# Patient Record
Sex: Female | Born: 1985 | Race: Black or African American | Hispanic: No | Marital: Single | Smoking: Never smoker
Health system: Southern US, Community
[De-identification: ages and names within clinical notes are randomized; demographics above are authoritative.]

## PROBLEM LIST (undated history)

## (undated) ENCOUNTER — Inpatient Hospital Stay (HOSPITAL_COMMUNITY): Payer: Self-pay

## (undated) DIAGNOSIS — C801 Malignant (primary) neoplasm, unspecified: Secondary | ICD-10-CM

## (undated) DIAGNOSIS — I10 Essential (primary) hypertension: Secondary | ICD-10-CM

## (undated) DIAGNOSIS — E079 Disorder of thyroid, unspecified: Secondary | ICD-10-CM

## (undated) DIAGNOSIS — F419 Anxiety disorder, unspecified: Secondary | ICD-10-CM

---
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
## 2005-12-30 ENCOUNTER — Emergency Department (HOSPITAL_COMMUNITY): Admission: EM | Admit: 2005-12-30 | Discharge: 2005-12-30 | Payer: Self-pay | Admitting: Emergency Medicine

## 2006-01-28 ENCOUNTER — Emergency Department (HOSPITAL_COMMUNITY): Admission: EM | Admit: 2006-01-28 | Discharge: 2006-01-28 | Payer: Self-pay | Admitting: Emergency Medicine

## 2006-02-07 ENCOUNTER — Emergency Department (HOSPITAL_COMMUNITY): Admission: EM | Admit: 2006-02-07 | Discharge: 2006-02-07 | Payer: Self-pay | Admitting: Emergency Medicine

## 2006-02-14 ENCOUNTER — Emergency Department (HOSPITAL_COMMUNITY): Admission: EM | Admit: 2006-02-14 | Discharge: 2006-02-15 | Payer: Self-pay | Admitting: Emergency Medicine

## 2006-02-20 ENCOUNTER — Emergency Department (HOSPITAL_COMMUNITY): Admission: EM | Admit: 2006-02-20 | Discharge: 2006-02-20 | Payer: Self-pay | Admitting: Emergency Medicine

## 2006-05-10 ENCOUNTER — Emergency Department (HOSPITAL_COMMUNITY): Admission: EM | Admit: 2006-05-10 | Discharge: 2006-05-10 | Payer: Self-pay | Admitting: Family Medicine

## 2006-06-18 ENCOUNTER — Emergency Department (HOSPITAL_COMMUNITY): Admission: EM | Admit: 2006-06-18 | Discharge: 2006-06-18 | Payer: Self-pay | Admitting: Emergency Medicine

## 2006-09-13 ENCOUNTER — Emergency Department (HOSPITAL_COMMUNITY): Admission: EM | Admit: 2006-09-13 | Discharge: 2006-09-13 | Payer: Self-pay | Admitting: Emergency Medicine

## 2006-10-19 ENCOUNTER — Emergency Department (HOSPITAL_COMMUNITY): Admission: EM | Admit: 2006-10-19 | Discharge: 2006-10-19 | Payer: Self-pay | Admitting: Family Medicine

## 2006-10-25 ENCOUNTER — Emergency Department (HOSPITAL_COMMUNITY): Admission: EM | Admit: 2006-10-25 | Discharge: 2006-10-25 | Payer: Self-pay | Admitting: Emergency Medicine

## 2007-01-15 ENCOUNTER — Emergency Department (HOSPITAL_COMMUNITY): Admission: EM | Admit: 2007-01-15 | Discharge: 2007-01-15 | Payer: Self-pay | Admitting: Family Medicine

## 2007-06-12 ENCOUNTER — Emergency Department (HOSPITAL_COMMUNITY): Admission: EM | Admit: 2007-06-12 | Discharge: 2007-06-13 | Payer: Self-pay | Admitting: Emergency Medicine

## 2007-07-05 ENCOUNTER — Emergency Department (HOSPITAL_COMMUNITY): Admission: EM | Admit: 2007-07-05 | Discharge: 2007-07-05 | Payer: Self-pay | Admitting: Emergency Medicine

## 2007-09-10 ENCOUNTER — Emergency Department (HOSPITAL_COMMUNITY): Admission: EM | Admit: 2007-09-10 | Discharge: 2007-09-10 | Payer: Self-pay | Admitting: Family Medicine

## 2007-12-14 ENCOUNTER — Emergency Department (HOSPITAL_COMMUNITY): Admission: EM | Admit: 2007-12-14 | Discharge: 2007-12-14 | Payer: Self-pay | Admitting: Emergency Medicine

## 2007-12-22 ENCOUNTER — Emergency Department (HOSPITAL_COMMUNITY): Admission: EM | Admit: 2007-12-22 | Discharge: 2007-12-22 | Payer: Self-pay | Admitting: Family Medicine

## 2008-01-24 ENCOUNTER — Emergency Department (HOSPITAL_COMMUNITY): Admission: EM | Admit: 2008-01-24 | Discharge: 2008-01-24 | Payer: Self-pay | Admitting: Emergency Medicine

## 2008-03-02 ENCOUNTER — Emergency Department (HOSPITAL_COMMUNITY): Admission: EM | Admit: 2008-03-02 | Discharge: 2008-03-02 | Payer: Self-pay | Admitting: Emergency Medicine

## 2008-03-03 ENCOUNTER — Observation Stay (HOSPITAL_COMMUNITY): Admission: EM | Admit: 2008-03-03 | Discharge: 2008-03-03 | Payer: Self-pay | Admitting: Family Medicine

## 2008-06-17 ENCOUNTER — Emergency Department (HOSPITAL_COMMUNITY): Admission: EM | Admit: 2008-06-17 | Discharge: 2008-06-17 | Payer: Self-pay | Admitting: Emergency Medicine

## 2009-04-27 ENCOUNTER — Emergency Department (HOSPITAL_COMMUNITY): Admission: EM | Admit: 2009-04-27 | Discharge: 2009-04-27 | Payer: Self-pay | Admitting: Emergency Medicine

## 2009-11-14 ENCOUNTER — Emergency Department (HOSPITAL_COMMUNITY): Admission: EM | Admit: 2009-11-14 | Discharge: 2009-11-14 | Payer: Self-pay | Admitting: Family Medicine

## 2010-01-31 ENCOUNTER — Emergency Department (HOSPITAL_COMMUNITY): Admission: EM | Admit: 2010-01-31 | Discharge: 2010-01-31 | Payer: Self-pay | Admitting: Emergency Medicine

## 2010-06-18 ENCOUNTER — Emergency Department (HOSPITAL_COMMUNITY): Admission: EM | Admit: 2010-06-18 | Discharge: 2010-06-19 | Payer: Self-pay | Admitting: Emergency Medicine

## 2010-07-11 ENCOUNTER — Emergency Department (HOSPITAL_COMMUNITY): Admission: EM | Admit: 2010-07-11 | Discharge: 2010-04-25 | Payer: Self-pay | Admitting: Emergency Medicine

## 2010-09-27 ENCOUNTER — Inpatient Hospital Stay (INDEPENDENT_AMBULATORY_CARE_PROVIDER_SITE_OTHER)
Admission: RE | Admit: 2010-09-27 | Discharge: 2010-09-27 | Disposition: A | Discharge status: Home / Self Care | Payer: 59 | Source: Ambulatory Visit | Attending: Emergency Medicine | Admitting: Emergency Medicine

## 2010-09-27 DIAGNOSIS — K089 Disorder of teeth and supporting structures, unspecified: Secondary | ICD-10-CM

## 2010-10-15 LAB — RAPID URINE DRUG SCREEN, HOSP PERFORMED
Amphetamines: POSITIVE — AB
Barbiturates: NOT DETECTED
Benzodiazepines: NOT DETECTED
Cocaine: NOT DETECTED
Opiates: NOT DETECTED
Tetrahydrocannabinol: NOT DETECTED

## 2010-10-15 LAB — URINE MICROSCOPIC-ADD ON

## 2010-10-15 LAB — CBC
HCT: 34.3 % — ABNORMAL LOW (ref 36.0–46.0)
Hemoglobin: 11.8 g/dL — ABNORMAL LOW (ref 12.0–15.0)
MCH: 29.4 pg (ref 26.0–34.0)
MCHC: 34.4 g/dL (ref 30.0–36.0)
MCV: 85.5 fL (ref 78.0–100.0)
Platelets: 223 10*3/uL (ref 150–400)
RBC: 4.01 MIL/uL (ref 3.87–5.11)
RDW: 12.1 % (ref 11.5–15.5)
WBC: 8.9 10*3/uL (ref 4.0–10.5)

## 2010-10-15 LAB — URINALYSIS, ROUTINE W REFLEX MICROSCOPIC
Bilirubin Urine: NEGATIVE
Glucose, UA: NEGATIVE mg/dL
Ketones, ur: NEGATIVE mg/dL
Leukocytes, UA: NEGATIVE
Nitrite: POSITIVE — AB
Protein, ur: NEGATIVE mg/dL
Specific Gravity, Urine: 1.034 — ABNORMAL HIGH (ref 1.005–1.030)
Urobilinogen, UA: 1 mg/dL (ref 0.0–1.0)
pH: 6 (ref 5.0–8.0)

## 2010-10-15 LAB — BASIC METABOLIC PANEL
BUN: 9 mg/dL (ref 6–23)
CO2: 26 mEq/L (ref 19–32)
Calcium: 8.9 mg/dL (ref 8.4–10.5)
Chloride: 108 mEq/L (ref 96–112)
Creatinine, Ser: 0.66 mg/dL (ref 0.4–1.2)
GFR calc Af Amer: >60 mL/min (ref >60)
GFR calc non Af Amer: >60 mL/min (ref >60)
Glucose, Bld: 117 mg/dL — ABNORMAL HIGH (ref 70–99)
Potassium: 3.6 mEq/L (ref 3.5–5.1)
Sodium: 139 mEq/L (ref 135–145)

## 2010-10-15 LAB — DIFFERENTIAL
Lymphs Abs: 2.6 10*3/uL (ref 0.7–4.0)
Monocytes Relative: 8 % (ref 3–12)
Neutro Abs: 5.6 10*3/uL (ref 1.7–7.7)
Neutrophils Relative %: 62 % (ref 43–77)

## 2010-11-08 LAB — WET PREP, GENITAL
Trich, Wet Prep: NONE SEEN
Yeast Wet Prep HPF POC: NONE SEEN

## 2010-11-08 LAB — POCT PREGNANCY, URINE: Preg Test, Ur: NEGATIVE

## 2010-11-08 LAB — GC/CHLAMYDIA PROBE AMP, GENITAL
Chlamydia, DNA Probe: NEGATIVE
GC Probe Amp, Genital: NEGATIVE

## 2010-11-20 ENCOUNTER — Emergency Department (HOSPITAL_COMMUNITY)
Admission: EM | Admit: 2010-11-20 | Discharge: 2010-11-20 | Disposition: A | Discharge status: Home / Self Care | Payer: Medicaid Other | Attending: Emergency Medicine | Admitting: Emergency Medicine

## 2010-11-20 ENCOUNTER — Inpatient Hospital Stay (INDEPENDENT_AMBULATORY_CARE_PROVIDER_SITE_OTHER)
Admission: RE | Admit: 2010-11-20 | Discharge: 2010-11-20 | Disposition: A | Discharge status: Home / Self Care | Payer: Self-pay | Source: Ambulatory Visit | Attending: Family Medicine | Admitting: Family Medicine

## 2010-11-20 DIAGNOSIS — G40802 Other epilepsy, not intractable, without status epilepticus: Secondary | ICD-10-CM | POA: Insufficient documentation

## 2010-11-20 DIAGNOSIS — K047 Periapical abscess without sinus: Secondary | ICD-10-CM

## 2010-11-20 DIAGNOSIS — K089 Disorder of teeth and supporting structures, unspecified: Secondary | ICD-10-CM | POA: Insufficient documentation

## 2010-11-20 DIAGNOSIS — G43909 Migraine, unspecified, not intractable, without status migrainosus: Secondary | ICD-10-CM | POA: Insufficient documentation
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
## 2011-04-30 LAB — POCT URINALYSIS DIP (DEVICE)
Bilirubin Urine: NEGATIVE
Ketones, ur: NEGATIVE
pH: 6.5

## 2011-04-30 LAB — WET PREP, GENITAL
Trich, Wet Prep: NONE SEEN
Yeast Wet Prep HPF POC: NONE SEEN

## 2011-04-30 LAB — URINE CULTURE

## 2011-05-02 LAB — COMPREHENSIVE METABOLIC PANEL
AST: 11
Albumin: 3.7
Alkaline Phosphatase: 63
BUN: 8
GFR calc Af Amer: >60
Potassium: 3.7
Sodium: 135
Total Protein: 6.6

## 2011-05-02 LAB — CBC
Hemoglobin: 12.3
RBC: 4.07
RDW: 12.6
WBC: 6.6

## 2011-05-02 LAB — BASIC METABOLIC PANEL
BUN: 10
CO2: 25
Calcium: 9.2
Chloride: 102
Creatinine, Ser: 0.65
GFR calc Af Amer: >60

## 2011-05-02 LAB — URINALYSIS, ROUTINE W REFLEX MICROSCOPIC
Glucose, UA: NEGATIVE
Hgb urine dipstick: NEGATIVE
Ketones, ur: 15 — AB
Protein, ur: NEGATIVE
Urobilinogen, UA: 1

## 2011-05-02 LAB — POCT I-STAT, CHEM 8
BUN: 10
Calcium, Ion: 1.11 — ABNORMAL LOW
Creatinine, Ser: 1
Hemoglobin: 12.9
TCO2: 24

## 2011-05-02 LAB — DIFFERENTIAL
Eosinophils Absolute: 0.1
Lymphs Abs: 1.8
Monocytes Relative: 8
Neutrophils Relative %: 64

## 2011-05-02 LAB — POCT PREGNANCY, URINE: Preg Test, Ur: NEGATIVE

## 2011-05-02 LAB — CK TOTAL AND CKMB (NOT AT ARMC)
CK, MB: 0.8
Total CK: 72

## 2011-05-02 LAB — ANA: Anti Nuclear Antibody(ANA): POSITIVE — AB

## 2011-05-02 LAB — ANTI-NUCLEAR AB-TITER (ANA TITER)

## 2011-05-06 LAB — URINE MICROSCOPIC-ADD ON

## 2011-05-06 LAB — CBC
HCT: 36.5
MCHC: 34.4
MCV: 89
Platelets: 243
RBC: 4.1
WBC: 8.6

## 2011-05-06 LAB — URINALYSIS, ROUTINE W REFLEX MICROSCOPIC
Glucose, UA: NEGATIVE
Ketones, ur: NEGATIVE
Leukocytes, UA: NEGATIVE
pH: 6.5

## 2011-05-06 LAB — DIFFERENTIAL
Basophils Absolute: 0
Basophils Relative: 0
Lymphs Abs: 2.9
Monocytes Absolute: 0.8

## 2011-05-06 LAB — BASIC METABOLIC PANEL
BUN: 14
CO2: 27
Chloride: 107
Potassium: 4

## 2011-05-09 ENCOUNTER — Inpatient Hospital Stay (INDEPENDENT_AMBULATORY_CARE_PROVIDER_SITE_OTHER)
Admission: RE | Admit: 2011-05-09 | Discharge: 2011-05-09 | Disposition: A | Discharge status: Home / Self Care | Payer: Self-pay | Source: Ambulatory Visit | Attending: Emergency Medicine | Admitting: Emergency Medicine

## 2011-05-09 DIAGNOSIS — K089 Disorder of teeth and supporting structures, unspecified: Secondary | ICD-10-CM

## 2011-05-13 LAB — D-DIMER, QUANTITATIVE: D-Dimer, Quant: 0.34

## 2011-05-13 LAB — I-STAT 8, (EC8 V) (CONVERTED LAB)
Acid-base deficit: 3 — ABNORMAL HIGH
Bicarbonate: 22.9
Chloride: 108
HCT: 37
Operator id: 234501
TCO2: 24
pCO2, Ven: 41.2 — ABNORMAL LOW
pH, Ven: 7.352 — ABNORMAL HIGH

## 2011-05-13 LAB — POCT I-STAT CREATININE
Creatinine, Ser: 0.8
Operator id: 234501

## 2011-05-13 LAB — POCT CARDIAC MARKERS: Troponin i, poc: <0.05

## 2011-05-22 LAB — POCT URINALYSIS DIP (DEVICE)
Glucose, UA: NEGATIVE
Ketones, ur: NEGATIVE
Nitrite: NEGATIVE
Operator id: 116391
Protein, ur: 30 — AB
Specific Gravity, Urine: >=1.03
Urobilinogen, UA: 1
pH: 6

## 2011-05-22 LAB — POCT PREGNANCY, URINE
Operator id: 116391
Preg Test, Ur: POSITIVE

## 2011-10-18 ENCOUNTER — Encounter (HOSPITAL_COMMUNITY): Payer: Self-pay

## 2011-10-18 ENCOUNTER — Emergency Department (INDEPENDENT_AMBULATORY_CARE_PROVIDER_SITE_OTHER)
Admission: EM | Admit: 2011-10-18 | Discharge: 2011-10-18 | Disposition: A | Discharge status: Home / Self Care | Payer: Self-pay | Source: Home / Self Care | Attending: Emergency Medicine | Admitting: Emergency Medicine

## 2011-10-18 DIAGNOSIS — IMO0002 Reserved for concepts with insufficient information to code with codable children: Secondary | ICD-10-CM

## 2011-10-18 DIAGNOSIS — S239XXA Sprain of unspecified parts of thorax, initial encounter: Secondary | ICD-10-CM

## 2011-10-18 DIAGNOSIS — H579 Unspecified disorder of eye and adnexa: Secondary | ICD-10-CM

## 2011-10-18 MED ORDER — IBUPROFEN 800 MG PO TABS: 800.0000 mg | ORAL_TABLET | Freq: Three times a day (TID) | ORAL | Status: AC

## 2011-10-18 MED ORDER — CYCLOBENZAPRINE HCL 10 MG PO TABS: 10.0000 mg | ORAL_TABLET | Freq: Two times a day (BID) | ORAL | Status: AC | PRN

## 2011-10-18 NOTE — Discharge Instructions (Signed)
Visual Disturbances   You have had a disturbance in your vision. This may be caused by various conditions, such as:   Migraines. Migraine headaches are often preceded by a disturbance in vision. Blind spots or light flashes are followed by a headache. This type of visual disturbance is temporary. It does not damage the eye.   Glaucoma. This is caused by increased pressure in the eye. Symptoms include haziness, blurred vision, or seeing rainbow colored circles when looking at bright lights. Partial or complete visual loss can occur. You may or may not experience eye pain. Visual loss may be gradual or sudden and is irreversible. Glaucoma is the leading cause of blindness.   Retina problems. Vision will be reduced if the retina becomes detached or if there is a circulation problem as with diabetes, high blood pressure, or a mini-stroke. Symptoms include seeing "floaters," flashes of light, or shadows, as if a curtain has fallen over your eye.   Optic nerve problems. The main nerve in your eye can be damaged by redness, soreness, and swelling (inflammation), poor circulation, drugs, and toxins.  It is very important to have a complete exam done by a specialist to determine the exact cause of your eye problem. The specialist may recommend medicines or surgery, depending on the cause of the problem. This can help prevent further loss of vision or reduce the risk of having a stroke. Contact the caregiver to whom you have been referred and arrange for follow-up care right away.   SEEK IMMEDIATE MEDICAL CARE IF:   Your vision gets worse.   You develop severe headaches.   You have any weakness or numbness in the face, arms, or legs.   You have any trouble speaking or walking.  Document Released: 08/28/2004 Document Revised: 07/10/2011 Document Reviewed: 12/19/2009   [employer] Patient Information 2012 [employer].
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
## 2012-02-02 DIAGNOSIS — C801 Malignant (primary) neoplasm, unspecified: Secondary | ICD-10-CM

## 2012-02-02 HISTORY — PX: THYROIDECTOMY: SHX17

## 2012-02-02 HISTORY — DX: Malignant (primary) neoplasm, unspecified: C80.1

## 2012-04-15 ENCOUNTER — Other Ambulatory Visit (HOSPITAL_COMMUNITY): Payer: Self-pay | Admitting: Endocrinology

## 2012-04-15 DIAGNOSIS — C73 Malignant neoplasm of thyroid gland: Secondary | ICD-10-CM

## 2012-04-19 ENCOUNTER — Other Ambulatory Visit (HOSPITAL_COMMUNITY): Payer: Self-pay | Admitting: Endocrinology

## 2012-04-19 DIAGNOSIS — C73 Malignant neoplasm of thyroid gland: Secondary | ICD-10-CM

## 2012-04-23 ENCOUNTER — Ambulatory Visit (HOSPITAL_COMMUNITY)
Admission: RE | Admit: 2012-04-23 | Discharge: 2012-04-23 | Disposition: A | Discharge status: Home / Self Care | Payer: Medicaid Other | Source: Ambulatory Visit | Attending: Endocrinology | Admitting: Endocrinology

## 2012-04-23 DIAGNOSIS — C73 Malignant neoplasm of thyroid gland: Secondary | ICD-10-CM | POA: Insufficient documentation

## 2012-04-23 MED ORDER — SODIUM IODIDE I 131 CAPSULE
52.0000 | Freq: Once | INTRAVENOUS | Status: AC | PRN
  Administered 2012-04-23: 52 via ORAL

## 2012-05-04 ENCOUNTER — Encounter (HOSPITAL_COMMUNITY)
Admission: RE | Admit: 2012-05-04 | Discharge: 2012-05-04 | Disposition: A | Discharge status: Home / Self Care | Payer: Medicaid Other | Source: Ambulatory Visit | Attending: Endocrinology | Admitting: Endocrinology

## 2012-05-04 DIAGNOSIS — C73 Malignant neoplasm of thyroid gland: Secondary | ICD-10-CM | POA: Insufficient documentation

## 2013-05-26 ENCOUNTER — Encounter (HOSPITAL_COMMUNITY): Payer: Self-pay | Admitting: Emergency Medicine

## 2013-05-26 ENCOUNTER — Emergency Department (HOSPITAL_COMMUNITY)
Admission: EM | Admit: 2013-05-26 | Discharge: 2013-05-26 | Disposition: A | Discharge status: Home / Self Care | Payer: Medicaid Other | Source: Home / Self Care | Attending: Family Medicine | Admitting: Family Medicine

## 2013-05-26 DIAGNOSIS — M545 Low back pain: Secondary | ICD-10-CM

## 2013-05-26 HISTORY — DX: Malignant (primary) neoplasm, unspecified: C80.1

## 2013-05-26 HISTORY — DX: Disorder of thyroid, unspecified: E07.9

## 2013-05-26 LAB — POCT URINALYSIS DIP (DEVICE)
Bilirubin Urine: NEGATIVE
Ketones, ur: NEGATIVE mg/dL
Leukocytes, UA: NEGATIVE
Nitrite: NEGATIVE
pH: 6 (ref 5.0–8.0)

## 2013-05-26 LAB — POCT PREGNANCY, URINE: Preg Test, Ur: NEGATIVE

## 2013-05-26 MED ORDER — MELOXICAM 15 MG PO TABS: 15.0000 mg | ORAL_TABLET | Freq: Every day | ORAL | Status: DC | PRN

## 2013-05-26 MED ORDER — CYCLOBENZAPRINE HCL 10 MG PO TABS: 10.0000 mg | ORAL_TABLET | Freq: Every evening | ORAL | Status: DC | PRN

## 2013-05-26 NOTE — ED Provider Notes (Signed)
[name] is a 27 y.o. female who presents to Urgent Care today for right low back pain. Pain started today about 10:00 in the morning without injury. She notes moderate low back pain worse with intensity. She denies any radiating pain weakness numbness bowel bladder dysfunction or difficulty walking. She notes urinary frequency but denies any abdominal pain dysuria or urgency. She denies any vaginal discharge. Her last period was about one week ago. No fevers or chills.    Past Medical History  Diagnosis Date  . Thyroid disease   . Cancer 02/2012    thyroid cancer   History  Substance Use Topics  . Smoking status: Never Smoker   . Smokeless tobacco: Not on file  . Alcohol Use: No   ROS as above Medications reviewed. No current facility-administered medications for this encounter.   Current Outpatient Prescriptions  Medication Sig Dispense Refill  . levothyroxine (SYNTHROID, LEVOTHROID) 200 MCG tablet Take 200 mcg by mouth daily before breakfast.      . phentermine 37.5 MG capsule Take 37.5 mg by mouth every morning.      . cyclobenzaprine (FLEXERIL) 10 MG tablet Take 1 tablet (10 mg total) by mouth at bedtime as needed for muscle spasms.  20 tablet  0  . meloxicam (MOBIC) 15 MG tablet Take 1 tablet (15 mg total) by mouth daily as needed for pain.  30 tablet  0    Exam:  BP 143/83  Pulse 101  Temp(Src) 97.5 F (36.4 C) (Oral)  Resp 16  SpO2 100%  LMP 05/19/2013 Gen: Well NAD HEENT: EOMI,  MMM Lungs: CTABL Nl WOB Heart: RRR no MRG Abd: NABS, NT, ND Exts: Non edematous BL  LE, warm and well perfused.  Back: Nontender spinal midline. Tender palpation right SI joint. Negative straight leg raise test ,and  Negative Faber test laterally. Gait is normal. Patient is able to stand and get on and off exam table by herself.  Results for orders placed during the hospital encounter of 05/26/13 (from the past 24 hour(s))  POCT URINALYSIS DIP (DEVICE)     Status: Abnormal   Collection Time    05/26/13  7:41 PM      Result Value Range   Glucose, UA NEGATIVE  NEGATIVE mg/dL   Bilirubin Urine NEGATIVE  NEGATIVE   Ketones, ur NEGATIVE  NEGATIVE mg/dL   Specific Gravity, Urine >=1.030  1.005 - 1.030   Hgb urine dipstick TRACE (*) NEGATIVE   pH 6.0  5.0 - 8.0   Protein, ur NEGATIVE  NEGATIVE mg/dL   Urobilinogen, UA 1.0  0.0 - 1.0 mg/dL   Nitrite NEGATIVE  NEGATIVE   Leukocytes, UA NEGATIVE  NEGATIVE  POCT PREGNANCY, URINE     Status: None   Collection Time    05/26/13  7:45 PM      Result Value Range   Preg Test, Ur NEGATIVE  NEGATIVE   No results found.  Assessment and Plan: 27 y.o. female with lumbago versus right SI joint dysfunction. Plan to treat with meloxicam and Flexeril. Additionally use heating pad and home exercise program Followup the sports medicine if not improving Discussed warning signs or symptoms. Please see discharge instructions. Patient expresses understanding.      Rodolph Bong, MD 05/26/13 [phone]

## 2013-05-26 NOTE — ED Notes (Signed)
R lower back pain without radiation @ 1000.  No dysuria but does have urinary frequency.  No chills or fever.  No hematuria.  No vaginal discharge.

## 2013-08-09 ENCOUNTER — Emergency Department (HOSPITAL_COMMUNITY): Payer: Medicaid Other

## 2013-08-09 ENCOUNTER — Encounter (HOSPITAL_COMMUNITY): Payer: Self-pay | Admitting: Emergency Medicine

## 2013-08-09 ENCOUNTER — Emergency Department (HOSPITAL_COMMUNITY)
Admission: EM | Admit: 2013-08-09 | Discharge: 2013-08-09 | Discharge status: Against Medical Advice | Payer: Medicaid Other | Attending: Emergency Medicine | Admitting: Emergency Medicine

## 2013-08-09 DIAGNOSIS — R079 Chest pain, unspecified: Secondary | ICD-10-CM | POA: Insufficient documentation

## 2013-08-09 LAB — CBC
HEMATOCRIT: 39 % (ref 36.0–46.0)
Hemoglobin: 13.4 g/dL (ref 12.0–15.0)
MCH: 29.7 pg (ref 26.0–34.0)
MCHC: 34.4 g/dL (ref 30.0–36.0)
MCV: 86.5 fL (ref 78.0–100.0)
Platelets: 271 10*3/uL (ref 150–400)
RBC: 4.51 MIL/uL (ref 3.87–5.11)
RDW: 12.3 % (ref 11.5–15.5)
WBC: 9.3 10*3/uL (ref 4.0–10.5)

## 2013-08-09 LAB — BASIC METABOLIC PANEL
BUN: 15 mg/dL (ref 6–23)
CHLORIDE: 101 meq/L (ref 96–112)
CO2: 26 meq/L (ref 19–32)
CREATININE: 0.67 mg/dL (ref 0.50–1.10)
Calcium: 9 mg/dL (ref 8.4–10.5)
GFR calc non Af Amer: >90 mL/min (ref >90)
Glucose, Bld: 80 mg/dL (ref 70–99)
Potassium: 4.2 mEq/L (ref 3.7–5.3)
Sodium: 140 mEq/L (ref 137–147)

## 2013-08-09 LAB — POCT I-STAT TROPONIN I: Troponin i, poc: 0 ng/mL (ref 0.00–0.08)

## 2013-08-09 LAB — POCT PREGNANCY, URINE: Preg Test, Ur: NEGATIVE

## 2013-08-09 NOTE — ED Notes (Signed)
Pt is here with chest pain, left arm pain, and left finger numbness that started today

## 2013-08-09 NOTE — ED Notes (Signed)
Patient approached desk and reported that she was feeling better and that she was going home. Encouraged patient to stay, patient refused.

## 2013-08-15 ENCOUNTER — Encounter (HOSPITAL_COMMUNITY): Payer: Self-pay | Admitting: Emergency Medicine

## 2013-08-15 ENCOUNTER — Emergency Department (HOSPITAL_COMMUNITY)
Admission: EM | Admit: 2013-08-15 | Discharge: 2013-08-15 | Disposition: A | Discharge status: Home / Self Care | Payer: Medicaid Other | Source: Home / Self Care | Attending: Emergency Medicine | Admitting: Emergency Medicine

## 2013-08-15 DIAGNOSIS — I7381 Erythromelalgia: Secondary | ICD-10-CM

## 2013-08-15 MED ORDER — TRAMADOL HCL 50 MG PO TABS: 100.0000 mg | ORAL_TABLET | Freq: Three times a day (TID) | ORAL | Status: DC | PRN

## 2013-08-15 MED ORDER — PREDNISONE 20 MG PO TABS: ORAL_TABLET | ORAL | Status: DC

## 2013-08-15 MED ORDER — PREDNISONE 20 MG PO TABS
60.0000 mg | ORAL_TABLET | Freq: Once | ORAL | Status: AC
  Administered 2013-08-15: 60 mg via ORAL

## 2013-08-15 MED ORDER — PREDNISONE 20 MG PO TABS
ORAL_TABLET | ORAL | Status: AC
  Filled 2013-08-15: qty 3

## 2013-08-15 NOTE — Discharge Instructions (Signed)
Take meds as directed, use moisturizer on hands.   Take Aspirin 650 mg 4 times daily.

## 2013-08-15 NOTE — ED Provider Notes (Signed)
Chief Complaint:   Chief Complaint  Patient presents with  . Hand Problem    History of Present Illness:   [name] is a 28 year old female who about 4 hours prior to presentation developed erythema and pain on the palms of both of her hands. She denies any precipitating factors. There were no obvious contactants. No medication, food, or drink. Her hands did not get overheated, nor did she experience any trauma to the hands. This only involves the palms of the hands. She has no lesions on her feet, she denies any lesions elsewhere on her body. She has no pain in her wrist or her joints. This has never happened to her before.  Review of Systems:  Other than noted above, the patient denies any of the following symptoms: Systemic:  No fevers, chills, or sweats.  No fatigue or tiredness. Musculoskeletal:  No joint pain, arthritis, bursitis, swelling, back pain, or neck pain.  Neurological:  No muscular weakness, paresthesias.  [place]:  Past medical history, family history, social history, meds, and allergies were reviewed.  No history of gout.   Physical Exam:   Vital signs:  BP 137/93  Pulse 84  Temp(Src) 98.3 F (36.8 C) (Oral)  Resp 18  SpO2 100%  LMP 08/04/2013 Gen:  Alert and oriented times 3.  In no distress. Musculoskeletal:  Exam of the hand reveals [place] erythema and tenderness to palpation, there was no rash.  All joints had a full a ROM with no swelling, deformity, or pain to palpation.  No edema, pulses full. Extremities were warm and pink.  Capillary refill was brisk.  Skin:  Clear, warm and dry.  No rash. Neuro:  Alert and oriented times 3.  Muscle strength was normal.  Sensation was intact to light touch.   Course in Urgent Care Center:   Given prednisone 60 mg by mouth.  Assessment:  The encounter diagnosis was Erythromelalgia.  Plan:   1.  Meds:  The following meds were prescribed:   Discharge Medication List as of 08/15/2013  3:59 PM    START taking these  medications   Details  predniSONE (DELTASONE) 20 MG tablet 3 daily for 5 days, 2 daily for 5 days, 1 daily for 5 days, Normal    traMADol (ULTRAM) 50 MG tablet Take 2 tablets (100 mg total) by mouth every 8 (eight) hours as needed., Starting 08/15/2013, Until Discontinued, Normal        2.  Patient Education/Counseling:  The patient was given appropriate handouts, self care instructions, and instructed in symptomatic relief, including rest and activity, elevation, application of ice and compression. Suggested aspirin, 650 mg 4 times daily.  3.  Follow up:  The patient was told to follow up if no better in 3 to 4 days, if becoming worse in any way, and given some red flag symptoms such as worsening pain or neurological symptoms which would prompt immediate return.  Follow up with Dr. Nena Polio in one week if no improvement.      Harden Mo, MD 08/15/13 [phone]

## 2013-08-15 NOTE — ED Notes (Signed)
Pt   Reports      Pain  In  Both      Hands        denys  Any  Injury   She  Reports  Pain   Came  spontanosly            About  sev  Hours  Ago            No known causative  Agent   Child  Reports  She  Washes  Hands  Frequently          Works  In day  Care

## 2013-08-19 ENCOUNTER — Other Ambulatory Visit (HOSPITAL_COMMUNITY): Payer: Self-pay | Admitting: Endocrinology

## 2013-08-19 DIAGNOSIS — C73 Malignant neoplasm of thyroid gland: Secondary | ICD-10-CM

## 2013-09-11 ENCOUNTER — Emergency Department (HOSPITAL_COMMUNITY)
Admission: EM | Admit: 2013-09-11 | Discharge: 2013-09-11 | Disposition: A | Discharge status: Home / Self Care | Payer: Medicaid Other | Source: Home / Self Care | Attending: Emergency Medicine | Admitting: Emergency Medicine

## 2013-09-11 ENCOUNTER — Emergency Department (INDEPENDENT_AMBULATORY_CARE_PROVIDER_SITE_OTHER): Payer: Medicaid Other

## 2013-09-11 ENCOUNTER — Encounter (HOSPITAL_COMMUNITY): Payer: Self-pay | Admitting: Emergency Medicine

## 2013-09-11 DIAGNOSIS — R059 Cough, unspecified: Secondary | ICD-10-CM

## 2013-09-11 DIAGNOSIS — R05 Cough: Secondary | ICD-10-CM

## 2013-09-11 LAB — CBC WITH DIFFERENTIAL/PLATELET
BASOS ABS: 0 10*3/uL (ref 0.0–0.1)
Basophils Relative: 0 % (ref 0–1)
Eosinophils Absolute: 0.1 10*3/uL (ref 0.0–0.7)
Eosinophils Relative: 1 % (ref 0–5)
HCT: 37 % (ref 36.0–46.0)
Hemoglobin: 12.9 g/dL (ref 12.0–15.0)
LYMPHS PCT: 25 % (ref 12–46)
Lymphs Abs: 2.5 10*3/uL (ref 0.7–4.0)
MCH: 29.6 pg (ref 26.0–34.0)
MCHC: 34.9 g/dL (ref 30.0–36.0)
MCV: 84.9 fL (ref 78.0–100.0)
Monocytes Absolute: 1 10*3/uL (ref 0.1–1.0)
Monocytes Relative: 10 % (ref 3–12)
Neutro Abs: 6.4 10*3/uL (ref 1.7–7.7)
Neutrophils Relative %: 64 % (ref 43–77)
Platelets: 251 10*3/uL (ref 150–400)
RBC: 4.36 MIL/uL (ref 3.87–5.11)
RDW: 12.2 % (ref 11.5–15.5)
WBC: 10 10*3/uL (ref 4.0–10.5)

## 2013-09-11 LAB — POCT I-STAT, CHEM 8
BUN: 16 mg/dL (ref 6–23)
CHLORIDE: 107 meq/L (ref 96–112)
Calcium, Ion: 1.27 mmol/L — ABNORMAL HIGH (ref 1.12–1.23)
Creatinine, Ser: 0.8 mg/dL (ref 0.50–1.10)
GLUCOSE: 96 mg/dL (ref 70–99)
HEMATOCRIT: 41 % (ref 36.0–46.0)
Hemoglobin: 13.9 g/dL (ref 12.0–15.0)
Potassium: 3.3 mEq/L — ABNORMAL LOW (ref 3.7–5.3)
Sodium: 143 mEq/L (ref 137–147)
TCO2: 21 mmol/L (ref 0–100)

## 2013-09-11 LAB — D-DIMER, QUANTITATIVE: D-Dimer, Quant: 0.38 ug/mL-FEU (ref 0.00–0.48)

## 2013-09-11 MED ORDER — ALBUTEROL SULFATE HFA 108 (90 BASE) MCG/ACT IN AERS: 2.0000 | INHALATION_SPRAY | RESPIRATORY_TRACT | Status: DC | PRN

## 2013-09-11 MED ORDER — IPRATROPIUM-ALBUTEROL 0.5-2.5 (3) MG/3ML IN SOLN
3.0000 mL | Freq: Once | RESPIRATORY_TRACT | Status: AC
  Administered 2013-09-11: 3 mL via RESPIRATORY_TRACT

## 2013-09-11 MED ORDER — HYDROCOD POLST-CHLORPHEN POLST 10-8 MG/5ML PO LQCR: 5.0000 mL | Freq: Two times a day (BID) | ORAL | Status: DC | PRN

## 2013-09-11 MED ORDER — METHYLPREDNISOLONE 4 MG PO KIT: PACK | ORAL | Status: DC

## 2013-09-11 MED ORDER — IPRATROPIUM-ALBUTEROL 0.5-2.5 (3) MG/3ML IN SOLN
RESPIRATORY_TRACT | Status: AC
  Filled 2013-09-11: qty 3

## 2013-09-11 NOTE — ED Provider Notes (Signed)
CSN: [id number]     Arrival date & time 09/11/13  1219 History   First MD Initiated Contact with Patient 09/11/13 1324     Chief Complaint  Patient presents with  . Facial Pain   (Consider location/radiation/quality/duration/timing/severity/associated sxs/prior Treatment) HPI Comments: Patient presents complaining of cough and leg pain. This is been done on for about a week now she was seen by her primary care physician 4 days ago and prescribed amoxicillin. Her cough is only getting worse since starting the amoxicillin. The cough is constant and is keeping her up at night. She is also having some shortness of breath on exertion and some mild pain in her chest with coughing. She has been having leg pain for the weeks. It is equal bilaterally. She has no swelling. She feels this pain in her knees. She believes this is because she is overweight. No history of DVT or PE. No recent travel. She does not take any hormonal contraceptives. She does not smoke   Past Medical History  Diagnosis Date  . Thyroid disease   . Cancer 02/2012    thyroid cancer   Past Surgical History  Procedure Laterality Date  . Thyroidectomy  02/2012    total  . Cesarean section  2006   Family History  Problem Relation Age of Onset  . Hypertension Mother   . Blindness Mother     one eye   History  Substance Use Topics  . Smoking status: Never Smoker   . Smokeless tobacco: Not on file  . Alcohol Use: No   OB History   Grav Para Term Preterm Abortions TAB SAB Ect Mult Living                 Review of Systems  Constitutional: Negative for fever and chills.  HENT: Positive for congestion, rhinorrhea and sore throat. Negative for sinus pressure.   Eyes: Negative for visual disturbance.  Respiratory: Positive for cough, chest tightness and shortness of breath. Negative for wheezing.   Cardiovascular: Positive for chest pain (mild, only with coughing). Negative for palpitations and leg swelling.   Gastrointestinal: Negative for nausea, vomiting and abdominal pain.  Endocrine: Negative for polydipsia and polyuria.  Genitourinary: Negative for dysuria, urgency and frequency.  Musculoskeletal: Negative for arthralgias and myalgias.  Skin: Negative for rash.  Neurological: Positive for headaches. Negative for dizziness, weakness and light-headedness.    Allergies  Review of patient's allergies indicates no known allergies.  Home Medications   Current Outpatient Rx  Name  Route  Sig  Dispense  Refill  . albuterol (PROVENTIL HFA;VENTOLIN HFA) 108 (90 BASE) MCG/ACT inhaler   Inhalation   Inhale 2 puffs into the lungs every 4 (four) hours as needed for wheezing or shortness of breath.   1 Inhaler   0   . chlorpheniramine-HYDROcodone (TUSSIONEX PENNKINETIC ER) 10-8 MG/5ML LQCR   Oral   Take 5 mLs by mouth every 12 (twelve) hours as needed for cough.   115 mL   0   . levothyroxine (SYNTHROID, LEVOTHROID) 200 MCG tablet   Oral   Take 200 mcg by mouth daily before breakfast. Takes 259mcg daily         . levothyroxine (SYNTHROID, LEVOTHROID) 25 MCG tablet   Oral   Take 25 mcg by mouth daily before breakfast. Takes 289mcg daily         . methylPREDNISolone (MEDROL DOSEPAK) 4 MG tablet      follow package directions   21 tablet  0     Dispense as written.   . predniSONE (DELTASONE) 20 MG tablet      3 daily for 5 days, 2 daily for 5 days, 1 daily for 5 days   30 tablet   0   . Pseudoeph-Doxylamine-DM-APAP (NYQUIL MULTI-SYMPTOM PO)   Oral   Take 2 capsules by mouth daily as needed (for cold).         . traMADol (ULTRAM) 50 MG tablet   Oral   Take 2 tablets (100 mg total) by mouth every 8 (eight) hours as needed.   30 tablet   0    BP 168/108  Pulse 122  Temp(Src) 98.9 F (37.2 C) (Oral)  Resp 26  SpO2 99%  LMP 08/22/2013 Physical Exam  Nursing note and vitals reviewed. Constitutional: She is oriented to person, place, and time. Vital signs are  normal. She appears well-developed and well-nourished. No distress.  HENT:  Head: Normocephalic and atraumatic.  Right Ear: External ear normal.  Left Ear: External ear normal.  Nose: Nose normal.  Mouth/Throat: Oropharynx is clear and moist. No oropharyngeal exudate.  Eyes: Conjunctivae are normal. Right eye exhibits no discharge. Left eye exhibits no discharge.  Neck: Normal range of motion. Neck supple.  Cardiovascular: Regular rhythm and normal heart sounds.  Tachycardia present.   Pulmonary/Chest: Effort normal and breath sounds normal. Not tachypneic (Respiratory rate is 16). No respiratory distress.  Lymphadenopathy:    She has no cervical adenopathy.  Neurological: She is alert and oriented to person, place, and time. She has normal strength. Coordination normal.  Skin: Skin is warm and dry. No rash noted. She is not diaphoretic.  Psychiatric: She has a normal mood and affect. Judgment normal.    ED Course  Procedures (including critical care time) Labs Review Labs Reviewed  POCT I-STAT, CHEM 8 - Abnormal; Notable for the following:    Potassium 3.3 (*)    Calcium, Ion 1.27 (*)    All other components within normal limits  CBC WITH DIFFERENTIAL  D-DIMER, QUANTITATIVE   Imaging Review Dg Chest 2 View  09/11/2013   CLINICAL DATA:  Cough  EXAM: CHEST  2 VIEW  COMPARISON:  08/09/2013  FINDINGS: The heart size and mediastinal contours are within normal limits. Both lungs are clear. The visualized skeletal structures are unremarkable.  IMPRESSION: No active cardiopulmonary disease.   Electronically Signed   By: Inez Catalina M.D.   On: 09/11/2013 13:52      MDM   1. Cough    Given the tachycardia and leg pain, we will get a d-dimer just to rule out PE. D-dimer is negative. Significant symptomatic improvement with the DuoNeb. We will discharge her with albuterol, Medrol Dosepak, Tussionex. Followup if not improving   Meds ordered this encounter  Medications  .  ipratropium-albuterol (DUONEB) 0.5-2.5 (3) MG/3ML nebulizer solution 3 mL    Sig:   . chlorpheniramine-HYDROcodone (TUSSIONEX PENNKINETIC ER) 10-8 MG/5ML LQCR    Sig: Take 5 mLs by mouth every 12 (twelve) hours as needed for cough.    Dispense:  115 mL    Refill:  0    Order Specific Question:  Supervising Provider    Answer:  Lynne Leader, [place]  . methylPREDNISolone (MEDROL DOSEPAK) 4 MG tablet    Sig: follow package directions    Dispense:  21 tablet    Refill:  0    Order Specific Question:  Supervising Provider    Answer:  Lynne Leader, [place]  .  albuterol (PROVENTIL HFA;VENTOLIN HFA) 108 (90 BASE) MCG/ACT inhaler    Sig: Inhale 2 puffs into the lungs every 4 (four) hours as needed for wheezing or shortness of breath.    Dispense:  1 Inhaler    Refill:  0    Order Specific Question:  Supervising Provider    Answer:  Lynne Leader, [place]     Liam Graham, PA-C 09/11/13 2120

## 2013-09-11 NOTE — ED Notes (Signed)
C/o cough and sinus States cough is little productive with green mucous stated seen pcp and was given antibiotics.  Only on day 4 of 10 day course.  Does have headache.

## 2013-09-11 NOTE — Discharge Instructions (Signed)

## 2013-09-12 NOTE — ED Provider Notes (Signed)
Medical screening examination/treatment/procedure(s) were performed by non-physician practitioner and as supervising physician I was immediately available for consultation/collaboration.  Philipp Deputy, M.D.   Harden Mo, MD 09/12/13 1011

## 2013-09-26 ENCOUNTER — Encounter (HOSPITAL_COMMUNITY)
Admission: RE | Admit: 2013-09-26 | Discharge: 2013-09-26 | Disposition: A | Discharge status: Home / Self Care | Payer: Medicaid Other | Source: Ambulatory Visit | Attending: Endocrinology | Admitting: Endocrinology

## 2013-09-26 DIAGNOSIS — C73 Malignant neoplasm of thyroid gland: Secondary | ICD-10-CM | POA: Insufficient documentation

## 2013-09-26 MED ORDER — THYROTROPIN ALFA 1.1 MG IM SOLR
0.9000 mg | INTRAMUSCULAR | Status: AC
  Administered 2013-09-26: 0.9 mg via INTRAMUSCULAR

## 2013-09-27 ENCOUNTER — Encounter (HOSPITAL_COMMUNITY): Payer: Medicaid Other

## 2013-09-28 ENCOUNTER — Encounter (HOSPITAL_COMMUNITY): Admission: RE | Admit: 2013-09-28 | Payer: Medicaid Other | Source: Ambulatory Visit

## 2013-09-30 ENCOUNTER — Encounter (HOSPITAL_COMMUNITY): Payer: Medicaid Other

## 2013-09-30 ENCOUNTER — Encounter (HOSPITAL_COMMUNITY): Payer: Self-pay | Admitting: *Deleted

## 2013-09-30 ENCOUNTER — Inpatient Hospital Stay (HOSPITAL_COMMUNITY)
Admission: AD | Admit: 2013-09-30 | Discharge: 2013-09-30 | Disposition: A | Discharge status: Home / Self Care | Payer: Medicaid Other | Source: Ambulatory Visit | Attending: Obstetrics & Gynecology | Admitting: Obstetrics & Gynecology

## 2013-09-30 DIAGNOSIS — Z3200 Encounter for pregnancy test, result unknown: Secondary | ICD-10-CM

## 2013-09-30 DIAGNOSIS — Z3201 Encounter for pregnancy test, result positive: Secondary | ICD-10-CM | POA: Insufficient documentation

## 2013-09-30 DIAGNOSIS — R634 Abnormal weight loss: Secondary | ICD-10-CM | POA: Insufficient documentation

## 2013-09-30 DIAGNOSIS — C73 Malignant neoplasm of thyroid gland: Secondary | ICD-10-CM | POA: Insufficient documentation

## 2013-09-30 LAB — HCG, QUANTITATIVE, PREGNANCY: hCG, Beta Chain, Quant, S: 498 m[IU]/mL — ABNORMAL HIGH (ref <5)

## 2013-09-30 LAB — POCT PREGNANCY, URINE: Preg Test, Ur: POSITIVE — AB

## 2013-09-30 NOTE — MAU Note (Signed)
Patient states she is taking HCG injections weekly for weight loss. States she has not had a period since 1-19 and has a history of irregular periods. States she always has a positive pregnancy test since she takes the HCG. States she had an ultrasound a couple of weeks ago that did not show a pregnancy. Patient states she has thyroid cancer and need radiation treatment this week but would not do it because she might be pregnant. States she is sleeping all the time. Denies pain, bleeding.

## 2013-09-30 NOTE — MAU Provider Note (Signed)
HPI:  Ms. [name] is a 28 y.o. female G1P1001 who presents to MAU for a pregnancy test. Pt is currently receving HCG injections (weekly) for weight loss. She is receiving the medication from a weight loss clinic and injects the medication herself. The last time she took an injection was 1 week ago. Pt is also receiving radiation for history of thyroid CA within the [facility], although no recent records found. Pt went to have radiation done and had a positive pregnancy test and they would not continue with the procedure. Pt had an US done at [facility] Dr. Gabriel Carina last Monday following the positive pregnancy test and they saw nothing in her uterus.  Patient denies pain or bleeding at this time. Pt would like to know if she is pregnant because this is not a desired pregnancy.   Objective:  GENERAL: Well-developed, well-nourished female in no acute distress.  HEENT: Normocephalic, atraumatic.   LUNGS: Effort normal HEART: Regular rate  SKIN: Warm, dry and without erythema PSYCH: Normal mood and affect  Filed Vitals:   09/30/13 1448  BP: 137/86  Pulse: 99  Temp: 98.4 F (36.9 C)  Resp: 20   Results for orders placed during the hospital encounter of 09/30/13 (from the past 48 hour(s))  POCT PREGNANCY, URINE     Status: Abnormal   Collection Time    09/30/13  2:28 PM      Result Value Ref Range   Preg Test, Ur POSITIVE (*) NEGATIVE   Comment:            THE SENSITIVITY OF THIS     METHODOLOGY IS >24 mIU/mL  HCG, QUANTITATIVE, PREGNANCY     Status: Abnormal   Collection Time    09/30/13  3:03 PM      Result Value Ref Range   hCG, Beta Chain, Quant, S 498 (*) <5 mIU/mL   Comment:              GEST. AGE      CONC.  (mIU/mL)       <=1 WEEK        5 - 50         2 WEEKS       50 - 500         3 WEEKS       100 - 10,000         4 WEEKS     1,000 - 30,000         5 WEEKS     3,500 - 115,000       6-8 WEEKS     12,000 - 270,000        12 WEEKS     15,000 - 220,000         FEMALE AND NON-PREGNANT FEMALE:         LESS THAN 5 mIU/mL     MDM Urine pregnancy test positive Quant  Consulted with Dr. Hulan Fray. Likely Hcg level is elevated due to Hcg injections. Pt is instructed not to take any other Hcg injections; we will bring her back Sunday for repeat beta hcg level.   A:  Elevated Hcg level; pt receiving hcg injections for weight loss   P;  Discharge home in stable condition Return in 48 hours for repeat beta hcg Return to MAU with any pain or vaginal bleeding  Darrelyn Hillock Rasch, NP 09/30/2013 5:30 PM

## 2013-10-10 ENCOUNTER — Ambulatory Visit (HOSPITAL_COMMUNITY): Payer: Medicaid Other

## 2013-10-10 ENCOUNTER — Encounter (HOSPITAL_COMMUNITY)
Admission: RE | Admit: 2013-10-10 | Discharge: 2013-10-10 | Disposition: A | Discharge status: Home / Self Care | Payer: Medicaid Other | Source: Ambulatory Visit | Attending: Endocrinology | Admitting: Endocrinology

## 2013-10-10 DIAGNOSIS — C73 Malignant neoplasm of thyroid gland: Secondary | ICD-10-CM | POA: Insufficient documentation

## 2013-10-10 MED ORDER — THYROTROPIN ALFA 1.1 MG IM SOLR
0.9000 mg | INTRAMUSCULAR | Status: AC
  Administered 2013-10-10: 0.9 mg via INTRAMUSCULAR

## 2013-10-11 ENCOUNTER — Encounter (HOSPITAL_COMMUNITY)
Admission: RE | Admit: 2013-10-11 | Discharge: 2013-10-11 | Disposition: A | Discharge status: Home / Self Care | Payer: Medicaid Other | Source: Ambulatory Visit | Attending: Endocrinology | Admitting: Endocrinology

## 2013-10-11 MED ORDER — THYROTROPIN ALFA 1.1 MG IM SOLR
0.9000 mg | INTRAMUSCULAR | Status: AC
  Administered 2013-10-11: 0.9 mg via INTRAMUSCULAR

## 2013-10-12 ENCOUNTER — Encounter (HOSPITAL_COMMUNITY)
Admission: RE | Admit: 2013-10-12 | Discharge: 2013-10-12 | Disposition: A | Discharge status: Home / Self Care | Payer: Medicaid Other | Source: Ambulatory Visit | Attending: Endocrinology | Admitting: Endocrinology

## 2013-10-12 LAB — HCG, SERUM, QUALITATIVE: Preg, Serum: NEGATIVE

## 2013-10-12 MED ORDER — SODIUM IODIDE I 131 CAPSULE
4.0000 | Freq: Once | INTRAVENOUS | Status: AC | PRN
  Administered 2013-10-12: 4 via ORAL

## 2013-10-14 ENCOUNTER — Encounter (HOSPITAL_COMMUNITY)
Admission: RE | Admit: 2013-10-14 | Discharge: 2013-10-14 | Disposition: A | Discharge status: Home / Self Care | Payer: Medicaid Other | Source: Ambulatory Visit | Attending: Endocrinology | Admitting: Endocrinology

## 2013-10-14 LAB — THYROGLOBULIN LEVEL

## 2013-10-14 LAB — THYROGLOBULIN ANTIBODY

## 2013-10-14 MED ORDER — SODIUM IODIDE I 131 CAPSULE: 4.0000 | Freq: Once | INTRAVENOUS | Status: AC | PRN

## 2013-11-09 ENCOUNTER — Emergency Department (INDEPENDENT_AMBULATORY_CARE_PROVIDER_SITE_OTHER)
Admission: EM | Admit: 2013-11-09 | Discharge: 2013-11-09 | Disposition: A | Discharge status: Home / Self Care | Payer: Self-pay | Source: Home / Self Care | Attending: Family Medicine | Admitting: Family Medicine

## 2013-11-09 ENCOUNTER — Other Ambulatory Visit (HOSPITAL_COMMUNITY)
Admission: RE | Admit: 2013-11-09 | Discharge: 2013-11-09 | Disposition: A | Discharge status: Home / Self Care | Payer: Self-pay | Source: Ambulatory Visit | Attending: Family Medicine | Admitting: Family Medicine

## 2013-11-09 ENCOUNTER — Encounter (HOSPITAL_COMMUNITY): Payer: Self-pay | Admitting: Emergency Medicine

## 2013-11-09 DIAGNOSIS — N760 Acute vaginitis: Secondary | ICD-10-CM

## 2013-11-09 DIAGNOSIS — Z113 Encounter for screening for infections with a predominantly sexual mode of transmission: Secondary | ICD-10-CM | POA: Insufficient documentation

## 2013-11-09 LAB — POCT URINALYSIS DIP (DEVICE)
Bilirubin Urine: NEGATIVE
Glucose, UA: NEGATIVE mg/dL
Ketones, ur: NEGATIVE mg/dL
LEUKOCYTES UA: NEGATIVE
Nitrite: NEGATIVE
PROTEIN: NEGATIVE mg/dL
Specific Gravity, Urine: >=1.03 (ref 1.005–1.030)
UROBILINOGEN UA: 0.2 mg/dL (ref 0.0–1.0)
pH: 5.5 (ref 5.0–8.0)

## 2013-11-09 NOTE — ED Provider Notes (Signed)
Medical screening examination/treatment/procedure(s) were performed by resident physician or non-physician practitioner and as supervising physician I was immediately available for consultation/collaboration.   Pauline Good MD.   Billy Fischer, MD 11/09/13 2129

## 2013-11-09 NOTE — ED Provider Notes (Signed)
CSN: [id number]     Arrival date & time 11/09/13  1808 History   First MD Initiated Contact with Patient 11/09/13 1955     Chief Complaint  Patient presents with  . Vaginitis   (Consider location/radiation/quality/duration/timing/severity/associated sxs/prior Treatment) Patient is a 28 y.o. female presenting with vaginal discharge. The history is provided by the patient.  Vaginal Discharge Quality:  Thick and white Severity:  Moderate Onset quality:  Gradual Duration:  3 weeks Timing:  Constant Progression:  Unchanged Chronicity:  Recurrent   Past Medical History  Diagnosis Date  . Thyroid disease   . Cancer 02/2012    thyroid cancer   Past Surgical History  Procedure Laterality Date  . Thyroidectomy  02/2012    total  . Cesarean section  2006   Family History  Problem Relation Age of Onset  . Hypertension Mother   . Blindness Mother     one eye   History  Substance Use Topics  . Smoking status: Never Smoker   . Smokeless tobacco: Not on file  . Alcohol Use: No   OB History   Grav Para Term Preterm Abortions TAB SAB Ect Mult Living   1 1 1       1      Review of Systems  Genitourinary: Positive for vaginal discharge.  All other systems reviewed and are negative.   Allergies  Review of patient's allergies indicates no known allergies.  Home Medications   Current Outpatient Rx  Name  Route  Sig  Dispense  Refill  . albuterol (PROVENTIL HFA;VENTOLIN HFA) 108 (90 BASE) MCG/ACT inhaler   Inhalation   Inhale 2 puffs into the lungs every 4 (four) hours as needed for wheezing or shortness of breath.   1 Inhaler   0   . levothyroxine (SYNTHROID, LEVOTHROID) 200 MCG tablet   Oral   Take 200 mcg by mouth daily before breakfast. Takes 26mcg daily         . phentermine 37.5 MG capsule   Oral   Take 37.5 mg by mouth every morning.         Marland Kitchen PRESCRIPTION MEDICATION      once a week. HCG injections weekly at clinic         . topiramate (TOPAMAX) 200 MG  tablet   Oral   Take 200 mg by mouth daily.         Marland Kitchen levothyroxine (SYNTHROID, LEVOTHROID) 25 MCG tablet   Oral   Take 25 mcg by mouth daily before breakfast. Takes 260mcg daily          BP 145/97  Pulse 102  Temp(Src) 98.9 F (37.2 C) (Oral)  Resp 20  SpO2 100%  LMP 10/19/2013 Physical Exam  Nursing note and vitals reviewed. Constitutional: She is oriented to person, place, and time. She appears well-developed and well-nourished. No distress.  +obese  HENT:  Head: Normocephalic and atraumatic.  Eyes: Conjunctivae are normal. No scleral icterus.  Cardiovascular: Normal rate.   Pulmonary/Chest: Effort normal.  Abdominal: Soft. Bowel sounds are normal. She exhibits no distension. There is no tenderness.  Genitourinary: Vagina normal and uterus normal. Pelvic exam was performed with patient supine. There is no rash, tenderness or lesion on the right labia. There is no rash, tenderness or lesion on the left labia. Cervix exhibits no motion tenderness, no discharge and no friability. Right adnexum displays no mass, no tenderness and no fullness. Left adnexum displays no mass, no tenderness and no fullness.  Musculoskeletal: Normal  range of motion.  Neurological: She is alert and oriented to person, place, and time.  Skin: Skin is warm and dry. No rash noted.  Psychiatric: She has a normal mood and affect.    ED Course  Procedures (including critical care time) Labs Review Labs Reviewed  POCT URINALYSIS DIP (DEVICE) - Abnormal; Notable for the following:    Hgb urine dipstick TRACE (*)    All other components within normal limits  CERVICOVAGINAL ANCILLARY ONLY   Imaging Review No results found.   MDM   1. Vaginitis   UA normal. Pelvic exam unremarkable. Will await results of swabs and initiate treatment based upon results. Patient made aware that she will receive phone call if results indicate need for treatment.    [place], [place] 11/09/13 2128

## 2013-11-09 NOTE — ED Notes (Signed)
C/o white clumpy vaginal discharge since her radiation tx. 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  Infection R great toe onset 1 week ago and started bleeding today.

## 2013-11-09 NOTE — Discharge Instructions (Signed)
Your urine studies were normal. We should have the results of your vaginal swabs tomorrow and if the results indicate the need for treatment, you will be notified by phone and we can call in any necessary prescriptions to your pharmacy.   Vaginitis Vaginitis is an inflammation of the vagina. It is most often caused by a change in the normal balance of the bacteria and yeast that live in the vagina. This change in balance causes an overgrowth of certain bacteria or yeast, which causes the inflammation. There are different types of vaginitis, but the most common types are:  Bacterial vaginosis.  Yeast infection (candidiasis).  Trichomoniasis vaginitis. This is a sexually transmitted infection (STI).  Viral vaginitis.  Atropic vaginitis.  Allergic vaginitis. CAUSES  The cause depends on the type of vaginitis. Vaginitis can be caused by:  Bacteria (bacterial vaginosis).  Yeast (yeast infection).  A parasite (trichomoniasis vaginitis)  A virus (viral vaginitis).  Low hormone levels (atrophic vaginitis). Low hormone levels can occur during pregnancy, breastfeeding, or after menopause.  Irritants, such as bubble baths, scented tampons, and feminine sprays (allergic vaginitis). Other factors can change the normal balance of the yeast and bacteria that live in the vagina. These include:  Antibiotic medicines.  Poor hygiene.  Diaphragms, vaginal sponges, spermicides, birth control pills, and intrauterine devices (IUD).  Sexual intercourse.  Infection.  Uncontrolled diabetes.  A weakened immune system. SYMPTOMS  Symptoms can vary depending on the cause of the vaginitis. Common symptoms include:  Abnormal vaginal discharge.  The discharge is white, gray, or yellow with bacterial vaginosis.  The discharge is thick, white, and cheesy with a yeast infection.  The discharge is frothy and yellow or greenish with trichomoniasis.  A bad vaginal odor.  The odor is fishy with  bacterial vaginosis.  Vaginal itching, pain, or swelling.  Painful intercourse.  Pain or burning when urinating. Sometimes, there are no symptoms. TREATMENT  Treatment will vary depending on the type of infection.   Bacterial vaginosis and trichomoniasis are often treated with antibiotic creams or pills.  Yeast infections are often treated with antifungal medicines, such as vaginal creams or suppositories.  Viral vaginitis has no cure, but symptoms can be treated with medicines that relieve discomfort. Your sexual partner should be treated as well.  Atrophic vaginitis may be treated with an estrogen cream, pill, suppository, or vaginal ring. If vaginal dryness occurs, lubricants and moisturizing creams may help. You may be told to avoid scented soaps, sprays, or douches.  Allergic vaginitis treatment involves quitting the use of the product that is causing the problem. Vaginal creams can be used to treat the symptoms. HOME CARE INSTRUCTIONS   Take all medicines as directed by your caregiver.  Keep your genital area clean and dry. Avoid soap and only rinse the area with water.  Avoid douching. It can remove the healthy bacteria in the vagina.  Do not use tampons or have sexual intercourse until your vaginitis has been treated. Use sanitary pads while you have vaginitis.  Wipe from front to back. This avoids the spread of bacteria from the rectum to the vagina.  Let air reach your genital area.  Wear cotton underwear to decrease moisture buildup.  Avoid wearing underwear while you sleep until your vaginitis is gone.  Avoid tight pants and underwear or nylons without a cotton panel.  Take off wet clothing (especially bathing suits) as soon as possible.  Use mild, non-scented products. Avoid using irritants, such as:  Scented feminine sprays.  Fabric softeners.  Scented detergents.  Scented tampons.  Scented soaps or bubble baths.  Practice safe sex and use condoms.  Condoms may prevent the spread of trichomoniasis and viral vaginitis. SEEK MEDICAL CARE IF:   You have abdominal pain.  You have a fever or persistent symptoms for more than 2 3 days.  You have a fever and your symptoms suddenly get worse. Document Released: 05/18/2007 Document Revised: 04/14/2012 Document Reviewed: 01/01/2012 [facility] Patient Information 2014 [place].

## 2013-11-10 LAB — CERVICOVAGINAL ANCILLARY ONLY
Chlamydia: NEGATIVE
NEISSERIA GONORRHEA: NEGATIVE
WET PREP (BD AFFIRM): NEGATIVE
Wet Prep (BD Affirm): NEGATIVE
Wet Prep (BD Affirm): POSITIVE — AB

## 2013-11-10 MED ORDER — METRONIDAZOLE 500 MG PO TABS: 500.0000 mg | ORAL_TABLET | Freq: Two times a day (BID) | ORAL | Status: DC

## 2013-11-14 ENCOUNTER — Telehealth (HOSPITAL_COMMUNITY): Payer: Self-pay | Admitting: *Deleted

## 2013-11-14 NOTE — ED Notes (Signed)
GC/Chlamydia neg., Affirm: Candida and Trich neg., Gardnerella pos.  4/9 Message sent to [facility] PA.  She e-prescribed Flagyl to the [facility] at [address]  4/13 I called pt. Pt. verified x 2 and given results.  Pt. told she needs Flagyl for bacterial vaginosis and where to pick up her Rx.    Pt. instructed to no alcohol while taking this medication.  Pt.'s questions about bacterial vaginosis answered. Hanley Seamen [facility] 11/14/2013

## 2013-12-01 ENCOUNTER — Encounter (HOSPITAL_COMMUNITY): Payer: Self-pay | Admitting: Emergency Medicine

## 2013-12-01 ENCOUNTER — Emergency Department (HOSPITAL_COMMUNITY)
Admission: EM | Admit: 2013-12-01 | Discharge: 2013-12-01 | Disposition: A | Discharge status: Home / Self Care | Payer: Self-pay | Attending: Emergency Medicine | Admitting: Emergency Medicine

## 2013-12-01 DIAGNOSIS — K529 Noninfective gastroenteritis and colitis, unspecified: Secondary | ICD-10-CM

## 2013-12-01 DIAGNOSIS — K5289 Other specified noninfective gastroenteritis and colitis: Secondary | ICD-10-CM | POA: Insufficient documentation

## 2013-12-01 DIAGNOSIS — Z79899 Other long term (current) drug therapy: Secondary | ICD-10-CM | POA: Insufficient documentation

## 2013-12-01 DIAGNOSIS — E079 Disorder of thyroid, unspecified: Secondary | ICD-10-CM | POA: Insufficient documentation

## 2013-12-01 DIAGNOSIS — R42 Dizziness and giddiness: Secondary | ICD-10-CM | POA: Insufficient documentation

## 2013-12-01 DIAGNOSIS — E0540 Thyrotoxicosis factitia without thyrotoxic crisis or storm: Secondary | ICD-10-CM

## 2013-12-01 DIAGNOSIS — E0580 Other thyrotoxicosis without thyrotoxic crisis or storm: Secondary | ICD-10-CM | POA: Insufficient documentation

## 2013-12-01 DIAGNOSIS — Z85850 Personal history of malignant neoplasm of thyroid: Secondary | ICD-10-CM | POA: Insufficient documentation

## 2013-12-01 DIAGNOSIS — R110 Nausea: Secondary | ICD-10-CM | POA: Insufficient documentation

## 2013-12-01 LAB — COMPREHENSIVE METABOLIC PANEL
ALT: 11 U/L (ref 0–35)
AST: 15 U/L (ref 0–37)
Albumin: 3.9 g/dL (ref 3.5–5.2)
Alkaline Phosphatase: 67 U/L (ref 39–117)
BUN: 18 mg/dL (ref 6–23)
CALCIUM: 8.9 mg/dL (ref 8.4–10.5)
CO2: 20 meq/L (ref 19–32)
CREATININE: 0.71 mg/dL (ref 0.50–1.10)
Chloride: 104 mEq/L (ref 96–112)
GLUCOSE: 94 mg/dL (ref 70–99)
Potassium: 4.2 mEq/L (ref 3.7–5.3)
SODIUM: 139 meq/L (ref 137–147)
TOTAL PROTEIN: 7.8 g/dL (ref 6.0–8.3)
Total Bilirubin: 0.9 mg/dL (ref 0.3–1.2)

## 2013-12-01 LAB — CBC WITH DIFFERENTIAL/PLATELET
BASOS ABS: 0 10*3/uL (ref 0.0–0.1)
Basophils Relative: 0 % (ref 0–1)
EOS ABS: 0 10*3/uL (ref 0.0–0.7)
Eosinophils Relative: 0 % (ref 0–5)
HEMATOCRIT: 42.3 % (ref 36.0–46.0)
Hemoglobin: 14.8 g/dL (ref 12.0–15.0)
Lymphocytes Relative: 6 % — ABNORMAL LOW (ref 12–46)
Lymphs Abs: 0.6 10*3/uL — ABNORMAL LOW (ref 0.7–4.0)
MCH: 30.1 pg (ref 26.0–34.0)
MCHC: 35 g/dL (ref 30.0–36.0)
MCV: 86.2 fL (ref 78.0–100.0)
MONO ABS: 0.8 10*3/uL (ref 0.1–1.0)
Monocytes Relative: 8 % (ref 3–12)
Neutro Abs: 8.8 10*3/uL — ABNORMAL HIGH (ref 1.7–7.7)
Neutrophils Relative %: 86 % — ABNORMAL HIGH (ref 43–77)
PLATELETS: 223 10*3/uL (ref 150–400)
RBC: 4.91 MIL/uL (ref 3.87–5.11)
RDW: 12.1 % (ref 11.5–15.5)
WBC: 10.2 10*3/uL (ref 4.0–10.5)

## 2013-12-01 LAB — URINALYSIS, ROUTINE W REFLEX MICROSCOPIC
BILIRUBIN URINE: NEGATIVE
Glucose, UA: NEGATIVE mg/dL
Hgb urine dipstick: NEGATIVE
KETONES UR: NEGATIVE mg/dL
LEUKOCYTES UA: NEGATIVE
NITRITE: NEGATIVE
PH: 6.5 (ref 5.0–8.0)
Protein, ur: NEGATIVE mg/dL
Specific Gravity, Urine: 1.028 (ref 1.005–1.030)
UROBILINOGEN UA: 1 mg/dL (ref 0.0–1.0)

## 2013-12-01 LAB — I-STAT TROPONIN, ED: TROPONIN I, POC: 0 ng/mL (ref 0.00–0.08)

## 2013-12-01 LAB — TSH: TSH: 0.023 u[IU]/mL — ABNORMAL LOW (ref 0.350–4.500)

## 2013-12-01 LAB — PREGNANCY, URINE: Preg Test, Ur: NEGATIVE

## 2013-12-01 LAB — LIPASE, BLOOD: Lipase: 18 U/L (ref 11–59)

## 2013-12-01 MED ORDER — DIPHENOXYLATE-ATROPINE 2.5-0.025 MG PO TABS: 1.0000 | ORAL_TABLET | Freq: Four times a day (QID) | ORAL | Status: DC | PRN

## 2013-12-01 MED ORDER — ONDANSETRON 4 MG PO TBDP: 4.0000 mg | ORAL_TABLET | Freq: Three times a day (TID) | ORAL | Status: DC | PRN

## 2013-12-01 MED ORDER — SODIUM CHLORIDE 0.9 % IV BOLUS (SEPSIS)
1000.0000 mL | Freq: Once | INTRAVENOUS | Status: AC
  Administered 2013-12-01: 1000 mL via INTRAVENOUS

## 2013-12-01 MED ORDER — LEVOTHYROXINE SODIUM 100 MCG PO TABS: 100.0000 ug | ORAL_TABLET | Freq: Every day | ORAL | Status: DC

## 2013-12-01 NOTE — ED Provider Notes (Signed)
Face-to-face evaluation. History reviewed. Patient feeling relief. Tachycardic to 110. TSH low. With all of her significant weight loss is very likely overmedicated with thyroid supplement. Told Synthroid today. Stop phentermine. New prescription for 100 mics and dry. Followup with primary care.  I agree with Dr. Tarri Glenn assessment and plan above.  Tanna Furry, MD 12/01/13 [phone]

## 2013-12-01 NOTE — Discharge Instructions (Signed)
Stop your phentermine New dose Synthroid (155mcg) Recheck with your primary care within 7-10 days.  Viral Gastroenteritis Viral gastroenteritis is also called stomach flu. This illness is caused by a certain type of germ (virus). It can cause sudden watery poop (diarrhea) and throwing up (vomiting). This can cause you to lose body fluids (dehydration). This illness usually lasts for 3 to 8 days. It usually goes away on its own. HOME CARE   Drink enough fluids to keep your pee (urine) clear or pale yellow. Drink small amounts of fluids often.  Ask your doctor how to replace body fluid losses (rehydration).  Avoid:  Foods high in sugar.  Alcohol.  Bubbly (carbonated) drinks.  Tobacco.  Juice.  Caffeine drinks.  Very hot or cold fluids.  Fatty, greasy foods.  Eating too much at one time.  Dairy products until 24 to 48 hours after your watery poop stops.  You may eat foods with active cultures (probiotics). They can be found in some yogurts and supplements.  Wash your hands well to avoid spreading the illness.  Only take medicines as told by your doctor. Do not give aspirin to children. Do not take medicines for watery poop (antidiarrheals).  Ask your doctor if you should keep taking your regular medicines.  Keep all doctor visits as told. GET HELP RIGHT AWAY IF:   You cannot keep fluids down.  You do not pee at least once every 6 to 8 hours.  You are short of breath.  You see blood in your poop or throw up. This may look like coffee grounds.  You have belly (abdominal) pain that gets worse or is just in one small spot (localized).  You keep throwing up or having watery poop.  You have a fever.  The patient is a child younger than 3 months, and he or she has a fever.  The patient is a child older than 3 months, and he or she has a fever and problems that do not go away.  The patient is a child older than 3 months, and he or she has a fever and problems that  suddenly get worse.  The patient is a baby, and he or she has no tears when crying. MAKE SURE YOU:   Understand these instructions.  Will watch your condition.  Will get help right away if you are not doing well or get worse. Document Released: 01/07/2008 Document Revised: 10/13/2011 Document Reviewed: 05/07/2011 [facility] Patient Information 2014 [place].

## 2013-12-01 NOTE — ED Provider Notes (Signed)
CSN: [id number]     Arrival date & time 12/01/13  1908 History   First MD Initiated Contact with Patient 12/01/13 2048     Chief Complaint  Patient presents with  . Abdominal Cramping  . Nausea  . Dizziness     (Consider location/radiation/quality/duration/timing/severity/associated sxs/prior Treatment) The history is provided by the patient.   history of present illness: 28 year old female with history of thyroid cancer status post thyroidectomy 6 months ago, GERD who presents with chief complaint of abdominal cramping and nausea. Onset of symptoms was today patient reports epigastric intermittent abdominal "cramping and burning". Discomfort rated moderate severity. Nonradiating. Symptoms exacerbated by eating. No vomiting, melena, hematochezia. No lower abdominal pain. At time of exam patient reports she is symptom-free  Past Medical History  Diagnosis Date  . Thyroid disease   . Cancer 02/2012    thyroid cancer   Past Surgical History  Procedure Laterality Date  . Thyroidectomy  02/2012    total  . Cesarean section  2006   Family History  Problem Relation Age of Onset  . Hypertension Mother   . Blindness Mother     one eye   History  Substance Use Topics  . Smoking status: Never Smoker   . Smokeless tobacco: Not on file  . Alcohol Use: No   OB History   Grav Para Term Preterm Abortions TAB SAB Ect Mult Living   1 1 1       1      Review of Systems  Constitutional: Negative for fever and chills.  HENT: Negative for congestion.   Eyes: Negative for pain.  Respiratory: Negative for shortness of breath.   Cardiovascular: Negative for chest pain.  Gastrointestinal: Positive for nausea and abdominal pain. Negative for vomiting, diarrhea, constipation and blood in stool.  Genitourinary: Negative for dysuria.  Musculoskeletal: Negative for back pain.  Skin: Negative for rash and wound.  Neurological: Negative for headaches.  All other systems reviewed and are  negative.     Allergies  Review of patient's allergies indicates no known allergies.  Home Medications   Prior to Admission medications   Medication Sig Start Date End Date Taking? Authorizing Provider  albuterol (PROVENTIL HFA;VENTOLIN HFA) 108 (90 BASE) MCG/ACT inhaler Inhale 2 puffs into the lungs every 4 (four) hours as needed for wheezing or shortness of breath. 09/11/13   Liam Graham, PA-C  levothyroxine (SYNTHROID, LEVOTHROID) 200 MCG tablet Take 200 mcg by mouth daily before breakfast. Takes 217mcg daily    Historical Provider, MD  levothyroxine (SYNTHROID, LEVOTHROID) 25 MCG tablet Take 25 mcg by mouth daily before breakfast. Takes 216mcg daily    Historical Provider, MD  metroNIDAZOLE (FLAGYL) 500 MG tablet Take 1 tablet (500 mg total) by mouth 2 (two) times daily. 11/10/13   Lahoma Rocker, PA  phentermine 37.5 MG capsule Take 37.5 mg by mouth every morning.    Historical Provider, MD  PRESCRIPTION MEDICATION once a week. HCG injections weekly at clinic    Historical Provider, MD  topiramate (TOPAMAX) 200 MG tablet Take 200 mg by mouth daily.    Historical Provider, MD   BP 113/83  Pulse 112  Temp(Src) 99.3 F (37.4 C) (Oral)  Resp 28  Ht 5\' 2"  (1.575 m)  SpO2 99%  LMP 11/17/2013 Physical Exam  Nursing note and vitals reviewed. Constitutional: She is oriented to person, place, and time. She appears well-developed and well-nourished. No distress.  HENT:  Head: Normocephalic and atraumatic.  Eyes: Conjunctivae are normal.  Neck: Neck supple.  Cardiovascular: Regular rhythm, normal heart sounds and intact distal pulses.  Tachycardia present.   Pulmonary/Chest: Effort normal and breath sounds normal. She has no wheezes. She has no rales.  Abdominal: Soft. She exhibits no distension. There is no tenderness.  Musculoskeletal: Normal range of motion.  Neurological: She is alert and oriented to person, place, and time.  Skin: Skin is warm and dry.    ED Course   Procedures (including critical care time) Labs Review Labs Reviewed  CBC WITH DIFFERENTIAL - Abnormal; Notable for the following:    Neutrophils Relative % 86 (*)    Neutro Abs 8.8 (*)    Lymphocytes Relative 6 (*)    Lymphs Abs 0.6 (*)    All other components within normal limits  TSH - Abnormal; Notable for the following:    TSH 0.023 (*)    All other components within normal limits  COMPREHENSIVE METABOLIC PANEL  LIPASE, BLOOD  URINALYSIS, ROUTINE W REFLEX MICROSCOPIC  PREGNANCY, URINE  T4, FREE  I-STAT TROPOININ, ED    Imaging Review No results found.   Date: 12/02/2013  Rate: 133  Rhythm: sinus tachycardia  QRS Axis: normal  Intervals: normal  ST/T Wave abnormalities: normal  Conduction Disutrbances:none  Narrative Interpretation: Sinus tachycardia  Old EKG Reviewed: none available    MDM   Final diagnoses:  Gastroenteritis  Factitious hyperthyroidism    28 year old female with history of GERD,thyroid cancer status post thyroidectomy who presented with one day of intermittent nausea and an epigastric cramping/burning sensation exacerbated by eating. Patient tachycardic with vital signs otherwise stable presentation.  EKG showed sinus tachycardia. Regarding patient's tachycardia on further questioning she reports she's been using phentermine for weight loss. She also reports that she has lost a significant amount of weight over the course of the past 6 months since her thyroidectomy but has not had her levothyroxine adjusted. Labs obtained with TSH of 0.023. No sx concerning for PE.  Suspect patient's symptoms do to overmedication and recommend she discontinue use of the phentermine.  The patient's epigastric discomfort consistent with GERD. No history of melena or hematochezia, doubt ulcer. No right upper quadrant pain or tenderness doubt gallbladder etiology. Normal lipase and doubt pancreatitis. No lower abdominal pain or tenderness to patient doubt  appendicitis, diverticulitis, or pelvic etiology.  Labs obtained as part of triaged protocol included troponin that was negative. Patient without any concern for ACS and do not feel additional enzymes indicated.  Instructed patient to follow with her PCP as soon as possible for adjustment of her levothyroxine. Also counseled her to discontinue use of phentermine. Recommend starting PPI for suspected GERD. Return precautions given.    Renaldo Reel, MD 12/02/13 [id number]

## 2013-12-01 NOTE — ED Notes (Signed)
Fatigue, nausea, abd. Cramping since this am.

## 2013-12-02 LAB — T4, FREE: Free T4: 1.67 ng/dL (ref 0.80–1.80)

## 2013-12-05 NOTE — ED Provider Notes (Signed)
I saw and evaluated the patient, reviewed the resident's note and I agree with the findings and plan.   EKG Interpretation   Date/Time:  Thursday December 01 2013 19:51:22 EDT Ventricular Rate:  133 PR Interval:  126 QRS Duration: 84 QT Interval:  302 QTC Calculation: 449 R Axis:   19 Text Interpretation:  Sinus tachycardia Nonspecific T wave abnormality  Abnormal ECG ED PHYSICIAN INTERPRETATION AVAILABLE IN [facility]  Confirmed by TEST, Record ([id number]) on 12/03/2013 8:55:57 AM      I examine this patient face-to-face. I agree with Dr. Tarri Glenn assessment and plan. History was reviewed and with patient. Patient examined. Stable for discharge at this time. Please see my additional notation.  Tanna Furry, MD 12/05/13 [phone]

## 2014-01-27 ENCOUNTER — Encounter (HOSPITAL_COMMUNITY): Payer: Self-pay | Admitting: Emergency Medicine

## 2014-01-27 ENCOUNTER — Emergency Department (INDEPENDENT_AMBULATORY_CARE_PROVIDER_SITE_OTHER)
Admission: EM | Admit: 2014-01-27 | Discharge: 2014-01-27 | Disposition: A | Discharge status: Home / Self Care | Payer: Self-pay | Source: Home / Self Care

## 2014-01-27 DIAGNOSIS — R42 Dizziness and giddiness: Secondary | ICD-10-CM

## 2014-01-27 DIAGNOSIS — M940 Chondrocostal junction syndrome [Tietze]: Secondary | ICD-10-CM

## 2014-01-27 MED ORDER — MECLIZINE HCL 25 MG PO TABS: ORAL_TABLET | ORAL | Status: DC

## 2014-01-27 NOTE — Discharge Instructions (Signed)
Dizziness Drink plenty of water and gatorade Cut your Phentermine in half or stop it all together Dizziness is a common problem. It is a feeling of unsteadiness or light-headedness. You may feel like you are about to faint. Dizziness can lead to injury if you stumble or fall. A person of any age group can suffer from dizziness, but dizziness is more common in older adults. CAUSES  Dizziness can be caused by many different things, including:  Middle ear problems.  Standing for too long.  Infections.  An allergic reaction.  Aging.  An emotional response to something, such as the sight of blood.  Side effects of medicines.  Tiredness.  Problems with circulation or blood pressure.  Excessive use of alcohol or medicines, or illegal drug use.  Breathing too fast (hyperventilation).  An irregular heart rhythm (arrhythmia).  A low red blood cell count (anemia).  Pregnancy.  Vomiting, diarrhea, fever, or other illnesses that cause body fluid loss (dehydration).  Diseases or conditions such as Parkinson's disease, high blood pressure (hypertension), diabetes, and thyroid problems.  Exposure to extreme heat. DIAGNOSIS  Your health care provider will ask about your symptoms, perform a physical exam, and perform an electrocardiogram (ECG) to record the electrical activity of your heart. Your health care provider may also perform other heart or blood tests to determine the cause of your dizziness. These may include:  Transthoracic echocardiogram (TTE). During echocardiography, sound waves are used to evaluate how blood flows through your heart.  Transesophageal echocardiogram (TEE).  Cardiac monitoring. This allows your health care provider to monitor your heart rate and rhythm in real time.  Holter monitor. This is a portable device that records your heartbeat and can help diagnose heart arrhythmias. It allows your health care provider to track your heart activity for several days  if needed.  Stress tests by exercise or by giving medicine that makes the heart beat faster. TREATMENT  Treatment of dizziness depends on the cause of your symptoms and can vary greatly. HOME CARE INSTRUCTIONS   Drink enough fluids to keep your urine clear or pale yellow. This is especially important in very hot weather. In older adults, it is also important in cold weather.  Take your medicine exactly as directed if your dizziness is caused by medicines. When taking blood pressure medicines, it is especially important to get up slowly.  Rise slowly from chairs and steady yourself until you feel okay.  In the morning, first sit up on the side of the bed. When you feel okay, stand slowly while holding onto something until you know your balance is fine.  Move your legs often if you need to stand in one place for a long time. Tighten and relax your muscles in your legs while standing.  Have someone stay with you for 1-2 days if dizziness continues to be a problem. Do this until you feel you are well enough to stay alone. Have the person call your health care provider if he or she notices changes in you that are concerning.  Do not drive or use heavy machinery if you feel dizzy.  Do not drink alcohol. SEEK IMMEDIATE MEDICAL CARE IF:   Your dizziness or light-headedness gets worse.  You feel nauseous or vomit.  You have problems talking, walking, or using your arms, hands, or legs.  You feel weak.  You are not thinking clearly or you have trouble forming sentences. It may take a friend or family member to notice this.  You have  chest pain, abdominal pain, shortness of breath, or sweating.  Your vision changes.  You notice any bleeding.  You have side effects from medicine that seems to be getting worse rather than better. MAKE SURE YOU:   Understand these instructions.  Will watch your condition.  Will get help right away if you are not doing well or get worse. Document  Released: 01/14/2001 Document Revised: 07/26/2013 Document Reviewed: 02/07/2011 [facility] Patient Information 2015 [place], [place]. This information is not intended to replace advice given to you by your health care provider. Make sure you discuss any questions you have with your health care provider.  Chest Wall Pain Chest wall pain is pain felt in or around the chest bones and muscles. It may take up to 6 weeks to get better. It may take longer if you are active. Chest wall pain can happen on its own. Other times, things like germs, injury, coughing, or exercise can cause the pain. HOME CARE   Avoid activities that make you tired or cause pain. Try not to use your chest, belly (abdominal), or side muscles. Do not use heavy weights.  Put ice on the sore area.  Put ice in a plastic bag.  Place a towel between your skin and the bag.  Leave the ice on for 15-20 minutes for the first 2 days.  Only take medicine as told by your doctor. GET HELP RIGHT AWAY IF:   You have more pain or are very uncomfortable.  You have a fever.  Your chest pain gets worse.  You have new problems.  You feel sick to your stomach (nauseous) or throw up (vomit).  You start to sweat or feel lightheaded.  You have a cough with mucus (phlegm).  You cough up blood. MAKE SURE YOU:   Understand these instructions.  Will watch your condition.  Will get help right away if you are not doing well or get worse. Document Released: 01/07/2008 Document Revised: 10/13/2011 Document Reviewed: 03/17/2011 [facility] Patient Information 2015 [place], [place]. This information is not intended to replace advice given to you by your health care provider. Make sure you discuss any questions you have with your health care provider.  Costochondritis Costochondritis is a condition in which the tissue (cartilage) that connects your ribs with your breastbone (sternum) becomes irritated. It causes pain in the chest and rib area.  It usually goes away on its own over time. HOME CARE  Avoid activities that wear you out.  Do not strain your ribs. Avoid activities that use your:  Chest.  Belly.  Side muscles.  Put ice on the area for the first 2 days after the pain starts.  Put ice in a plastic bag.  Place a towel between your skin and the bag.  Leave the ice on for 20 minutes, 2-3 times a day.  Only take medicine as told by your doctor. GET HELP IF:  You have redness or puffiness (swelling) in the rib area.  Your pain does not go away with rest or medicine. GET HELP RIGHT AWAY IF:   Your pain gets worse.  You are very uncomfortable.  You have trouble breathing.  You cough up blood.  You start sweating or throwing up (vomiting).  You have a fever or lasting symptoms for more than 2-3 days.  You have a fever and your symptoms suddenly get worse. MAKE SURE YOU:   Understand these instructions.  Will watch your condition.  Will get help right away if you are  not doing well or get worse. Document Released: 01/07/2008 Document Revised: 03/23/2013 Document Reviewed: 02/22/2013 [facility] Patient Information 2015 [place], [place]. This information is not intended to replace advice given to you by your health care provider. Make sure you discuss any questions you have with your health care provider.

## 2014-01-27 NOTE — ED Notes (Addendum)
States she has had episodic dizziness and pain in her chest since this AM. Went back to sleep after laying down w her first dizzy spell this AM. Noted she felt dizzy at times while out shopping this AM. Pain not reproducable and is having no pain at present. NAD , w/d/color good

## 2014-01-27 NOTE — ED Provider Notes (Signed)
CSN: [id number]     Arrival date & time 01/27/14  1356 History   First MD Initiated Contact with Patient 01/27/14 1416     Chief Complaint  Patient presents with  . Dizziness   (Consider location/radiation/quality/duration/timing/severity/associated sxs/prior Treatment) HPI Comments: 28 year old severely obese female states that she got out of bed this morning and experienced dizziness upon standing. She went back to bed for a while stood up again and the dizziness returned. She was so dizzy that she almost fell down. After standing for a longer period of time the dizziness ameliorated. She drove to [employer] and walk around and got dizzy. She walk around outside and got dizzy. Her dizziness is intermittent.  Her other concern is that of "barely noticeable pain" to the left upper sternal border this AM. No syncope. Restarted Phentermine recently and has lossed her appetite, eating very little.   Past Medical History  Diagnosis Date  . Thyroid disease   . Cancer 02/2012    thyroid cancer   Past Surgical History  Procedure Laterality Date  . Thyroidectomy  02/2012    total  . Cesarean section  2006   Family History  Problem Relation Age of Onset  . Hypertension Mother   . Blindness Mother     one eye   History  Substance Use Topics  . Smoking status: Never Smoker   . Smokeless tobacco: Not on file  . Alcohol Use: No   OB History   Grav Para Term Preterm Abortions TAB SAB Ect Mult Living   1 1 1       1      Review of Systems  Constitutional: Negative for fever, activity change, appetite change and fatigue.  HENT: Negative.   Respiratory: Negative.   Cardiovascular: Positive for chest pain.  Gastrointestinal: Negative.   Genitourinary: Negative.   Musculoskeletal: Negative.   Skin: Negative.   Neurological: Positive for dizziness. Negative for tremors, seizures, syncope, facial asymmetry, speech difficulty, weakness, numbness and headaches.    Allergies  Review of  patient's allergies indicates no known allergies.  Home Medications   Prior to Admission medications   Medication Sig Start Date End Date Taking? Authorizing Zebediah Beezley  phentermine 37.5 MG capsule Take 37.5 mg by mouth every morning.   Yes Historical Kaulder Zahner, MD  topiramate (TOPAMAX) 200 MG tablet Take 200 mg by mouth every evening.    Yes Historical Elton Catalano, MD  albuterol (PROVENTIL HFA;VENTOLIN HFA) 108 (90 BASE) MCG/ACT inhaler Inhale 2 puffs into the lungs every 4 (four) hours as needed for wheezing or shortness of breath. 09/11/13   Liam Graham, PA-C  diphenoxylate-atropine (LOMOTIL) 2.5-0.025 MG per tablet Take 1 tablet by mouth 4 (four) times daily as needed for diarrhea or loose stools. 12/01/13   Tanna Furry, MD  levothyroxine (SYNTHROID, LEVOTHROID) 100 MCG tablet Take 1 tablet (100 mcg total) by mouth daily before breakfast. 12/01/13   Tanna Furry, MD  levothyroxine (SYNTHROID, LEVOTHROID) 200 MCG tablet Take 200 mcg by mouth daily before breakfast. Takes 257mcg daily    Historical Merrin Mcvicker, MD  meclizine (ANTIVERT) 25 MG tablet Take 1/2 to 1 tab q 8h prn dizziness 01/27/14   Janne Napoleon, NP  metroNIDAZOLE (FLAGYL) 500 MG tablet Take 1 tablet (500 mg total) by mouth 2 (two) times daily. 11/10/13   Lahoma Rocker, PA  ondansetron (ZOFRAN ODT) 4 MG disintegrating tablet Take 1 tablet (4 mg total) by mouth every 8 (eight) hours as needed for nausea. 12/01/13   Tanna Furry,  MD   BP 128/86  Pulse 112  Temp(Src) 97.8 F (36.6 C) (Oral)  Resp 16  SpO2 99% Physical Exam  Nursing note and vitals reviewed. Constitutional: She is oriented to person, place, and time. She appears well-developed and well-nourished. No distress.  Eyes: Conjunctivae and EOM are normal. Pupils are equal, round, and reactive to light.  Neck: Normal range of motion. Neck supple.  Cardiovascular: Regular rhythm and normal heart sounds.  Exam reveals no gallop.   No murmur heard. Apical rate 112  Pulmonary/Chest:  Effort normal and breath sounds normal. No respiratory distress. She has no wheezes.  Musculoskeletal: She exhibits no edema and no tenderness.  Lymphadenopathy:    She has no cervical adenopathy.  Neurological: She is alert and oriented to person, place, and time. No cranial nerve deficit. She exhibits normal muscle tone. Coordination normal.  Skin: Skin is warm and dry.    ED Course  Procedures (including critical care time) Labs Review Labs Reviewed - No data to display  Imaging Review No results found.   MDM   1. Dizziness   2. Costochondritis    Dizziness multifactoral. Taking Phentermine, BP and HR elevated, eating very little, has decrease appetite; taking 225 mcg thyroid replacement, query drinking sufficient amt fluids; possible insensible water loses in this heat >90 deg q d.Consider inner ear disorder. antivert 12.5-25 mg q 8h prn Lots of water/gatorade Stop or 1/2 Phentermine Eat more     Janne Napoleon, NP 01/27/14 1459

## 2014-01-31 NOTE — ED Provider Notes (Signed)
Medical screening examination/treatment/procedure(s) were performed by a resident physician or non-physician practitioner and as the supervising physician I was immediately available for consultation/collaboration.  Lynne Leader, MD   Gregor Hams, MD 01/31/14 [phone]

## 2014-02-18 ENCOUNTER — Encounter (HOSPITAL_COMMUNITY): Payer: Self-pay | Admitting: *Deleted

## 2014-02-18 ENCOUNTER — Inpatient Hospital Stay (HOSPITAL_COMMUNITY)
Admission: AD | Admit: 2014-02-18 | Discharge: 2014-02-18 | Discharge status: Against Medical Advice | Payer: Medicaid Other | Source: Ambulatory Visit | Attending: Obstetrics & Gynecology | Admitting: Obstetrics & Gynecology

## 2014-02-18 DIAGNOSIS — N898 Other specified noninflammatory disorders of vagina: Secondary | ICD-10-CM | POA: Insufficient documentation

## 2014-02-18 LAB — POCT PREGNANCY, URINE: Preg Test, Ur: NEGATIVE

## 2014-02-18 NOTE — MAU Note (Signed)
Pt stated she could not wait to be seen. Signed AMA paperwork. Refused discharge vital signs

## 2014-02-18 NOTE — MAU Note (Signed)
Brown, slimy discharge X 3 days. No pain.

## 2014-04-26 ENCOUNTER — Emergency Department (INDEPENDENT_AMBULATORY_CARE_PROVIDER_SITE_OTHER)
Admission: EM | Admit: 2014-04-26 | Discharge: 2014-04-26 | Disposition: A | Discharge status: Home / Self Care | Payer: Self-pay | Source: Home / Self Care | Attending: Family Medicine | Admitting: Family Medicine

## 2014-04-26 ENCOUNTER — Encounter (HOSPITAL_COMMUNITY): Payer: Self-pay | Admitting: Emergency Medicine

## 2014-04-26 DIAGNOSIS — S161XXA Strain of muscle, fascia and tendon at neck level, initial encounter: Secondary | ICD-10-CM

## 2014-04-26 DIAGNOSIS — S139XXA Sprain of joints and ligaments of unspecified parts of neck, initial encounter: Secondary | ICD-10-CM

## 2014-04-26 MED ORDER — IBUPROFEN 800 MG PO TABS
800.0000 mg | ORAL_TABLET | Freq: Once | ORAL | Status: AC
  Administered 2014-04-26: 800 mg via ORAL

## 2014-04-26 MED ORDER — IBUPROFEN 800 MG PO TABS
ORAL_TABLET | ORAL | Status: AC
  Filled 2014-04-26: qty 1

## 2014-04-26 MED ORDER — NAPROXEN 375 MG PO TABS: 375.0000 mg | ORAL_TABLET | Freq: Two times a day (BID) | ORAL | Status: DC

## 2014-04-26 MED ORDER — CYCLOBENZAPRINE HCL 5 MG PO TABS: 5.0000 mg | ORAL_TABLET | Freq: Three times a day (TID) | ORAL | Status: DC | PRN
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
## 2014-04-26 NOTE — ED Provider Notes (Signed)
Medical screening examination/treatment/procedure(s) were performed by a resident physician or non-physician practitioner and as the supervising physician I was immediately available for consultation/collaboration.  Linna Darner, MD Family Medicine   Waldemar Dickens, MD 04/26/14 [phone]

## 2014-05-06 ENCOUNTER — Encounter (HOSPITAL_COMMUNITY): Payer: Self-pay

## 2014-05-06 ENCOUNTER — Inpatient Hospital Stay (HOSPITAL_COMMUNITY)
Admission: AD | Admit: 2014-05-06 | Discharge: 2014-05-07 | Disposition: A | Discharge status: Home / Self Care | Payer: Medicaid Other | Source: Ambulatory Visit | Attending: Family Medicine | Admitting: Family Medicine

## 2014-05-06 DIAGNOSIS — B3731 Acute candidiasis of vulva and vagina: Secondary | ICD-10-CM

## 2014-05-06 DIAGNOSIS — B373 Candidiasis of vulva and vagina: Secondary | ICD-10-CM | POA: Insufficient documentation

## 2014-05-06 LAB — WET PREP, GENITAL
CLUE CELLS WET PREP: NONE SEEN
Trich, Wet Prep: NONE SEEN
Yeast Wet Prep HPF POC: NONE SEEN

## 2014-05-06 LAB — URINALYSIS, ROUTINE W REFLEX MICROSCOPIC
Bilirubin Urine: NEGATIVE
Glucose, UA: NEGATIVE mg/dL
Ketones, ur: NEGATIVE mg/dL
Leukocytes, UA: NEGATIVE
NITRITE: NEGATIVE
Protein, ur: NEGATIVE mg/dL
SPECIFIC GRAVITY, URINE: 1.02 (ref 1.005–1.030)
Urobilinogen, UA: 4 mg/dL — ABNORMAL HIGH (ref 0.0–1.0)
pH: 6 (ref 5.0–8.0)

## 2014-05-06 LAB — URINE MICROSCOPIC-ADD ON

## 2014-05-06 LAB — POCT PREGNANCY, URINE: PREG TEST UR: NEGATIVE

## 2014-05-06 MED ORDER — FLUCONAZOLE 150 MG PO TABS
150.0000 mg | ORAL_TABLET | Freq: Every day | ORAL | Status: DC
Start: 2014-05-06 — End: 2015-01-02

## 2014-05-06 NOTE — MAU Provider Note (Signed)
History     CSN: [id number]  Arrival date and time: 05/06/14 2109   First Provider Initiated Contact with Patient 05/06/14 2323      Chief Complaint  Patient presents with  . Vaginal Discharge   HPI Comments: [name] 28 y.o. G1P1001 presents to MAU with white vaginal discharge ongoing since Monday. She has used the OTC Monistat without relief. She denies any partner change. She started her menses today.  Vaginal Discharge The patient's primary symptoms include a vaginal discharge.      Past Medical History  Diagnosis Date  . Thyroid disease   . Cancer 02/2012    thyroid cancer    Past Surgical History  Procedure Laterality Date  . Thyroidectomy  02/2012    total  . Cesarean section  2006    Family History  Problem Relation Age of Onset  . Hypertension Mother   . Blindness Mother     one eye    History  Substance Use Topics  . Smoking status: Never Smoker   . Smokeless tobacco: Never Used  . Alcohol Use: No    Allergies: No Known Allergies  Prescriptions prior to admission  Medication Sig Dispense Refill  . levothyroxine (SYNTHROID, LEVOTHROID) 200 MCG tablet Take 200 mcg by mouth daily before breakfast.       . levothyroxine (SYNTHROID, LEVOTHROID) 25 MCG tablet Take 25 mcg by mouth daily before breakfast.      . naproxen (NAPROSYN) 375 MG tablet Take 1 tablet (375 mg total) by mouth 2 (two) times daily with a meal. As needed for pain  20 tablet  0  . phentermine 37.5 MG capsule Take 37.5 mg by mouth every morning.      . topiramate (TOPAMAX) 200 MG tablet Take 100 mg by mouth every evening.         Review of Systems  Constitutional: Negative.   HENT: Negative.   Eyes: Negative.   Respiratory: Negative.   Cardiovascular: Negative.   Gastrointestinal: Negative.   Genitourinary: Positive for vaginal discharge.       Vaginal discharge Menstrual cycle  Musculoskeletal: Negative.   Skin: Negative.   Neurological: Negative.    Psychiatric/Behavioral: Negative.    Physical Exam   Blood pressure 155/97, pulse 102, temperature 97.4 F (36.3 C), temperature source Oral, resp. rate 18, last menstrual period 04/12/2014.  Physical Exam  Constitutional: She is oriented to person, place, and time. She appears well-developed and well-nourished. No distress.  HENT:  Head: Normocephalic and atraumatic.  GI: Soft. Bowel sounds are normal. She exhibits no distension. There is no tenderness. There is no rebound.  Genitourinary:  Genital:external negative Vaginal:moderate amount blood with white clumps Cervix:closed/ thick Bimanual:nontender   Musculoskeletal: Normal range of motion.  Neurological: She is alert and oriented to person, place, and time.  Skin: Skin is warm and dry.  Psychiatric: She has a normal mood and affect. Her behavior is normal. Judgment and thought content normal.   Results for orders placed during the hospital encounter of 05/06/14 (from the past 24 hour(s))  URINALYSIS, ROUTINE W REFLEX MICROSCOPIC     Status: Abnormal   Collection Time    05/06/14  9:20 PM      Result Value Ref Range   Color, Urine YELLOW  YELLOW   APPearance CLEAR  CLEAR   Specific Gravity, Urine 1.020  1.005 - 1.030   pH 6.0  5.0 - 8.0   Glucose, UA NEGATIVE  NEGATIVE mg/dL   Hgb urine  dipstick LARGE (*) NEGATIVE   Bilirubin Urine NEGATIVE  NEGATIVE   Ketones, ur NEGATIVE  NEGATIVE mg/dL   Protein, ur NEGATIVE  NEGATIVE mg/dL   Urobilinogen, UA 4.0 (*) 0.0 - 1.0 mg/dL   Nitrite NEGATIVE  NEGATIVE   Leukocytes, UA NEGATIVE  NEGATIVE  URINE MICROSCOPIC-ADD ON     Status: Abnormal   Collection Time    05/06/14  9:20 PM      Result Value Ref Range   Squamous Epithelial / LPF FEW (*) RARE   WBC, UA 0-2  <3 WBC/hpf   RBC / HPF 7-10  <3 RBC/hpf   Bacteria, UA FEW (*) RARE   Urine-Other MUCOUS PRESENT    POCT PREGNANCY, URINE     Status: None   Collection Time    05/06/14  9:22 PM      Result Value Ref Range    Preg Test, Ur NEGATIVE  NEGATIVE  WET PREP, GENITAL     Status: Abnormal   Collection Time    05/06/14 11:25 PM      Result Value Ref Range   Yeast Wet Prep HPF POC NONE SEEN  NONE SEEN   Trich, Wet Prep NONE SEEN  NONE SEEN   Clue Cells Wet Prep HPF POC NONE SEEN  NONE SEEN   WBC, Wet Prep HPF POC FEW (*) NONE SEEN     MAU Course  Procedures  MDM Wet prep/ Gc/Chlamydia  Assessment and Plan   A: Presumptive yeast  P: Await other results Diflucan 150 mg po/ refills Needs to find GYN Follow up MAU for Emergencies  Georgia Duff 05/06/2014, 11:32 PM

## 2014-05-06 NOTE — Discharge Instructions (Signed)

## 2014-05-06 NOTE — MAU Note (Signed)
Pt presents complaining of a white thick discharge that started Monday. States she did over the counter yeast treatment but it did not help.

## 2014-05-07 LAB — HIV ANTIBODY (ROUTINE TESTING W REFLEX): HIV 1&2 Ab, 4th Generation: NONREACTIVE

## 2014-05-09 LAB — GC/CHLAMYDIA PROBE AMP
CT Probe RNA: NEGATIVE
GC PROBE AMP APTIMA: NEGATIVE

## 2014-05-10 NOTE — MAU Provider Note (Signed)
Attestation of Attending Supervision of Advanced Practitioner (PA/CNM/NP): Evaluation and management procedures were performed by the Advanced Practitioner under my supervision and collaboration.  I have reviewed the Advanced Practitioner's note and chart, and I agree with the management and plan.  Donnamae Jude, MD [facility] Attending 05/10/2014 1:46 PM

## 2014-05-15 ENCOUNTER — Encounter (HOSPITAL_COMMUNITY): Payer: Self-pay | Admitting: Emergency Medicine

## 2014-05-15 ENCOUNTER — Emergency Department (INDEPENDENT_AMBULATORY_CARE_PROVIDER_SITE_OTHER): Payer: Self-pay

## 2014-05-15 ENCOUNTER — Emergency Department (INDEPENDENT_AMBULATORY_CARE_PROVIDER_SITE_OTHER)
Admission: EM | Admit: 2014-05-15 | Discharge: 2014-05-15 | Disposition: A | Discharge status: Home / Self Care | Payer: Self-pay | Source: Home / Self Care | Attending: Emergency Medicine | Admitting: Emergency Medicine

## 2014-05-15 DIAGNOSIS — S060X0A Concussion without loss of consciousness, initial encounter: Secondary | ICD-10-CM

## 2014-05-15 DIAGNOSIS — S39012A Strain of muscle, fascia and tendon of lower back, initial encounter: Secondary | ICD-10-CM

## 2014-05-15 DIAGNOSIS — S53401A Unspecified sprain of right elbow, initial encounter: Secondary | ICD-10-CM

## 2014-05-15 DIAGNOSIS — S161XXA Strain of muscle, fascia and tendon at neck level, initial encounter: Secondary | ICD-10-CM

## 2014-05-15 DIAGNOSIS — S8391XA Sprain of unspecified site of right knee, initial encounter: Secondary | ICD-10-CM

## 2014-05-15 DIAGNOSIS — Y92410 Unspecified street and highway as the place of occurrence of the external cause: Secondary | ICD-10-CM

## 2014-05-15 MED ORDER — MELOXICAM 15 MG PO TABS: 15.0000 mg | ORAL_TABLET | Freq: Every day | ORAL | Status: DC

## 2014-05-15 MED ORDER — METHOCARBAMOL 500 MG PO TABS: 500.0000 mg | ORAL_TABLET | Freq: Three times a day (TID) | ORAL | Status: DC

## 2014-05-15 MED ORDER — HYDROCODONE-ACETAMINOPHEN 5-325 MG PO TABS
2.0000 | ORAL_TABLET | Freq: Once | ORAL | Status: AC
  Administered 2014-05-15: 2 via ORAL

## 2014-05-15 MED ORDER — HYDROCODONE-ACETAMINOPHEN 5-325 MG PO TABS: ORAL_TABLET | ORAL | Status: DC

## 2014-05-15 MED ORDER — HYDROCODONE-ACETAMINOPHEN 5-325 MG PO TABS
ORAL_TABLET | ORAL | Status: AC
  Filled 2014-05-15: qty 2
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
## 2014-05-15 NOTE — Discharge Instructions (Signed)
TREATMENT  °Treatment initially involves the use of ice and medication to help reduce pain and inflammation. It is also important to perform strengthening and stretching exercises and modify activities that worsen symptoms so the injury does not get worse. These exercises may be performed at home or with a therapist. For patients who experience severe symptoms, a soft padded collar may be recommended to be worn around the neck.  °Improving your posture may help reduce symptoms. Posture improvement includes pulling your chin and abdomen in while sitting or standing. If you are sitting, sit in a firm chair with your buttocks against the back of the chair. While sleeping, try replacing your pillow with a small towel rolled to 2 inches in diameter, or use a cervical pillow. Poor sleeping positions delay healing.  ° °MEDICATION  °· If pain medication is necessary, nonsteroidal anti-inflammatory medications, such as aspirin and ibuprofen, or other minor pain relievers, such as acetaminophen, are often recommended. °· Do not take pain medication for 7 days before surgery. °· Prescription pain relievers may be given if deemed necessary by your caregiver. Use only as directed and only as much as you need. ° °HEAT AND COLD:  °· Cold treatment (icing) relieves pain and reduces inflammation. Cold treatment should be applied for 10 to 15 minutes every 2 to 3 hours for inflammation and pain and immediately after any activity that aggravates your symptoms. Use ice packs or an ice massage. °· Heat treatment may be used prior to performing the stretching and strengthening activities prescribed by your caregiver, physical therapist, or athletic trainer. Use a heat pack or a warm soak. ° °SEEK MEDICAL CARE IF:  °· Symptoms get worse or do not improve in 2 weeks despite treatment. °· New, unexplained symptoms develop (drugs used in treatment may produce side effects). ° °EXERCISES °RANGE OF MOTION (ROM) AND STRETCHING EXERCISES -  Cervical Strain and Sprain °These exercises may help you when beginning to rehabilitate your injury. In order to successfully resolve your symptoms, you must improve your posture. These exercises are designed to help reduce the forward-head and rounded-shoulder posture which contributes to this condition. Your symptoms may resolve with or without further involvement from your physician, physical therapist or athletic trainer. While completing these exercises, remember:  °· Restoring tissue flexibility helps normal motion to return to the joints. This allows healthier, less painful movement and activity. °· An effective stretch should be held for at least 20 seconds, although you may need to begin with shorter hold times for comfort. °· A stretch should never be painful. You should only feel a gentle lengthening or release in the stretched tissue. ° °STRETCH- Axial Extensors °· Lie on your back on the floor. You may bend your knees for comfort. Place a rolled up hand towel or dish towel, about 2 inches in diameter, under the part of your head that makes contact with the floor. °· Gently tuck your chin, as if trying to make a "double chin," until you feel a gentle stretch at the base of your head. °· Hold _____10_____ seconds. °Repeat _____10_____ times. Complete this exercise _____2_____ times per day.  ° °STRETECH - Axial Extension  °· Stand or sit on a firm surface. Assume a good posture: chest up, shoulders drawn back, abdominal muscles slightly tense, knees unlocked (if standing) and feet hip width apart. °· Slowly retract your chin so your head slides back and your chin slightly lowers.Continue to look straight ahead. °· You should feel a gentle stretch   in the back of your head. Be certain not to feel an aggressive stretch since this can cause headaches later. °· Hold for ____10______ seconds. °Repeat _____10_____ times. Complete this exercise ____2______ times per day. ° °STRETCH  Cervical Side Bend  °· Stand  or sit on a firm surface. Assume a good posture: chest up, shoulders drawn back, abdominal muscles slightly tense, knees unlocked (if standing) and feet hip width apart. °· Without letting your nose or shoulders move, slowly tip your right / left ear to your shoulder until your feel a gentle stretch in the muscles on the opposite side of your neck. °· Hold _____10_____ seconds. °Repeat _____10_____ times. Complete this exercise _____2_____ times per day. ° °STRETCH  Cervical Rotators  °· Stand or sit on a firm surface. Assume a good posture: chest up, shoulders drawn back, abdominal muscles slightly tense, knees unlocked (if standing) and feet hip width apart. °· Keeping your eyes level with the ground, slowly turn your head until you feel a gentle stretch along the back and opposite side of your neck. °· Hold _____10_____ seconds. °Repeat ____10______ times. Complete this exercise ____2______ times per day. ° °RANGE OF MOTION - Neck Circles  °· Stand or sit on a firm surface. Assume a good posture: chest up, shoulders drawn back, abdominal muscles slightly tense, knees unlocked (if standing) and feet hip width apart. °· Gently roll your head down and around from the back of one shoulder to the back of the other. The motion should never be forced or painful. °· Repeat the motion 10-20 times, or until you feel the neck muscles relax and loosen. °Repeat ____10______ times. Complete the exercise _____2_____ times per day. ° °STRENGTHENING EXERCISES - Cervical Strain and Sprain °These exercises may help you when beginning to rehabilitate your injury. They may resolve your symptoms with or without further involvement from your physician, physical therapist or athletic trainer. While completing these exercises, remember:  °· Muscles can gain both the endurance and the strength needed for everyday activities through controlled exercises. °· Complete these exercises as instructed by your physician, physical therapist or  athletic trainer. Progress the resistance and repetitions only as guided. °· You may experience muscle soreness or fatigue, but the pain or discomfort you are trying to eliminate should never worsen during these exercises. If this pain does worsen, stop and make certain you are following the directions exactly. If the pain is still present after adjustments, discontinue the exercise until you can discuss the trouble with your clinician. ° °STRENGTH Cervical Flexors, Isometric °· Face a wall, standing about 6 inches away. Place a small pillow, a ball about 6-8 inches in diameter, or a folded towel between your forehead and the wall. °· Slightly tuck your chin and gently push your forehead into the soft object. Push only with mild to moderate intensity, building up tension gradually. Keep your jaw and forehead relaxed. °· Hold 10 to 20 seconds. Keep your breathing relaxed. °· Release the tension slowly. Relax your neck muscles completely before you start the next repetition. °Repeat _____10_____ times. Complete this exercise _____2_____ times per day. ° °STRENGTH- Cervical Lateral Flexors, Isometric  °· Stand about 6 inches away from a wall. Place a small pillow, a ball about 6-8 inches in diameter, or a folded towel between the side of your head and the wall. °· Slightly tuck your chin and gently tilt your head into the soft object. Push only with mild to moderate intensity, building up tension gradually. Keep   your jaw and forehead relaxed. °· Hold 10 to 20 seconds. Keep your breathing relaxed. °· Release the tension slowly. Relax your neck muscles completely before you start the next repetition. °Repeat _____10_____ times. Complete this exercise ____2______ times per day. ° °STRENGTH  Cervical Extensors, Isometric  °· Stand about 6 inches away from a wall. Place a small pillow, a ball about 6-8 inches in diameter, or a folded towel between the back of your head and the wall. °· Slightly tuck your chin and gently  tilt your head back into the soft object. Push only with mild to moderate intensity, building up tension gradually. Keep your jaw and forehead relaxed. °· Hold 10 to 20 seconds. Keep your breathing relaxed. °· Release the tension slowly. Relax your neck muscles completely before you start the next repetition. °Repeat _____10_____ times. Complete this exercise _____2_____ times per day. ° °POSTURE AND BODY MECHANICS CONSIDERATIONS - Cervical Strain and Sprain °Keeping correct posture when sitting, standing or completing your activities will reduce the stress put on different body tissues, allowing injured tissues a chance to heal and limiting painful experiences. The following are general guidelines for improved posture. Your physician or physical therapist will provide you with any instructions specific to your needs. While reading these guidelines, remember: °· The exercises prescribed by your provider will help you have the flexibility and strength to maintain correct postures. °· The correct posture provides the optimal environment for your joints to work. All of your joints have less wear and tear when properly supported by a spine with good posture. This means you will experience a healthier, less painful body. °· Correct posture must be practiced with all of your activities, especially prolonged sitting and standing. Correct posture is as important when doing repetitive low-stress activities (typing) as it is when doing a single heavy-load activity (lifting). °PROLONGED STANDING WHILE SLIGHTLY LEANING FORWARD °When completing a task that requires you to lean forward while standing in one place for a long time, place either foot up on a stationary 2-4 inch high object to help maintain the best posture. When both feet are on the ground, the low back tends to lose its slight inward curve. If this curve flattens (or becomes too large), then the back and your other joints will experience too much stress, fatigue  more quickly and can cause pain.  °RESTING POSITIONS °Consider which positions are most painful for you when choosing a resting position. If you have pain with flexion-based activities (sitting, bending, stooping, squatting), choose a position that allows you to rest in a less flexed posture. You would want to avoid curling into a fetal position on your side. If your pain worsens with extension-based activities (prolonged standing, working overhead), avoid resting in an extended position such as sleeping on your stomach. Most people will find more comfort when they rest with their spine in a more neutral position, neither too rounded nor too arched. Lying on a non-sagging bed on your side with a pillow between your knees, or on your back with a pillow under your knees will often provide some relief. Keep in mind, being in any one position for a prolonged period of time, no matter how correct your posture, can still lead to stiffness. °WALKING °Walk with an upright posture. Your ears, shoulders and hips should all line-up. °OFFICE WORK °When working at a desk, create an environment that supports good, upright posture. Without extra support, muscles fatigue and lead to excessive strain on joints and other tissues. °  CHAIR:  A chair should be able to slide under your desk when your back makes contact with the back of the chair. This allows you to work closely.  The chair's height should allow your eyes to be level with the upper part of your monitor and your hands to be slightly lower than your elbows.  Body position:  Your feet should make contact with the floor. If this is not possible, use a foot rest.  Keep your ears over your shoulders. This will reduce stress on your neck and low back. Document Released: 07/21/2005 Document Revised: 10/13/2011 Document Reviewed: 11/02/2008 [facility] Patient Information 2013 [place].  Most shoulder pain is caused by soft tissue problems rather than arthritis.   Rotator cuff tendonitis or tendonosis, rotator cuff tears, impingement syndrome and cartilege (labrum tears) are a few of the common causes of shoulder pain.  Fortunately, most of these can be treated with conservative measures as outlined below.  Do not do the following:  Doing any work with the arms above shoulder level (especially lifting) until the pain has subsided.  Sleeping on the affected side.  Especially avoid sleeping with your arm under your head or your pillow.  This is a habit that is hard to break.  Some people have to pin the arm of their pajamas to the chest area to prevent this.  Do the following:  Do the shoulder exercises below twice daily followed by ice for 10 minutes.  If no better in 1 month, follow up here, with your primary care doctor, or with an orthopedist.  Use of over the counter pain meds can be of help.  Tylenol (or acetaminophen) is the safest to use.  It often helps to take this regularly.  You can take up to 2 325 mg tablets 5 times daily, but it best to start out much lower that that, perhaps 2 325 mg tablets twice daily, then increase from there. People who are on the blood thinner warfarin have to be careful about taking high doses of Tylenol.  For people who are able to tolerate them, ibuprofen and Aleve can also help with the pain.  You should discuss these agents with your physician before taking them.  People with chronic kidney disease, hypertension, peptic ulcer disease, and reflux can suffer adverse side effects. They should not be taken with warfarin. The maximum dosage of ibuprofen is 800 mg 3 times daily with meals.  The maximum dosage of Aleve is 2 and 1/2 tablets twice daily with food, but again, start out low and gradually increase the dose until adequate pain relief is achieved. Ibuprofen and Aleve should always be taken with food.      Do exercises twice daily followed by moist heat for 15 minutes.      Try to be as active as  possible.  If no better in 2 weeks, follow up with orthopedist.  Knee pain can be caused by many conditions:  Osteoarthritis, gout, bursitis, tendonitis, cartiledge damage, condromalacia patella, patellofemoral syndrome, and ligament sprain to name just a few.  Often some simple conservative measures can help alleviate the pain.  Do not do the following:  Avoid squatting and doing deep knee bends.  This puts too much of load on your cartiledges and tendons.  If you do a knee bend, go only half way down, flexing your knee no more than 90 degrees.  Do the following:  If you are overweight or obese, lose weight.  This makes for  a lot less load on your knee joints.  If you use tobacco, quit.  Nicotine causes spasm of the small arteries, decreases blood flow, and impairs your body's normal ability to repair damage.  If your knee is acutely inflamed, use the principles of RICE (rest, ice, compression, and elevation).  Wearing a knee brace can help.  These are usually made of neoprene and can be purchased over the counter at the drug store.  Use of over the counter pain meds can be of help.  Tylenol (or acetaminophen) is the safest to use.  It often helps to take this regularly.  You can take up to 2 325 mg tablets 5 times daily, but it best to start out much lower that that, perhaps 2 325 mg tablets twice daily, then increase from there. People who are on the blood thinner warfarin have to be careful about taking high doses of Tylenol.  For people who are able to tolerate them, ibuprofen and naproxyn can also help with the pain.  You should discuss these agents with your physician before taking them.  People with chronic kidney disease, hypertension, peptic ulcer disease, and reflux can suffer adverse side effects. They should not be taken with warfarin. The maximum dosage of ibuprofen is 800 mg 3 times daily with meals.  The maximum dosage of naprosyn is 2 and 1/2 tablets twice daily with food, but  again, start out low and gradually increase the dose until adequate pain relief is achieved. Ibuprofen and naprosyn should always be taken with food.  People with cartiledge injury or osteoarthritis may find glucosamine to be helpful.  This is an over-the-counter supplement that helps nourish and repair cartiledge.  The dose is 500 mg 3 times daily or 1500 mg taken in a single dose. This can take several months to work and it doesn't always work.    For people with knee pain on just one side, use of a cane held in the hand on the same side as the knee pain takes some of the stress off the knee joint and can make a big difference in knee pain.  Wearing good shoes with adequate arch support is essential.  Regular exercise is of utmost importance.  Swimming, water aerobics, or use of an elliptical exerciser put the least stress on the knees of any exercise.  Finally doing the exercises below can be very helpful.  They tend to strengthen the muscles around the knee and provide extra support and stability.  Try to do them twice a day followed by ice for 10 minutes.
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
## 2014-06-05 ENCOUNTER — Encounter (HOSPITAL_COMMUNITY): Payer: Self-pay | Admitting: Emergency Medicine

## 2014-09-02 ENCOUNTER — Emergency Department (HOSPITAL_COMMUNITY)
Admission: EM | Admit: 2014-09-02 | Discharge: 2014-09-02 | Disposition: A | Discharge status: Home / Self Care | Payer: Self-pay | Source: Home / Self Care

## 2014-09-02 ENCOUNTER — Encounter (HOSPITAL_COMMUNITY): Payer: Self-pay | Admitting: *Deleted

## 2014-09-02 DIAGNOSIS — R51 Headache: Secondary | ICD-10-CM | POA: Insufficient documentation

## 2014-09-02 NOTE — ED Notes (Signed)
Pt in c/o headache since earlier today, checked her BP at [place] and it was elevated, states she is not on BP medication but has always had trouble with it, no distress noted

## 2014-09-03 ENCOUNTER — Emergency Department (HOSPITAL_COMMUNITY)
Admission: EM | Admit: 2014-09-03 | Discharge: 2014-09-03 | Discharge status: Against Medical Advice | Payer: Medicaid Other | Attending: Emergency Medicine | Admitting: Emergency Medicine

## 2014-09-03 NOTE — ED Notes (Signed)
No answer x3

## 2014-10-11 ENCOUNTER — Emergency Department (HOSPITAL_COMMUNITY)
Admission: EM | Admit: 2014-10-11 | Discharge: 2014-10-12 | Discharge status: Against Medical Advice | Payer: Medicaid Other | Attending: Emergency Medicine | Admitting: Emergency Medicine

## 2014-10-11 ENCOUNTER — Encounter (HOSPITAL_COMMUNITY): Payer: Self-pay | Admitting: *Deleted

## 2014-10-11 ENCOUNTER — Other Ambulatory Visit: Payer: Self-pay

## 2014-10-11 DIAGNOSIS — R002 Palpitations: Secondary | ICD-10-CM | POA: Insufficient documentation

## 2014-10-11 DIAGNOSIS — R42 Dizziness and giddiness: Secondary | ICD-10-CM | POA: Insufficient documentation

## 2014-10-11 LAB — BASIC METABOLIC PANEL
Anion gap: 6 (ref 5–15)
BUN: 17 mg/dL (ref 6–23)
CALCIUM: 8.9 mg/dL (ref 8.4–10.5)
CO2: 27 mmol/L (ref 19–32)
CREATININE: 0.68 mg/dL (ref 0.50–1.10)
Chloride: 106 mmol/L (ref 96–112)
GFR calc Af Amer: >90 mL/min (ref >90)
Glucose, Bld: 136 mg/dL — ABNORMAL HIGH (ref 70–99)
Potassium: 3.7 mmol/L (ref 3.5–5.1)
Sodium: 139 mmol/L (ref 135–145)

## 2014-10-11 LAB — I-STAT TROPONIN, ED: Troponin i, poc: 0 ng/mL (ref 0.00–0.08)

## 2014-10-11 LAB — CBC
HEMATOCRIT: 36.1 % (ref 36.0–46.0)
Hemoglobin: 12.4 g/dL (ref 12.0–15.0)
MCH: 29.4 pg (ref 26.0–34.0)
MCHC: 34.3 g/dL (ref 30.0–36.0)
MCV: 85.5 fL (ref 78.0–100.0)
PLATELETS: 286 10*3/uL (ref 150–400)
RBC: 4.22 MIL/uL (ref 3.87–5.11)
RDW: 12.4 % (ref 11.5–15.5)
WBC: 6.9 10*3/uL (ref 4.0–10.5)

## 2014-10-11 NOTE — ED Notes (Signed)
Pt reports recent GI symptoms on Sunday with n/v/d. Today had dizziness, palpitations and right side facial pain. No facial droop noted at triage.

## 2014-10-11 NOTE — ED Notes (Signed)
Pt called to take to a room with no answer x1

## 2014-10-26 ENCOUNTER — Ambulatory Visit: Payer: Medicaid Other

## 2014-12-31 ENCOUNTER — Emergency Department (HOSPITAL_COMMUNITY)
Admission: EM | Admit: 2014-12-31 | Discharge: 2014-12-31 | Disposition: A | Discharge status: Home / Self Care | Payer: Medicaid Other | Attending: Emergency Medicine | Admitting: Emergency Medicine

## 2014-12-31 ENCOUNTER — Encounter (HOSPITAL_COMMUNITY): Payer: Self-pay | Admitting: *Deleted

## 2014-12-31 DIAGNOSIS — O9989 Other specified diseases and conditions complicating pregnancy, childbirth and the puerperium: Secondary | ICD-10-CM | POA: Insufficient documentation

## 2014-12-31 DIAGNOSIS — Z3A01 Less than 8 weeks gestation of pregnancy: Secondary | ICD-10-CM | POA: Insufficient documentation

## 2014-12-31 DIAGNOSIS — R6883 Chills (without fever): Secondary | ICD-10-CM | POA: Insufficient documentation

## 2014-12-31 DIAGNOSIS — Z7982 Long term (current) use of aspirin: Secondary | ICD-10-CM | POA: Diagnosis not present

## 2014-12-31 DIAGNOSIS — Z79899 Other long term (current) drug therapy: Secondary | ICD-10-CM | POA: Diagnosis not present

## 2014-12-31 DIAGNOSIS — Z85850 Personal history of malignant neoplasm of thyroid: Secondary | ICD-10-CM | POA: Diagnosis not present

## 2014-12-31 DIAGNOSIS — R5383 Other fatigue: Secondary | ICD-10-CM | POA: Diagnosis not present

## 2014-12-31 DIAGNOSIS — Z3490 Encounter for supervision of normal pregnancy, unspecified, unspecified trimester: Secondary | ICD-10-CM

## 2014-12-31 DIAGNOSIS — E079 Disorder of thyroid, unspecified: Secondary | ICD-10-CM | POA: Diagnosis not present

## 2014-12-31 LAB — I-STAT CHEM 8, ED
BUN: 12 mg/dL (ref 6–20)
CHLORIDE: 106 mmol/L (ref 101–111)
Calcium, Ion: 1.19 mmol/L (ref 1.12–1.23)
Creatinine, Ser: 0.8 mg/dL (ref 0.44–1.00)
Glucose, Bld: 85 mg/dL (ref 65–99)
HEMATOCRIT: 41 % (ref 36.0–46.0)
HEMOGLOBIN: 13.9 g/dL (ref 12.0–15.0)
Potassium: 3.7 mmol/L (ref 3.5–5.1)
SODIUM: 142 mmol/L (ref 135–145)
TCO2: 20 mmol/L (ref 0–100)

## 2014-12-31 LAB — POC URINE PREG, ED: Preg Test, Ur: POSITIVE — AB

## 2014-12-31 LAB — CBC
HCT: 39.6 % (ref 36.0–46.0)
HEMOGLOBIN: 13.5 g/dL (ref 12.0–15.0)
MCH: 29.5 pg (ref 26.0–34.0)
MCHC: 34.1 g/dL (ref 30.0–36.0)
MCV: 86.7 fL (ref 78.0–100.0)
PLATELETS: 245 10*3/uL (ref 150–400)
RBC: 4.57 MIL/uL (ref 3.87–5.11)
RDW: 12.3 % (ref 11.5–15.5)
WBC: 7.2 10*3/uL (ref 4.0–10.5)

## 2014-12-31 MED ORDER — LEVOTHYROXINE SODIUM 100 MCG PO TABS: 100.0000 ug | ORAL_TABLET | Freq: Every day | ORAL | Status: DC

## 2014-12-31 MED ORDER — ACETAMINOPHEN 325 MG PO TABS
650.0000 mg | ORAL_TABLET | Freq: Once | ORAL | Status: AC
  Administered 2014-12-31: 650 mg via ORAL
  Filled 2014-12-31: qty 2

## 2014-12-31 MED ORDER — PRENATAL COMPLETE 14-0.4 MG PO TABS: 1.0000 | ORAL_TABLET | Freq: Every day | ORAL | Status: DC

## 2014-12-31 NOTE — Discharge Instructions (Signed)
Return to the emergency room with worsening of symptoms, new symptoms or with symptoms that are concerning, especially fevers, palpitations, tremors, shaking, diarrhea, weakness, worsening fatigue. Call to make an appointment as soon as possible with your endocrinologist. Call to make follow-up appointment with OB/GYN to establish care Please call your doctor for a followup appointment within 24-48 hours. When you talk to your doctor please let them know that you were seen in the emergency department and have them acquire all of your records so that they can discuss the findings with you and formulate a treatment plan to fully care for your new and ongoing problems. If you do not have a primary care provider please call the number below under ED resources to establish care with a provider and follow up.    Hypothyroidism The thyroid is a large gland located in the lower front of your neck. The thyroid gland helps control metabolism. Metabolism is how your body handles food. It controls metabolism with the hormone thyroxine. When this gland is underactive (hypothyroid), it produces too little hormone.  CAUSES These include:   Absence or destruction of thyroid tissue.  Goiter due to iodine deficiency.  Goiter due to medications.  Congenital defects (since birth).  Problems with the pituitary. This causes a lack of TSH (thyroid stimulating hormone). This hormone tells the thyroid to turn out more hormone. SYMPTOMS  Lethargy (feeling as though you have no energy)  Cold intolerance  Weight gain (in spite of normal food intake)  Dry skin  Coarse hair  Menstrual irregularity (if severe, may lead to infertility)  Slowing of thought processes Cardiac problems are also caused by insufficient amounts of thyroid hormone. Hypothyroidism in the newborn is cretinism, and is an extreme form. It is important that this form be treated adequately and immediately or it will lead rapidly to retarded  physical and mental development. DIAGNOSIS  To prove hypothyroidism, your caregiver may do blood tests and ultrasound tests. Sometimes the signs are hidden. It may be necessary for your caregiver to watch this illness with blood tests either before or after diagnosis and treatment. TREATMENT  Low levels of thyroid hormone are increased by using synthetic thyroid hormone. This is a safe, effective treatment. It usually takes about four weeks to gain the full effects of the medication. After you have the full effect of the medication, it will generally take another four weeks for problems to leave. Your caregiver may start you on low doses. If you have had heart problems the dose may be gradually increased. It is generally not an emergency to get rapidly to normal. HOME CARE INSTRUCTIONS   Take your medications as your caregiver suggests. Let your caregiver know of any medications you are taking or start taking. Your caregiver will help you with dosage schedules.  As your condition improves, your dosage needs may increase. It will be necessary to have continuing blood tests as suggested by your caregiver.  Report all suspected medication side effects to your caregiver. SEEK MEDICAL CARE IF: Seek medical care if you develop:  Sweating.  Tremulousness (tremors).  Anxiety.  Rapid weight loss.  Heat intolerance.  Emotional swings.  Diarrhea.  Weakness. SEEK IMMEDIATE MEDICAL CARE IF:  You develop chest pain, an irregular heart beat (palpitations), or a rapid heart beat. MAKE SURE YOU:   Understand these instructions.  Will watch your condition.  Will get help right away if you are not doing well or get worse. Document Released: 07/21/2005 Document Revised: 10/13/2011 Document  Reviewed: 03/10/2008 ExitCare Patient Information 2015 [place], [place]. This information is not intended to replace advice given to you by your health care provider. Make sure you discuss any questions you have  with your health care provider.  Emergency Department Resource Guide 1) Find a Doctor and Pay Out of Pocket Although you won't have to find out who is covered by your insurance plan, it is a good idea to ask around and get recommendations. You will then need to call the office and see if the doctor you have chosen will accept you as a new patient and what types of options they offer for patients who are self-pay. Some doctors offer discounts or will set up payment plans for their patients who do not have insurance, but you will need to ask so you aren't surprised when you get to your appointment.  2) Contact Your Local Health Department Not all health departments have doctors that can see patients for sick visits, but many do, so it is worth a call to see if yours does. If you don't know where your local health department is, you can check in your phone book. The CDC also has a tool to help you locate your state's health department, and many state websites also have listings of all of their local health departments.  3) Find a [facility] If your illness is not likely to be very severe or complicated, you may want to try a walk in clinic. These are popping up all over the country in pharmacies, drugstores, and shopping centers. They're usually staffed by nurse practitioners or physician assistants that have been trained to treat common illnesses and complaints. They're usually fairly quick and inexpensive. However, if you have serious medical issues or chronic medical problems, these are probably not your best option.  No Primary Care Doctor: - Call Health Connect at  [phone] - they can help you locate a primary care doctor that  accepts your insurance, provides certain services, etc. - Physician Referral Service- [phone]  Chronic Pain Problems: Organization         Address  Phone   Notes  [facility]  [phone] Patients need to be referred by their primary care  doctor.   Medication Assistance: Organization         Address  Phone   Notes  [facility] Medication [facility] [address] [place], [place] [address] [phone] --Must be a resident of [facility] -- Must have NO insurance coverage whatsoever (no Medicaid/ Medicare, etc.) -- The pt. MUST have a primary care doctor that directs their care regularly and follows them in the community   MedAssist  [phone]   [employer]  [phone]    Agencies that provide inexpensive medical care: Organization         Address  Phone   Notes  [place]  [phone]   [facility]    [phone]   [facility] [place], [place] [address] [phone]   [place] [address], [place] [phone]   Planned Parenthood    [phone]   [facility]    [phone]   [place] and [place] [address], [place] Phone:  [phone], Fax:  [phone] Hours of Operation:  9 am - 6 pm, M-F.  Also accepts Medicaid/Medicare and self-pay.  [facility]  [place] for [place], [address], [place] Phone: [phone], Fax: [phone]. Hours of Operation:  8:30 am - 5:30 pm, M-F.  Also accepts Medicaid and self-pay.  [facility] [place] [address], [place] Phone: [phone]   [place], [place], [place] [phone], Ext. [phone] Mondays & Thursdays: 7-9 AM.  First 15 patients are seen on a first come, first serve basis.    [place] Providers:  Organization         Address  Phone   Notes  [facility] [address], [place] [phone] Also accepts self-pay patients.  [facility] [address] [place], [place]  [phone]   [place], [address], [place] [phone]   [facility] [address] [place] [address], [place] [phone]   Lucianne Lei [address], [place]   [phone] Only accepts [place] Access [place] patients after they have their name applied to their card.   Self-Pay (no insurance) in [facility]:  Organization         Address  Phone   Notes  Sickle Cell Patients, [facility] [place] [phone]   [facility] Care [place] [phone]   [facility] [place]  [place], [place], [place] [phone]   [facility]/Dr. Osei-Bonsu  [address] [place] or [address], [place] [phone] Phone number for both [place] and [place] locations is the same.  [facility] [address], [place] [phone]   [facility] [address], [place] or [address] [phone]   [facility] [address], [place] [phone], phone; [phone], fax Sees patients 1st and 3rd Saturday of every month.  Must not qualify for public or private insurance (i.e. Medicaid, Medicare, [place] Health Choice, Veterans' Benefits)  Household income should be no more than 200% of the poverty level The clinic cannot treat you if you are pregnant or think you are pregnant  Sexually transmitted diseases are not treated at the clinic.    Dental Care: Organization         Address  Phone  Notes  [facility] Department of [facility] [phone] Accepts children up to age 100 who are enrolled in [place] or [place]; pregnant women with a Medicaid card; and children who have applied for Medicaid or [place] Health Choice, but were declined, whose parents can pay a reduced fee at time of  service.  [facility] Department of [facility]  [address], [place] [phone] Accepts children up to age 52 who are enrolled in [place] or [place]; pregnant women with a Medicaid card; and children who have applied for Medicaid or [place] Health Choice, but were declined, whose parents can pay a reduced fee at time of service.  [facility] PROGRAM  [place] [phone] Patients are seen by appointment only. Walk-ins are not accepted. [place] will see patients 32 years of age and older. Monday - Tuesday (8am-5pm) Most Wednesdays (8:30-5pm) $30 per visit,  cash only  [employer] Adult [employer] PROGRAM  [address], [place] [phone] Patients are seen by appointment only. Walk-ins are not accepted. [place] will see patients 24 years of age and older. One Wednesday Evening (Monthly: Volunteer Based).  $30 per visit, cash only  [place]  [phone] for adults; Children under age 42, call Graduate Pediatric Dentistry at [phone]. Children aged 36-14, please call [phone] to request a pediatric application.  Dental services are provided in all areas of dental care including fillings, crowns and bridges, complete and partial dentures, implants, gum treatment, root canals, and extractions. Preventive care is also provided. Treatment is provided to both adults and children. Patients are selected via a lottery and there is often a waiting list.   [facility] [address], [place]  [phone] [email or web address]   [facility] [address] [place], [place] [phone], Ext. [phone] Second and Fourth Thursday of each month, opens at 6:30 AM; Clinic ends at 9 AM.  Patients are seen on a first-come first-served basis, and a limited number are seen during each clinic.   [facility]  [address] [place] Danker [place],  [place] [phone]   Eligibility Requirements You must have lived in [place], [place], or [place] counties for at least the last three months.   You cannot be eligible for state or federal sponsored [employer], including [employer], [place], or [employer].   You generally cannot be eligible for healthcare insurance through your employer.    How to apply: Eligibility screenings are held every Tuesday and Wednesday afternoon from 1:00 pm until 4:00 pm. You do not need an appointment for the interview!  [facility] [address], [place], [place]   [place]  [place] Department  [place]  [phone]    Behavioral Health Resources in the Community: Intensive Outpatient Programs Organization         Address  Phone  Notes  [place]. [address] [place], [place] [phone]   [facility] Outpatient [address] [place], [place]   ADS: Alcohol & Drug Svcs [address], [place], [place]   [place] [address],  [place], [place] or [phone]   Substance Abuse Resources Organization         Address  Phone  Notes  Alcohol and Drug Services  [phone]   [place]  [phone]   [facility] [place]   Chinita Pester  [phone]   Residential & Outpatient Substance Abuse Program  [phone]   Psychological Services Organization         Address  Phone  Notes  [facility]  [place]  [phone]   [place] [address], [place] or [phone]    Mobile Crisis Teams Organization         Address  Phone  Notes  [facility], Mobile Crisis Care Unit  [phone]   [facility]  [address] [place], [place]   [place] [address] [place] [phone]    Self-Help/Support Groups Organization         Address  Phone  Notes  Mental Health Assoc. of [place] - variety of support groups  [place] Call for more information  Narcotics Anonymous (NA), [facility] [address], [employer] [place]  2 meetings at this location   Special educational needs teacher         Address  Phone  Notes  [facility] Residential Treatment [place],    [place]  [phone]   [facility]  [address], [place] [address], [place], [place]   [place], [place] [phone] Admissions: 8am-3pm M-F  Incentives Substance [place] [address],    [place], [place] [phone]   [facility] [address] [place], [place], [place]   [facility] [address],  [place], [place]   [facility] Programs - Intensive Outpatient [address], [place], [place]   [facility] ([place].,  [place], [place] [phone] or [phone]   [facility] (RTS) [address], [place], [place] Accepts Medicaid  [facility] [place] [address],  [place] [phone] Substance Abuse/Addiction Treatment   [facility] Organization         Address  Phone  Notes  [facility]  [phone]   Domenic Schwab, PhD [address] Arlis Port[place], [place]   [phone] or [phone]   [place], [place] [phone]   [facility] [address], [place], [place] [phone] Insurance/Medicaid/sponsorship through [facility] and Families [address], [address] [place]                                    [place], [place] [phone]  [place] [address], [place] [phone]    Dr. Adele Schilder  [phone]   [facility] [place] Dept. 1) [address], [place] 2) [place] 3)  [place], [place] [phone]  [phone]   [place] [phone] or [phone] (After Hours)

## 2014-12-31 NOTE — ED Notes (Signed)
Pt states that she has been tired and chilled for 2 days. Pt states that shew has not had her thyroid medications in 1-2 weeks. Pt has hx of thyroid cancer. Pt also reports recently finding out that she was pregnant unsure of how far along. LMP 11-28-14. Pt reports headache as well.

## 2014-12-31 NOTE — ED Provider Notes (Signed)
CSN: [id number]     Arrival date & time 12/31/14  0857 History   First MD Initiated Contact with Patient 12/31/14 0901     Chief Complaint  Patient presents with  . Fatigue  . Chills     (Consider location/radiation/quality/duration/timing/severity/associated sxs/prior Treatment) HPI  [name] is a 29 y.o. female with PMH of thyroid cancer with thyroidectomy presenting with fatigue and chills for the past 2 days. Patient states this happens when she has not taken her thyroid medications that she's been off for 1-2 weeks She is not been able to see her doctor. Patient denies alleviating or aggravating factors. Patient also with headache that developed gradually and is like other migraine she's had before. No weakness, slurred speech. No fevers or chills. No nausea, vomiting, abdominal pain, constipation. No chest pain or shortness of breath. No palpitations. Patient also states she may be pregnant. No prenatal care at this time. Last menstrual period 11/28/2014. No abdominal pain, vaginal bleeding, discharge or pain. No urinary symptoms.   Past Medical History  Diagnosis Date  . Cancer 02/2012    thyroid cancer  . Thyroid disease     cancer   Past Surgical History  Procedure Laterality Date  . Thyroidectomy  02/2012    total L side first and R side 7 days later.  . Cesarean section  2006   Family History  Problem Relation Age of Onset  . Hypertension Mother   . Blindness Mother     one eye   History  Substance Use Topics  . Smoking status: Never Smoker   . Smokeless tobacco: Never Used  . Alcohol Use: No   OB History    Gravida Para Term Preterm AB TAB SAB Ectopic Multiple Living   1 1 1       1      Review of Systems 10 Systems reviewed and are negative for acute change except as noted in the HPI.    Allergies  Review of patient's allergies indicates no known allergies.  Home Medications   Prior to Admission medications   Medication Sig Start Date End  Date Taking? Authorizing Provider  aspirin EC 81 MG tablet Take 162 mg by mouth once.   Yes Historical Provider, MD  levothyroxine (SYNTHROID, LEVOTHROID) 200 MCG tablet Take 200 mcg by mouth daily before breakfast.    Yes Historical Provider, MD  fluconazole (DIFLUCAN) 150 MG tablet Take 1 tablet (150 mg total) by mouth daily. Patient not taking: Reported on 12/31/2014 05/06/14   Olegario Messier, NP  HYDROcodone-acetaminophen (NORCO/VICODIN) 5-325 MG per tablet 1 to 2 tabs every 4 to 6 hours as needed for pain. Patient not taking: Reported on 12/31/2014 05/15/14   Harden Mo, MD  levothyroxine (SYNTHROID, LEVOTHROID) 100 MCG tablet Take 1 tablet (100 mcg total) by mouth daily before breakfast. Take one tablet (118mcg total) by mouth for first 7 days before breakfast. After 7 days start taking 2 tables (222mcg total) by mouth before breakfast 12/31/14   Al Corpus, PA-C  meloxicam (MOBIC) 15 MG tablet Take 1 tablet (15 mg total) by mouth daily. Patient not taking: Reported on 12/31/2014 05/15/14   Harden Mo, MD  methocarbamol (ROBAXIN) 500 MG tablet Take 1 tablet (500 mg total) by mouth 3 (three) times daily. Patient not taking: Reported on 12/31/2014 05/15/14   Harden Mo, MD  naproxen (NAPROSYN) 375 MG tablet Take 1 tablet (375 mg total) by mouth 2 (two) times daily with a meal.  As needed for pain Patient not taking: Reported on 12/31/2014 04/26/14   Lutricia Feil, PA  Prenatal Vit-Fe Fumarate-FA (PRENATAL COMPLETE) 14-0.4 MG TABS Take 1 tablet by mouth daily. 12/31/14   Al Corpus, PA-C   BP 117/69 mmHg  Pulse 95  Temp(Src) 98 F (36.7 C) (Oral)  Resp 17  SpO2 100% Physical Exam  Constitutional: She is oriented to person, place, and time. She appears well-developed and well-nourished. No distress.  HENT:  Head: Normocephalic and atraumatic.  Eyes: Conjunctivae and EOM are normal. Pupils are equal, round, and reactive to light. Right eye exhibits no discharge. Left  eye exhibits no discharge.  Cardiovascular: Normal rate and regular rhythm.   Pulmonary/Chest: Effort normal and breath sounds normal. No respiratory distress. She has no wheezes.  Abdominal: Soft. Bowel sounds are normal. She exhibits no distension. There is no tenderness.  Neurological: She is alert and oriented to person, place, and time. No cranial nerve deficit. She exhibits normal muscle tone. Coordination normal.  Speech is clear and goal oriented. Peripheral visual fields intact. Strength 5/5 in upper and lower extremities. Sensation intact. Intact rapid alternating movements, finger to nose, and heel to shin.  No pronator drift. Normal gait.  Skin: Skin is warm and dry. She is not diaphoretic.  Nursing note and vitals reviewed.   ED Course  Procedures (including critical care time) Labs Review Labs Reviewed  POC URINE PREG, ED - Abnormal; Notable for the following:    Preg Test, Ur POSITIVE (*)    All other components within normal limits  CBC  I-STAT CHEM 8, ED    Imaging Review No results found.   EKG Interpretation None      ED ECG REPORT   Date: 12/31/2014  Rate: 85  Rhythm: normal sinus rhythm  QRS Axis: normal  Intervals: normal  ST/T Wave abnormalities: borderline T abnormalities/flattening, inferior leads   Conduction Disutrbances:none   Old EKG Reviewed: changes noted. No longer tachycardic    MDM   Final diagnoses:  Other fatigue  Pregnancy   Pt with past medical history of thyroidectomy status post thyroid cancer treated with Synthroid presenting with fatigue, chills and pregnancy. Patient has been off her medications for 1-2 weeks. Patient afebrile without tachycardia. Neurological exam without deficits. Patient's labwork reassuring. No anemia. No joint abnormalities. EKG sinus rhythm without evidence of arrhythmia or ischemia. Patient with urine pregnancy positive. Will refill Synthroid. Stressed the importance of following up with her  endocrinologist in the setting of pregnancy. Patient also given referral to the wellness center to establish care with a primary care provider. She then given referral to OB/GYN and prenatal vitamins. Patient is afebrile, nontoxic, and in no acute distress. Patient is appropriate for outpatient management and is stable for discharge.  Discussed return precautions with patient. Discussed all results and patient verbalizes understanding and agrees with plan.  Case has been discussed with Dr. Jeneen Rinks who agrees with the above plan and to discharge.     Al Corpus, PA-C 12/31/14 [place], MD 01/06/15 [phone]

## 2015-01-02 ENCOUNTER — Encounter (HOSPITAL_COMMUNITY): Payer: Self-pay | Admitting: *Deleted

## 2015-01-02 ENCOUNTER — Inpatient Hospital Stay (HOSPITAL_COMMUNITY)
Admission: AD | Admit: 2015-01-02 | Discharge: 2015-01-02 | Disposition: A | Discharge status: Home / Self Care | Payer: Medicaid Other | Source: Ambulatory Visit | Attending: Family Medicine | Admitting: Family Medicine

## 2015-01-02 DIAGNOSIS — Z85850 Personal history of malignant neoplasm of thyroid: Secondary | ICD-10-CM | POA: Diagnosis not present

## 2015-01-02 DIAGNOSIS — Z8249 Family history of ischemic heart disease and other diseases of the circulatory system: Secondary | ICD-10-CM | POA: Insufficient documentation

## 2015-01-02 DIAGNOSIS — Z3200 Encounter for pregnancy test, result unknown: Secondary | ICD-10-CM | POA: Diagnosis present

## 2015-01-02 DIAGNOSIS — Z3201 Encounter for pregnancy test, result positive: Secondary | ICD-10-CM | POA: Insufficient documentation

## 2015-01-02 LAB — URINE MICROSCOPIC-ADD ON

## 2015-01-02 LAB — URINALYSIS, ROUTINE W REFLEX MICROSCOPIC
BILIRUBIN URINE: NEGATIVE
GLUCOSE, UA: NEGATIVE mg/dL
Ketones, ur: 15 mg/dL — AB
Leukocytes, UA: NEGATIVE
Nitrite: NEGATIVE
Protein, ur: NEGATIVE mg/dL
Specific Gravity, Urine: >1.03 — ABNORMAL HIGH (ref 1.005–1.030)
UROBILINOGEN UA: 0.2 mg/dL (ref 0.0–1.0)
pH: 5.5 (ref 5.0–8.0)

## 2015-01-02 NOTE — Progress Notes (Addendum)
Patient states she is only here for a pregnancy verification letter. States she does not wish to be assessed and treated for N/V. States she thinks she has been sick from something she ate. Reina Fuse, CNM made aware of patient request.

## 2015-01-02 NOTE — MAU Provider Note (Signed)
S:  [name] is a 29 y.o. G2P1001 at 100w1d wks here for confirmation of pregnancy.  Patient's Patient's last menstrual period was 11/27/2014 (approximate)..  Denies any vaginal bleeding or abdominal pain.  Pt uncertain regarding where she plans to go for care.  Pt has a history of thyroid cancer with thyroidectomy.  Uncertain regarding TSH level.  Reports that it was drawn two days ago and waiting on results.  O:  Past Medical History  Diagnosis Date  . Cancer 02/2012    thyroid cancer  . Thyroid disease     cancer    Family History  Problem Relation Age of Onset  . Hypertension Mother   . Blindness Mother     one eye     Filed Vitals:   01/02/15 1049  BP: 136/87  Pulse: 97  Temp: 98.1 F (36.7 C)  Resp: 18   General:  A&OX3 with no signs of acute distress. She appears well-developed and well-nourished. No distress.  Neck: Normal range of motion.  Pulmonary/Chest: Effort normal. No respiratory distress.  Musculoskeletal: Normal range of motion.  Neurological: She is alert and oriented to person, place, and time.  Skin: Skin is warm and dry.   A: Positive Pregnancy Test Hx of thyroid cancer  P: Explained benefits of taking prenatal vitamins w/folic acid early in pregnancy. Begin prenatal care. Reviewed warning signs of pregnancy. Scheduled appt in [facility] clinic for new ob Outpatient viability ultrasound ordered prior to visit - provider will call pt with results.    Venia Carbon Michiel Cowboy, CNM

## 2015-01-02 NOTE — MAU Note (Signed)
Unable to hold food down since yesterday. No other complaints, needs verification letter.

## 2015-01-15 ENCOUNTER — Encounter (HOSPITAL_COMMUNITY): Payer: Self-pay

## 2015-01-15 ENCOUNTER — Ambulatory Visit: Payer: Medicaid Other

## 2015-01-15 ENCOUNTER — Inpatient Hospital Stay (HOSPITAL_COMMUNITY): Payer: Medicaid Other

## 2015-01-15 ENCOUNTER — Inpatient Hospital Stay (HOSPITAL_COMMUNITY)
Admission: AD | Admit: 2015-01-15 | Discharge: 2015-01-15 | Disposition: A | Discharge status: Home / Self Care | Payer: Medicaid Other | Source: Ambulatory Visit | Attending: Obstetrics and Gynecology | Admitting: Obstetrics and Gynecology

## 2015-01-15 DIAGNOSIS — R102 Pelvic and perineal pain: Secondary | ICD-10-CM | POA: Diagnosis not present

## 2015-01-15 DIAGNOSIS — O9989 Other specified diseases and conditions complicating pregnancy, childbirth and the puerperium: Secondary | ICD-10-CM | POA: Diagnosis not present

## 2015-01-15 DIAGNOSIS — R109 Unspecified abdominal pain: Secondary | ICD-10-CM | POA: Diagnosis present

## 2015-01-15 DIAGNOSIS — Z3A01 Less than 8 weeks gestation of pregnancy: Secondary | ICD-10-CM | POA: Diagnosis not present

## 2015-01-15 DIAGNOSIS — O3680X1 Pregnancy with inconclusive fetal viability, fetus 1: Secondary | ICD-10-CM

## 2015-01-15 DIAGNOSIS — O26891 Other specified pregnancy related conditions, first trimester: Secondary | ICD-10-CM

## 2015-01-15 DIAGNOSIS — O359XX1 Maternal care for (suspected) fetal abnormality and damage, unspecified, fetus 1: Secondary | ICD-10-CM

## 2015-01-15 LAB — CBC
HCT: 37.4 % (ref 36.0–46.0)
HEMOGLOBIN: 13.1 g/dL (ref 12.0–15.0)
MCH: 30.3 pg (ref 26.0–34.0)
MCHC: 35 g/dL (ref 30.0–36.0)
MCV: 86.4 fL (ref 78.0–100.0)
Platelets: 231 10*3/uL (ref 150–400)
RBC: 4.33 MIL/uL (ref 3.87–5.11)
RDW: 12.4 % (ref 11.5–15.5)
WBC: 10.7 10*3/uL — ABNORMAL HIGH (ref 4.0–10.5)

## 2015-01-15 LAB — WET PREP, GENITAL
Clue Cells Wet Prep HPF POC: NONE SEEN
Trich, Wet Prep: NONE SEEN
YEAST WET PREP: NONE SEEN

## 2015-01-15 LAB — URINE MICROSCOPIC-ADD ON

## 2015-01-15 LAB — URINALYSIS, ROUTINE W REFLEX MICROSCOPIC
Bilirubin Urine: NEGATIVE
Glucose, UA: NEGATIVE mg/dL
KETONES UR: NEGATIVE mg/dL
Leukocytes, UA: NEGATIVE
Nitrite: NEGATIVE
Protein, ur: NEGATIVE mg/dL
UROBILINOGEN UA: 0.2 mg/dL (ref 0.0–1.0)
pH: 5.5 (ref 5.0–8.0)

## 2015-01-15 LAB — HCG, QUANTITATIVE, PREGNANCY: hCG, Beta Chain, Quant, S: 34670 m[IU]/mL — ABNORMAL HIGH (ref <5)

## 2015-01-15 MED ORDER — FAMOTIDINE 20 MG PO TABS: 20.0000 mg | ORAL_TABLET | Freq: Two times a day (BID) | ORAL | Status: DC

## 2015-01-15 MED ORDER — PROMETHAZINE HCL 25 MG PO TABS: 12.5000 mg | ORAL_TABLET | Freq: Four times a day (QID) | ORAL | Status: DC | PRN

## 2015-01-15 NOTE — MAU Note (Deleted)
Pt evaluated by her PCP Dr. Dema Severin and had ultrasound here today to confirm miscarriage. Was sent to MAU for rhogham b/c patient had previously been told she was rh negative.  Dr. Orest Dikes notes & ultrasound reviewed by Rockwell Alexandria CNM.  Abo/rh per today's visit is B positive.  Patient informed of results and will follow up with Dr. Dema Severin in the morning.

## 2015-01-15 NOTE — MAU Provider Note (Signed)
History     CSN: [id number]  Arrival date and time: 01/15/15 1952   None     Chief Complaint  Patient presents with  . Abdominal Cramping   HPI Comments: [name] is a 29 y.o. G2P1001 at [redacted]w[redacted]d who presents today with abdominal pain. She denies any vaginal bleeding. She has an appointment on 02/01/15 at [facility].   Abdominal Cramping This is a new problem. The current episode started today. The onset quality is gradual. The problem occurs intermittently. The problem has been waxing and waning. The pain is located in the suprapubic region. The pain is at a severity of 8/10. The quality of the pain is cramping. The abdominal pain does not radiate. Associated symptoms include nausea. Pertinent negatives include no constipation, diarrhea, dysuria, fever, frequency or vomiting. Nothing aggravates the pain. The pain is relieved by nothing. She has tried nothing for the symptoms.    Past Medical History  Diagnosis Date  . Cancer 02/2012    thyroid cancer  . Thyroid disease     cancer    Past Surgical History  Procedure Laterality Date  . Thyroidectomy  02/2012    total L side first and R side 7 days later.  . Cesarean section  2006    Family History  Problem Relation Age of Onset  . Hypertension Mother   . Blindness Mother     one eye    History  Substance Use Topics  . Smoking status: Never Smoker   . Smokeless tobacco: Never Used  . Alcohol Use: No    Allergies: No Known Allergies  Prescriptions prior to admission  Medication Sig Dispense Refill Last Dose  . levothyroxine (SYNTHROID, LEVOTHROID) 200 MCG tablet Take 200 mcg by mouth daily before breakfast. Take two 100 mcg tablets daily.   01/15/2015 at Unknown time    Review of Systems  Constitutional: Negative for fever.  Gastrointestinal: Positive for nausea. Negative for vomiting, abdominal pain, diarrhea and constipation.  Genitourinary: Negative for dysuria, urgency and frequency.   Physical Exam   Blood  pressure 142/108, pulse 95, temperature 98.3 F (36.8 C), temperature source Oral, resp. rate 18, height 5\' 2"  (1.575 m), weight 124.104 kg (273 lb 9.6 oz), last menstrual period 11/27/2014, SpO2 100 %.  Physical Exam  Nursing note and vitals reviewed. Constitutional: She is oriented to person, place, and time. She appears well-developed and well-nourished. No distress.  Cardiovascular: Normal rate.   Respiratory: Effort normal.  GI: Soft. There is no tenderness. There is no rebound.  Neurological: She is alert and oriented to person, place, and time.  Skin: Skin is warm and dry.  Psychiatric: She has a normal mood and affect.   Results for orders placed or performed during the hospital encounter of 01/15/15 (from the past 24 hour(s))  Urinalysis, Routine w reflex microscopic (not at [facility])     Status: Abnormal   Collection Time: 01/15/15  8:08 PM  Result Value Ref Range   Color, Urine YELLOW YELLOW   APPearance CLEAR CLEAR   Specific Gravity, Urine >1.030 (H) 1.005 - 1.030   pH 5.5 5.0 - 8.0   Glucose, UA NEGATIVE NEGATIVE mg/dL   Hgb urine dipstick MODERATE (A) NEGATIVE   Bilirubin Urine NEGATIVE NEGATIVE   Ketones, ur NEGATIVE NEGATIVE mg/dL   Protein, ur NEGATIVE NEGATIVE mg/dL   Urobilinogen, UA 0.2 0.0 - 1.0 mg/dL   Nitrite NEGATIVE NEGATIVE   Leukocytes, UA NEGATIVE NEGATIVE  Urine microscopic-add on  Status: Abnormal   Collection Time: 01/15/15  8:08 PM  Result Value Ref Range   Squamous Epithelial / LPF FEW (A) RARE   WBC, UA 0-2 <3 WBC/hpf   RBC / HPF 0-2 <3 RBC/hpf   Bacteria, UA FEW (A) RARE   Crystals CA OXALATE CRYSTALS (A) NEGATIVE   Urine-Other MUCOUS PRESENT   CBC     Status: Abnormal   Collection Time: 01/15/15  8:29 PM  Result Value Ref Range   WBC 10.7 (H) 4.0 - 10.5 K/uL   RBC 4.33 3.87 - 5.11 MIL/uL   Hemoglobin 13.1 12.0 - 15.0 g/dL   HCT 37.4 36.0 - 46.0 %   MCV 86.4 78.0 - 100.0 fL   MCH 30.3 26.0 - 34.0 pg   MCHC 35.0 30.0 - 36.0 g/dL   RDW  12.4 11.5 - 15.5 %   Platelets 231 150 - 400 K/uL  ABO/Rh     Status: None (Preliminary result)   Collection Time: 01/15/15  8:29 PM  Result Value Ref Range   ABO/RH(D) O POS   Wet prep, genital     Status: Abnormal   Collection Time: 01/15/15  8:35 PM  Result Value Ref Range   Yeast Wet Prep HPF POC NONE SEEN NONE SEEN   Trich, Wet Prep NONE SEEN NONE SEEN   Clue Cells Wet Prep HPF POC NONE SEEN NONE SEEN   WBC, Wet Prep HPF POC FEW (A) NONE SEEN   US Ob Comp Less 14 Wks  01/15/2015   CLINICAL DATA:  First trimester of pregnancy, lower abdominal pain.  EXAM: OBSTETRIC <14 WK [place] AND TRANSVAGINAL OB US  TECHNIQUE: Both transabdominal and transvaginal ultrasound examinations were performed for complete evaluation of the gestation as well as the maternal uterus, adnexal regions, and pelvic cul-de-sac. Transvaginal technique was performed to assess early pregnancy.  COMPARISON:  None.  FINDINGS: Intrauterine gestational sac: Visualized/normal in shape.  Yolk sac:  Visualized.  Embryo:  Visualized.  Cardiac Activity: Not visualized.  CRL:  3.3  mm   6 w   0 d                  [place] EDC: September 09, 2015  Maternal uterus/adnexae: Small subchorionic hemorrhage. Ovaries appear normal.  IMPRESSION: Probable early viable gestational sac with yolk sac and fetal pole, but no cardiac activity yet visualized. Small subchorionic hemorrhage is noted as well. Recommend follow-up quantitative B-HCG levels and follow-up US in 14 days to confirm and assess viability. This recommendation follows SRU consensus guidelines: Diagnostic Criteria for Nonviable Pregnancy Early in the First Trimester. [employer] Med 2013; 053:9767-34.   Electronically Signed   By: Marijo Conception, M.D.   On: 01/15/2015 21:17   US Ob Transvaginal  01/15/2015   CLINICAL DATA:  First trimester of pregnancy, lower abdominal pain.  EXAM: OBSTETRIC <14 WK [place] AND TRANSVAGINAL OB US  TECHNIQUE: Both transabdominal and transvaginal ultrasound examinations  were performed for complete evaluation of the gestation as well as the maternal uterus, adnexal regions, and pelvic cul-de-sac. Transvaginal technique was performed to assess early pregnancy.  COMPARISON:  None.  FINDINGS: Intrauterine gestational sac: Visualized/normal in shape.  Yolk sac:  Visualized.  Embryo:  Visualized.  Cardiac Activity: Not visualized.  CRL:  3.3  mm   6 w   0 d                  [place] EDC: September 09, 2015  Maternal uterus/adnexae: Small  subchorionic hemorrhage. Ovaries appear normal.  IMPRESSION: Probable early viable gestational sac with yolk sac and fetal pole, but no cardiac activity yet visualized. Small subchorionic hemorrhage is noted as well. Recommend follow-up quantitative B-HCG levels and follow-up US in 14 days to confirm and assess viability. This recommendation follows SRU consensus guidelines: Diagnostic Criteria for Nonviable Pregnancy Early in the First Trimester. [employer] Med 2013; 888:7579-72.   Electronically Signed   By: Marijo Conception, M.D.   On: 01/15/2015 21:17    MAU Course  Procedures  MDM   Assessment and Plan   1. Pregnancy with uncertain fetal viability, fetus 1   2. Pelvic pain affecting pregnancy in first trimester, antepartum    DC home Comfort measures reviewed  1stTrimester precautions  RX: pepcid and phenergan  Return to MAU as needed FU with OB as planned FU with [place] as planned on 01/23/15  Follow-up Information    Follow up with [facility].   Specialty:  Obstetrics and Gynecology   Why:  As scheduled   Contact information:   [place] [phone]        Mathis Bud 01/15/2015, 9:30 PM

## 2015-01-15 NOTE — MAU Note (Signed)
Lower abdominal cramping since this morning. Denies vaginal bleeding. Scheduled to be seen in high risk clinic d/t thyroid cancer.  Trouble swallowing food x 3 days; states it won't move down her esophagus.

## 2015-01-15 NOTE — Discharge Instructions (Signed)
First Trimester of Pregnancy The first trimester of pregnancy is from week 1 until the end of week 12 (months 1 through 3). A week after a sperm fertilizes an egg, the egg will implant on the wall of the uterus. This embryo will begin to develop into a baby. Genes from you and your partner are forming the baby. The female genes determine whether the baby is a boy or a girl. At 6-8 weeks, the eyes and face are formed, and the heartbeat can be seen on ultrasound. At the end of 12 weeks, all the baby's organs are formed.  Now that you are pregnant, you will want to do everything you can to have a healthy baby. Two of the most important things are to get good prenatal care and to follow your health care provider's instructions. Prenatal care is all the medical care you receive before the baby's birth. This care will help prevent, find, and treat any problems during the pregnancy and childbirth. BODY CHANGES Your body goes through many changes during pregnancy. The changes vary from woman to woman.   You may gain or lose a couple of pounds at first.  You may feel sick to your stomach (nauseous) and throw up (vomit). If the vomiting is uncontrollable, call your health care provider.  You may tire easily.  You may develop headaches that can be relieved by medicines approved by your health care provider.  You may urinate more often. Painful urination may mean you have a bladder infection.  You may develop heartburn as a result of your pregnancy.  You may develop constipation because certain hormones are causing the muscles that push waste through your intestines to slow down.  You may develop hemorrhoids or swollen, bulging veins (varicose veins).  Your breasts may begin to grow larger and become tender. Your nipples may stick out more, and the tissue that surrounds them (areola) may become darker.  Your gums may bleed and may be sensitive to brushing and flossing.  Dark spots or blotches (chloasma,  mask of pregnancy) may develop on your face. This will likely fade after the baby is born.  Your menstrual periods will stop.  You may have a loss of appetite.  You may develop cravings for certain kinds of food.  You may have changes in your emotions from day to day, such as being excited to be pregnant or being concerned that something may go wrong with the pregnancy and baby.  You may have more vivid and strange dreams.  You may have changes in your hair. These can include thickening of your hair, rapid growth, and changes in texture. Some women also have hair loss during or after pregnancy, or hair that feels dry or thin. Your hair will most likely return to normal after your baby is born. WHAT TO EXPECT AT YOUR PRENATAL VISITS During a routine prenatal visit:  You will be weighed to make sure you and the baby are growing normally.  Your blood pressure will be taken.  Your abdomen will be measured to track your baby's growth.  The fetal heartbeat will be listened to starting around week 10 or 12 of your pregnancy.  Test results from any previous visits will be discussed. Your health care provider may ask you:  How you are feeling.  If you are feeling the baby move.  If you have had any abnormal symptoms, such as leaking fluid, bleeding, severe headaches, or abdominal cramping.  If you have any questions. Other tests   that may be performed during your first trimester include:  Blood tests to find your blood type and to check for the presence of any previous infections. They will also be used to check for low iron levels (anemia) and Rh antibodies. Later in the pregnancy, blood tests for diabetes will be done along with other tests if problems develop.  Urine tests to check for infections, diabetes, or protein in the urine.  An ultrasound to confirm the proper growth and development of the baby.  An amniocentesis to check for possible genetic problems.  Fetal screens for  spina bifida and Down syndrome.  You may need other tests to make sure you and the baby are doing well. HOME CARE INSTRUCTIONS  Medicines  Follow your health care provider's instructions regarding medicine use. Specific medicines may be either safe or unsafe to take during pregnancy.  Take your prenatal vitamins as directed.  If you develop constipation, try taking a stool softener if your health care provider approves. Diet  Eat regular, well-balanced meals. Choose a variety of foods, such as meat or vegetable-based protein, fish, milk and low-fat dairy products, vegetables, fruits, and whole grain breads and cereals. Your health care provider will help you determine the amount of weight gain that is right for you.  Avoid raw meat and uncooked cheese. These carry germs that can cause birth defects in the baby.  Eating four or five small meals rather than three large meals a day may help relieve nausea and vomiting. If you start to feel nauseous, eating a few soda crackers can be helpful. Drinking liquids between meals instead of during meals also seems to help nausea and vomiting.  If you develop constipation, eat more high-fiber foods, such as fresh vegetables or fruit and whole grains. Drink enough fluids to keep your urine clear or pale yellow. Activity and Exercise  Exercise only as directed by your health care provider. Exercising will help you:  Control your weight.  Stay in shape.  Be prepared for labor and delivery.  Experiencing pain or cramping in the lower abdomen or low back is a good sign that you should stop exercising. Check with your health care provider before continuing normal exercises.  Try to avoid standing for long periods of time. Move your legs often if you must stand in one place for a long time.  Avoid heavy lifting.  Wear low-heeled shoes, and practice good posture.  You may continue to have sex unless your health care provider directs you  otherwise. Relief of Pain or Discomfort  Wear a good support bra for breast tenderness.   Take warm sitz baths to soothe any pain or discomfort caused by hemorrhoids. Use hemorrhoid cream if your health care provider approves.   Rest with your legs elevated if you have leg cramps or low back pain.  If you develop varicose veins in your legs, wear support hose. Elevate your feet for 15 minutes, 3-4 times a day. Limit salt in your diet. Prenatal Care  Schedule your prenatal visits by the twelfth week of pregnancy. They are usually scheduled monthly at first, then more often in the last 2 months before delivery.  Write down your questions. Take them to your prenatal visits.  Keep all your prenatal visits as directed by your health care provider. Safety  Wear your seat belt at all times when driving.  Make a list of emergency phone numbers, including numbers for family, friends, the hospital, and police and fire departments. General Tips    Ask your health care provider for a referral to a local prenatal education class. Begin classes no later than at the beginning of month 6 of your pregnancy.  Ask for help if you have counseling or nutritional needs during pregnancy. Your health care provider can offer advice or refer you to specialists for help with various needs.  Do not use hot tubs, steam rooms, or saunas.  Do not douche or use tampons or scented sanitary pads.  Do not cross your legs for long periods of time.  Avoid cat litter boxes and soil used by cats. These carry germs that can cause birth defects in the baby and possibly loss of the fetus by miscarriage or stillbirth.  Avoid all smoking, herbs, alcohol, and medicines not prescribed by your health care provider. Chemicals in these affect the formation and growth of the baby.  Schedule a dentist appointment. At home, brush your teeth with a soft toothbrush and be gentle when you floss. SEEK MEDICAL CARE IF:   You have  dizziness.  You have mild pelvic cramps, pelvic pressure, or nagging pain in the abdominal area.  You have persistent nausea, vomiting, or diarrhea.  You have a bad smelling vaginal discharge.  You have pain with urination.  You notice increased swelling in your face, hands, legs, or ankles. SEEK IMMEDIATE MEDICAL CARE IF:   You have a fever.  You are leaking fluid from your vagina.  You have spotting or bleeding from your vagina.  You have severe abdominal cramping or pain.  You have rapid weight gain or loss.  You vomit blood or material that looks like coffee grounds.  You are exposed to German measles and have never had them.  You are exposed to fifth disease or chickenpox.  You develop a severe headache.  You have shortness of breath.  You have any kind of trauma, such as from a fall or a car accident. Document Released: 07/15/2001 Document Revised: 12/05/2013 Document Reviewed: 05/31/2013 ExitCare Patient Information 2015 [employer]. This information is not intended to replace advice given to you by your health care provider. Make sure you discuss any questions you have with your health care provider.  

## 2015-01-16 LAB — HIV ANTIBODY (ROUTINE TESTING W REFLEX): HIV Screen 4th Generation wRfx: NONREACTIVE

## 2015-01-16 LAB — GC/CHLAMYDIA PROBE AMP (~~LOC~~) NOT AT ARMC
Chlamydia: NEGATIVE
Neisseria Gonorrhea: NEGATIVE

## 2015-01-16 LAB — ABO/RH: ABO/RH(D): O POS

## 2015-01-23 ENCOUNTER — Inpatient Hospital Stay (HOSPITAL_COMMUNITY)
Admission: AD | Admit: 2015-01-23 | Discharge: 2015-01-23 | Disposition: A | Discharge status: Home / Self Care | Payer: Medicaid Other | Source: Ambulatory Visit | Attending: Family Medicine | Admitting: Family Medicine

## 2015-01-23 ENCOUNTER — Ambulatory Visit (HOSPITAL_COMMUNITY)
Admit: 2015-01-23 | Discharge: 2015-01-23 | Disposition: A | Discharge status: Home / Self Care | Payer: Medicaid Other | Attending: Family | Admitting: Family

## 2015-01-23 DIAGNOSIS — Z3A01 Less than 8 weeks gestation of pregnancy: Secondary | ICD-10-CM | POA: Insufficient documentation

## 2015-01-23 DIAGNOSIS — Z3201 Encounter for pregnancy test, result positive: Secondary | ICD-10-CM

## 2015-01-23 DIAGNOSIS — O200 Threatened abortion: Secondary | ICD-10-CM

## 2015-01-23 NOTE — Discharge Instructions (Signed)

## 2015-01-23 NOTE — MAU Provider Note (Signed)
History     CSN: [id number]  Arrival date and time: 01/23/15 1648   None     No chief complaint on file.  HPI Pt here for f/u ultrasound for viability- pt was seen 1 week ago with lower abdominal pain- US showed [redacted]w[redacted]d embryo with no Cardiac activity seen and small [place] Pt denies spotting, bleeding or cramping Past Medical History  Diagnosis Date  . Cancer 02/2012    thyroid cancer  . Thyroid disease     cancer    Past Surgical History  Procedure Laterality Date  . Thyroidectomy  02/2012    total L side first and R side 7 days later.  . Cesarean section  2006    Family History  Problem Relation Age of Onset  . Hypertension Mother   . Blindness Mother     one eye    History  Substance Use Topics  . Smoking status: Never Smoker   . Smokeless tobacco: Never Used  . Alcohol Use: No    Allergies: No Known Allergies  Prescriptions prior to admission  Medication Sig Dispense Refill Last Dose  . famotidine (PEPCID) 20 MG tablet Take 1 tablet (20 mg total) by mouth 2 (two) times daily. 60 tablet 0   . levothyroxine (SYNTHROID, LEVOTHROID) 200 MCG tablet Take 200 mcg by mouth daily before breakfast. Take two 100 mcg tablets daily.   01/15/2015 at Unknown time  . promethazine (PHENERGAN) 25 MG tablet Take 0.5-1 tablets (12.5-25 mg total) by mouth every 6 (six) hours as needed. 30 tablet 0     Review of Systems  Constitutional: Negative for fever and chills.  Gastrointestinal: Negative for nausea, vomiting and abdominal pain.  Genitourinary: Negative for dysuria and urgency.   Physical Exam   Last menstrual period 11/27/2014.  Physical Exam  Nursing note reviewed. Constitutional: She appears well-developed and well-nourished. No distress.  HENT:  Head: Normocephalic.  Eyes: Pupils are equal, round, and reactive to light.  Neck: Normal range of motion. Neck supple.  Cardiovascular: Normal rate.   Respiratory: Effort normal.  GI: Soft.  Musculoskeletal: Normal  range of motion.  Neurological: She is alert.  Skin: Skin is warm and dry.  Psychiatric: She has a normal mood and affect.    MAU Course  Procedures US Ob Transvaginal  01/23/2015   CLINICAL DATA:  Viability. No fetal heart tones on previous ultrasound.  EXAM: TRANSVAGINAL OB ULTRASOUND  TECHNIQUE: Transvaginal ultrasound was performed for complete evaluation of the gestation as well as the maternal uterus, adnexal regions, and pelvic cul-de-sac.  COMPARISON:  01/15/2015  FINDINGS: Intrauterine gestational sac: Visualized/normal in shape.  Yolk sac:  Visualized, hydropic, measuring 8 mm in diameter.  Embryo:  Visualized  Cardiac Activity: Not visualized  Heart Rate:  bpm  MSD:   mm    w     d  CRL: 3.3 mm 6 w 0 d [place] EDC: 09/18/2015. No interval growth since prior study.  Maternal uterus/adnexae: Question small subchorionic hemorrhage. No adnexal masses. No free fluid.  IMPRESSION: No interval growth in the fetal pole since prior study. Yolk sac measures up to 8 mm in diameter. No fetal heart tones. Findings are suspicious but not yet definitive for failed pregnancy. Recommend follow-up US in 10-14 days for definitive diagnosis. This recommendation follows SRU consensus guidelines: Diagnostic Criteria for Nonviable Pregnancy Early in the First Trimester. [employer] Med 2013; 831:5176-16.   Electronically Signed   By: Rolm Baptise M.D.   On: 01/23/2015  16:33  discussed findings with Dr. Nehemiah Settle-  Discussed findings with pt including lack of growth over the week and no cardiac activity and impression from radiologist Pt denies spotting or bleeding or cramping at this time Pt prefers to re-evaluate in 1 week with ultrasound  threatened miscarriage information given to patient  Assessment and Plan  Lower abdominal pain in pregnancy No fetal growth or CA F/u 1 week(June 28) for repeat US for viability Note: pt has clinic appointment on June 30  Zion Ta 01/23/2015, 5:53 PM

## 2015-01-25 ENCOUNTER — Telehealth: Payer: Self-pay | Admitting: General Practice

## 2015-01-25 DIAGNOSIS — O200 Threatened abortion: Secondary | ICD-10-CM

## 2015-01-25 NOTE — Telephone Encounter (Signed)
Patient called and left message stating she has an appt on 6/30 with [place]. States she had an ultrasound done yesterday and there wasn't a heart beat. States that we jumped to conclusions and gave her a medication to kill her baby. Patient states she thinks it is too early for a heart beat anyway and doesn't want to take this medication. Patient states she would like a call back and have someone explain everything to her. Per chart review, cannot see where patient was given medication. Appears it was decided per patient to have ultrasound on 6/28 but do not see appt scheduled. Called ultrasound and made follow up ultrasound appt on 6/28 @ 1130. Called patient, no answer- left message stating we are trying to return your phone call to discuss your concerns, please call us back at the clinics

## 2015-01-28 ENCOUNTER — Inpatient Hospital Stay (HOSPITAL_COMMUNITY)
Admission: AD | Admit: 2015-01-28 | Discharge: 2015-01-28 | Disposition: A | Discharge status: Home / Self Care | Payer: Medicaid Other | Source: Ambulatory Visit | Attending: Obstetrics & Gynecology | Admitting: Obstetrics & Gynecology

## 2015-01-28 ENCOUNTER — Inpatient Hospital Stay (HOSPITAL_COMMUNITY): Payer: Medicaid Other

## 2015-01-28 ENCOUNTER — Encounter (HOSPITAL_COMMUNITY): Payer: Self-pay | Admitting: *Deleted

## 2015-01-28 DIAGNOSIS — O209 Hemorrhage in early pregnancy, unspecified: Secondary | ICD-10-CM | POA: Diagnosis not present

## 2015-01-28 DIAGNOSIS — O4691 Antepartum hemorrhage, unspecified, first trimester: Secondary | ICD-10-CM

## 2015-01-28 DIAGNOSIS — Z3A08 8 weeks gestation of pregnancy: Secondary | ICD-10-CM | POA: Diagnosis not present

## 2015-01-28 LAB — URINALYSIS, ROUTINE W REFLEX MICROSCOPIC
Bilirubin Urine: NEGATIVE
Glucose, UA: NEGATIVE mg/dL
Ketones, ur: 15 mg/dL — AB
Leukocytes, UA: NEGATIVE
Nitrite: NEGATIVE
Protein, ur: NEGATIVE mg/dL
Specific Gravity, Urine: >1.03 — ABNORMAL HIGH (ref 1.005–1.030)
Urobilinogen, UA: 0.2 mg/dL (ref 0.0–1.0)
pH: 5.5 (ref 5.0–8.0)

## 2015-01-28 LAB — URINE MICROSCOPIC-ADD ON

## 2015-01-28 NOTE — Discharge Instructions (Signed)
Miscarriage A miscarriage is the sudden loss of an unborn baby (fetus) before the 20th week of pregnancy. Most miscarriages happen in the first 3 months of pregnancy. Sometimes, it happens before a woman even knows she is pregnant. A miscarriage is also called a "spontaneous miscarriage" or "early pregnancy loss." Having a miscarriage can be an emotional experience. Talk with your caregiver about any questions you may have about miscarrying, the grieving process, and your future pregnancy plans. CAUSES   Problems with the fetal chromosomes that make it impossible for the baby to develop normally. Problems with the baby's genes or chromosomes are most often the result of errors that occur, by chance, as the embryo divides and grows. The problems are not inherited from the parents.  Infection of the cervix or uterus.   Hormone problems.   Problems with the cervix, such as having an incompetent cervix. This is when the tissue in the cervix is not strong enough to hold the pregnancy.   Problems with the uterus, such as an abnormally shaped uterus, uterine fibroids, or congenital abnormalities.   Certain medical conditions.   Smoking, drinking alcohol, or taking illegal drugs.   Trauma.  Often, the cause of a miscarriage is unknown.  SYMPTOMS   Vaginal bleeding or spotting, with or without cramps or pain.  Pain or cramping in the abdomen or lower back.  Passing fluid, tissue, or blood clots from the vagina. DIAGNOSIS  Your caregiver will perform a physical exam. You may also have an ultrasound to confirm the miscarriage. Blood or urine tests may also be ordered. TREATMENT   Sometimes, treatment is not necessary if you naturally pass all the fetal tissue that was in the uterus. If some of the fetus or placenta remains in the body (incomplete miscarriage), tissue left behind may become infected and must be removed. Usually, a dilation and curettage (D and C) procedure is performed.  During a D and C procedure, the cervix is widened (dilated) and any remaining fetal or placental tissue is gently removed from the uterus.  Antibiotic medicines are prescribed if there is an infection. Other medicines may be given to reduce the size of the uterus (contract) if there is a lot of bleeding.  If you have Rh negative blood and your baby was Rh positive, you will need a Rh immunoglobulin shot. This shot will protect any future baby from having Rh blood problems in future pregnancies. HOME CARE INSTRUCTIONS   Your caregiver may order bed rest or may allow you to continue light activity. Resume activity as directed by your caregiver.  Have someone help with home and family responsibilities during this time.   Keep track of the number of sanitary pads you use each day and how soaked (saturated) they are. Write down this information.   Do not use tampons. Do not douche or have sexual intercourse until approved by your caregiver.   Only take over-the-counter or prescription medicines for pain or discomfort as directed by your caregiver.   Do not take aspirin. Aspirin can cause bleeding.   Keep all follow-up appointments with your caregiver.   If you or your partner have problems with grieving, talk to your caregiver or seek counseling to help cope with the pregnancy loss. Allow enough time to grieve before trying to get pregnant again.  SEEK IMMEDIATE MEDICAL CARE IF:   You have severe cramps or pain in your back or abdomen.  You have a fever.  You pass large blood clots (walnut-sized   or larger) ortissue from your vagina. Save any tissue for your caregiver to inspect.   Your bleeding increases.   You have a thick, bad-smelling vaginal discharge.  You become lightheaded, weak, or you faint.   You have chills.  MAKE SURE YOU:  Understand these instructions.  Will watch your condition.  Will get help right away if you are not doing well or get  worse. Document Released: 01/14/2001 Document Revised: 11/15/2012 Document Reviewed: 09/09/2011 [employer] Patient Information 2015 [employer]. This information is not intended to replace advice given to you by your health care provider. Make sure you discuss any questions you have with your health care provider.  

## 2015-01-28 NOTE — MAU Note (Signed)
Patient presents at 7-[redacted] weeks gestation with c/o of light vaginal bleeding today.

## 2015-01-28 NOTE — MAU Provider Note (Signed)
History     CSN: [id number]  Arrival date and time: 01/28/15 3267   First Provider Initiated Contact with Patient 01/28/15 1849      Chief Complaint  Patient presents with  . Vaginal Bleeding   HPI 29 y.o. G2P1001 at [redacted]w[redacted]d w/ light vaginal bleeding starting today. Pt has been seen in MAU twice this pregnancy. She was seen on 01/15/15 and had u/s for viability which showed 6 week IUP with no cardiac activity. Pt returned for follow up u/s for viability on 01/22/15 and at that time u/s showed 6 wk IUP with no cardiac activity, no growth from prior exam. This was discussed with patient per MAU note and pt preferred to wait another week for repeat viability scan. Pt reports light vaginal bleeding w/ no pain today. She had intercourse 2 days ago.   Past Medical History  Diagnosis Date  . Cancer 02/2012    thyroid cancer  . Thyroid disease     cancer    Past Surgical History  Procedure Laterality Date  . Thyroidectomy  02/2012    total L side first and R side 7 days later.  . Cesarean section  2006    Family History  Problem Relation Age of Onset  . Hypertension Mother   . Blindness Mother     one eye    History  Substance Use Topics  . Smoking status: Never Smoker   . Smokeless tobacco: Never Used  . Alcohol Use: No    Allergies: No Known Allergies  Prescriptions prior to admission  Medication Sig Dispense Refill Last Dose  . famotidine (PEPCID) 20 MG tablet Take 1 tablet (20 mg total) by mouth 2 (two) times daily. 60 tablet 0   . levothyroxine (SYNTHROID, LEVOTHROID) 200 MCG tablet Take 200 mcg by mouth daily before breakfast. Take two 100 mcg tablets daily.   01/15/2015 at Unknown time  . promethazine (PHENERGAN) 25 MG tablet Take 0.5-1 tablets (12.5-25 mg total) by mouth every 6 (six) hours as needed. 30 tablet 0     Review of Systems  Constitutional: Negative.   Respiratory: Negative.   Cardiovascular: Negative.   Gastrointestinal: Negative for nausea, vomiting,  abdominal pain, diarrhea and constipation.  Genitourinary: Negative for dysuria, urgency, frequency, hematuria and flank pain.       + bleeding   Musculoskeletal: Negative.   Neurological: Negative.   Psychiatric/Behavioral: Negative.    Physical Exam   Blood pressure 165/92, pulse 103, temperature 98.6 F (37 C), temperature source Oral, resp. rate 18, last menstrual period 11/27/2014.  Physical Exam  Nursing note and vitals reviewed. Constitutional: She is oriented to person, place, and time. She appears well-developed and well-nourished. No distress.  Obese   Cardiovascular: Normal rate.   Respiratory: Effort normal.  GI: Soft.  Genitourinary: No bleeding (one dime sized dark clot noted on exam, no other evidence of bleeding) in the vagina. No vaginal discharge found.  Cervix closed   Musculoskeletal: Normal range of motion.  Neurological: She is alert and oriented to person, place, and time.  Skin: Skin is warm and dry.  Psychiatric: She has a normal mood and affect.   US Ob Transvaginal  01/28/2015   CLINICAL DATA:  Vaginal bleeding in first-trimester pregnancy. Vaginal bleeding today.  EXAM: TRANSVAGINAL OB ULTRASOUND  TECHNIQUE: Transvaginal ultrasound was performed for complete evaluation of the gestation as well as the maternal uterus, adnexal regions, and pelvic cul-de-sac.  COMPARISON:  Obstetric ultrasound 01/23/2015 suspicious for failed pregnancy.  FINDINGS:  Intrauterine gestational sac: Visualized/normal in shape.  Yolk sac: Visualized, currently measures 6 mm in diameter, previously 8 mm.  Embryo:  Visualized  Cardiac Activity: Not visualized.  CRL: 3.1 mm mm 6 w 0 d [place] EDC: 09/23/2015. No interval growth since prior exam.  Maternal uterus/adnexae: No subchorionic hemorrhage. Both ovaries are normal in size. No adnexal mass. No pelvic free fluid.  IMPRESSION: No interval growth in the fetal pole since prior exam. The yolk sac remains enlarged. No fetal heart tones.  Findings are suspicious for failed pregnancy, however a follow-up exam in 5-9 days is again recommended for definitive diagnosis.   Electronically Signed   By: Jeb Levering M.D.   On: 01/28/2015 20:08   MAU Course  Procedures  Ultrasound ordered for viability, prognosis discussed with patient. Care assumed by L. Clemmons, CNM [place] 01/28/2015, 6:55 PM    Assessment and Plan   Probable Miscarriage  F/U on June 30 in clinic Bleeding Precautions Discharge to home

## 2015-01-30 ENCOUNTER — Ambulatory Visit (HOSPITAL_COMMUNITY): Payer: Medicaid Other

## 2015-01-30 NOTE — Telephone Encounter (Signed)
Pt went to MAU on 6/26.

## 2015-02-01 ENCOUNTER — Ambulatory Visit (INDEPENDENT_AMBULATORY_CARE_PROVIDER_SITE_OTHER): Payer: Medicaid Other | Admitting: Family

## 2015-02-01 DIAGNOSIS — O3680X1 Pregnancy with inconclusive fetal viability, fetus 1: Secondary | ICD-10-CM

## 2015-02-01 LAB — POCT URINALYSIS DIP (DEVICE)
Bilirubin Urine: NEGATIVE
GLUCOSE, UA: NEGATIVE mg/dL
KETONES UR: NEGATIVE mg/dL
LEUKOCYTES UA: NEGATIVE
NITRITE: NEGATIVE
PH: 5.5 (ref 5.0–8.0)
PROTEIN: NEGATIVE mg/dL
Specific Gravity, Urine: >=1.03 (ref 1.005–1.030)
Urobilinogen, UA: 0.2 mg/dL (ref 0.0–1.0)

## 2015-02-01 NOTE — Progress Notes (Signed)
Subjective:   Pt is a 29 y.o. G2P1001 here for follow-up regarding inconclusive first trimester ultrasound.  Upon review of the records patient was first seen on 01/02/15 for confirmation of pregnancy.  Pt was scheduled for outpatient ultrasound.  Ultrasound on 01/15/15 showed probable early gestational sac with embryo and fetal pole, no cardiac activity.  CRL 3.3 mm.  Follow-up ultrasound on 01/23/15 showed continued CRL of 3.3 with no cardiac activity with recommendation to repeat ultrasound in 10-14 days. Pt returned on 01/28/15. Ultrasound completed and CRL measured 3.1 mm with no cardiac activity with continued recommendation to repeat in 5-9 days.  Pt presents to clinic today with no vaginal bleeding or pain.    Objective: Physical Exam  There were no vitals filed for this visit. Constitutional: She is oriented to person, place, and time. She appears well-developed and well-nourished. No distress.  Neck: Normal range of motion.  Pulmonary/Chest: Effort normal. No respiratory distress.  Musculoskeletal: Normal range of motion.  Neurological: She is alert and oriented to person, place, and time.  Skin: Skin is warm and dry.    Assessment: 29 y.o. G2P1001 with 6 wk sized pregnancy - no fetal heart tones  Plan: Explained to patient that results are highly indicative of a pregnancy that will not continue. Discussed the possible use of cytotec.

## 2015-02-01 NOTE — Progress Notes (Signed)
OB follow up US scheduled for July 5th @ 1500 for definitive pregnancy diagnoses

## 2015-02-04 ENCOUNTER — Emergency Department (HOSPITAL_COMMUNITY)
Admission: EM | Admit: 2015-02-04 | Discharge: 2015-02-04 | Disposition: A | Discharge status: Home / Self Care | Payer: Medicaid Other | Attending: Emergency Medicine | Admitting: Emergency Medicine

## 2015-02-04 ENCOUNTER — Encounter (HOSPITAL_COMMUNITY): Payer: Self-pay | Admitting: *Deleted

## 2015-02-04 ENCOUNTER — Emergency Department (HOSPITAL_COMMUNITY): Payer: Medicaid Other

## 2015-02-04 DIAGNOSIS — Y9229 Other specified public building as the place of occurrence of the external cause: Secondary | ICD-10-CM | POA: Diagnosis not present

## 2015-02-04 DIAGNOSIS — W010XXA Fall on same level from slipping, tripping and stumbling without subsequent striking against object, initial encounter: Secondary | ICD-10-CM | POA: Insufficient documentation

## 2015-02-04 DIAGNOSIS — D849 Immunodeficiency, unspecified: Secondary | ICD-10-CM | POA: Insufficient documentation

## 2015-02-04 DIAGNOSIS — Y939 Activity, unspecified: Secondary | ICD-10-CM | POA: Diagnosis not present

## 2015-02-04 DIAGNOSIS — S63042A Subluxation of carpometacarpal joint of left thumb, initial encounter: Secondary | ICD-10-CM | POA: Diagnosis not present

## 2015-02-04 DIAGNOSIS — Y999 Unspecified external cause status: Secondary | ICD-10-CM | POA: Insufficient documentation

## 2015-02-04 DIAGNOSIS — Z79899 Other long term (current) drug therapy: Secondary | ICD-10-CM | POA: Insufficient documentation

## 2015-02-04 DIAGNOSIS — E890 Postprocedural hypothyroidism: Secondary | ICD-10-CM | POA: Insufficient documentation

## 2015-02-04 DIAGNOSIS — S6992XA Unspecified injury of left wrist, hand and finger(s), initial encounter: Secondary | ICD-10-CM | POA: Diagnosis present

## 2015-02-04 DIAGNOSIS — Z85850 Personal history of malignant neoplasm of thyroid: Secondary | ICD-10-CM | POA: Diagnosis not present

## 2015-02-04 MED ORDER — ACETAMINOPHEN 325 MG PO TABS
650.0000 mg | ORAL_TABLET | Freq: Once | ORAL | Status: AC
  Administered 2015-02-04: 650 mg via ORAL
  Filled 2015-02-04: qty 2
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
## 2015-02-04 NOTE — Discharge Instructions (Signed)
Thumb Dislocation                                      Follow up with hand surgeon. Take Tylenol or Motrin for pain. Thumb dislocation is the displacement of the large bone of your thumb (metacarpal) from the socket that connects it to your hand. Very strong, fibrous tissues (ligaments) connect your thumb metacarpal to a bone in your hand at your thumb joint. Dislocation is caused by a forceful impact to your thumb. This impact moves your thumb metacarpal off your thumb joint and often tears your ligaments. Thumb dislocation usually occurs on the back (dorsal) side of your thumb.  SYMPTOMS Symptoms of thumb dislocation include:  Noticeable deformity of your thumb.  Pain, with loss of movement.  Weakened grip between your thumb and your index finger.  Looseness in the joint, indicating a tear of the ligaments. DIAGNOSIS  Thumb dislocation is diagnosed with a physical exam. Often, X-ray exams are done to see if you have associated injuries, such as bone fractures. TREATMENT  Thumb dislocations are treated by putting your bones back into position (reduction) either by manually moving the bones back into place or through surgery. Your thumb is then kept in a fixed position (immobilized) with the use of a plaster cast or splint for 4 to 6 weeks. When your ligament has to be surgically repaired, it needs to be kept in a fixed position with a plaster cast or splint for 6 weeks. Hand exercises or physical therapy to maintain the range of motion and to regain strength is usually started as soon as the ligament is healed. Exercises and therapy generally last no more than 3 months. HOME CARE INSTRUCTIONS The following measures can help to reduce pain and speed up the healing process:  Rest your injured joint. Do not move it. Avoid activities similar to the one that caused your injury.  Apply ice to your injured joint for the first day or 2 after your reduction or as directed by your caregiver. Applying  ice helps to reduce inflammation and pain.  Put ice in a plastic bag.  Place a towel between your skin and the bag.  Leave the ice on for 15 to 20 minutes at a time, every 2 hours while you are awake.  Elevate your hand above your heart as directed by your caregiver.  Take over-the-counter or prescription medicine for pain as your caregiver instructs you. SEEK IMMEDIATE MEDICAL CARE IF:  Your cast becomes damaged.  Your pain becomes worse rather than better.  You lose feeling in your thumb or cannot move the tip of your thumb. MAKE SURE YOU:  Understand these instructions.  Will watch your condition.  Will get help right away if you are not doing well or get worse. Document Released: 09/29/2001 Document Revised: 10/13/2011 Document Reviewed: 12/19/2010 [facility] Patient Information 2015 [place], [place]. This information is not intended to replace advice given to you by your health care provider. Make sure you discuss any questions you have with your health care provider. Cast or Splint Care Casts and splints support injured limbs and keep bones from moving while they heal.  HOME CARE  Keep the cast or splint uncovered during the drying period.  A plaster cast can take 24 to 48 hours to dry.  A fiberglass cast will dry in less than 1 hour.  Do not rest the cast on  anything harder than a pillow for 24 hours.  Do not put weight on your injured limb. Do not put pressure on the cast. Wait for your doctor's approval.  Keep the cast or splint dry.  Cover the cast or splint with a plastic bag during baths or wet weather.  If you have a cast over your chest and belly (trunk), take sponge baths until the cast is taken off.  If your cast gets wet, dry it with a towel or blow dryer. Use the cool setting on the blow dryer.  Keep your cast or splint clean. Wash a dirty cast with a damp cloth.  Do not put any objects under your cast or splint.  Do not scratch the skin under the  cast with an object. If itching is a problem, use a blow dryer on a cool setting over the itchy area.  Do not trim or cut your cast.  Do not take out the padding from inside your cast.  Exercise your joints near the cast as told by your doctor.  Raise (elevate) your injured limb on 1 or 2 pillows for the first 1 to 3 days. GET HELP IF:  Your cast or splint cracks.  Your cast or splint is too tight or too loose.  You itch badly under the cast.  Your cast gets wet or has a soft spot.  You have a bad smell coming from the cast.  You get an object stuck under the cast.  Your skin around the cast becomes red or sore.  You have new or more pain after the cast is put on. GET HELP RIGHT AWAY IF:  You have fluid leaking through the cast.  You cannot move your fingers or toes.  Your fingers or toes turn blue or white or are cool, painful, or puffy (swollen).  You have tingling or lose feeling (numbness) around the injured area.  You have bad pain or pressure under the cast.  You have trouble breathing or have shortness of breath.  You have chest pain. Document Released: 11/20/2010 Document Revised: 03/23/2013 Document Reviewed: 01/27/2013 [facility] Patient Information 2015 [place], [place]. This information is not intended to replace advice given to you by your health care provider. Make sure you discuss any questions you have with your health care provider.

## 2015-02-04 NOTE — ED Notes (Signed)
Bed: WTR7 Expected date:  Expected time:  Means of arrival:  Comments: 

## 2015-02-04 NOTE — ED Notes (Signed)
Pt fell last night on left thumb, pain and swelling noted with limited movement

## 2015-02-06 ENCOUNTER — Encounter (HOSPITAL_COMMUNITY): Payer: Self-pay | Admitting: *Deleted

## 2015-02-06 ENCOUNTER — Ambulatory Visit (HOSPITAL_COMMUNITY)
Admission: RE | Admit: 2015-02-06 | Discharge: 2015-02-06 | Disposition: A | Discharge status: Home / Self Care | Payer: Medicaid Other | Source: Ambulatory Visit | Attending: Family | Admitting: Family

## 2015-02-06 ENCOUNTER — Inpatient Hospital Stay (HOSPITAL_COMMUNITY)
Admission: AD | Admit: 2015-02-06 | Discharge: 2015-02-06 | Disposition: A | Discharge status: Home / Self Care | Payer: Medicaid Other | Source: Ambulatory Visit | Attending: Family Medicine | Admitting: Family Medicine

## 2015-02-06 ENCOUNTER — Ambulatory Visit (HOSPITAL_COMMUNITY): Payer: Medicaid Other

## 2015-02-06 DIAGNOSIS — O209 Hemorrhage in early pregnancy, unspecified: Secondary | ICD-10-CM | POA: Diagnosis not present

## 2015-02-06 DIAGNOSIS — Z3A01 Less than 8 weeks gestation of pregnancy: Secondary | ICD-10-CM | POA: Insufficient documentation

## 2015-02-06 DIAGNOSIS — O034 Incomplete spontaneous abortion without complication: Secondary | ICD-10-CM | POA: Insufficient documentation

## 2015-02-06 DIAGNOSIS — O3680X1 Pregnancy with inconclusive fetal viability, fetus 1: Secondary | ICD-10-CM

## 2015-02-06 LAB — CBC
HEMATOCRIT: 36.4 % (ref 36.0–46.0)
Hemoglobin: 12.5 g/dL (ref 12.0–15.0)
MCH: 30.1 pg (ref 26.0–34.0)
MCHC: 34.3 g/dL (ref 30.0–36.0)
MCV: 87.7 fL (ref 78.0–100.0)
Platelets: 240 10*3/uL (ref 150–400)
RBC: 4.15 MIL/uL (ref 3.87–5.11)
RDW: 12.5 % (ref 11.5–15.5)
WBC: 12.1 10*3/uL — ABNORMAL HIGH (ref 4.0–10.5)

## 2015-02-06 LAB — TYPE AND SCREEN
ABO/RH(D): O POS
Antibody Screen: NEGATIVE

## 2015-02-06 NOTE — MAU Provider Note (Signed)
Subjecitive:  [name] is a 29 y.o. female G2P1001 at [redacted]w[redacted]d presenting for a follow up US. The patient is aware that she has a failed pregnancy and feels that they are making her have repeated US for no reason. The baby has had no growth since the middle of June.   This is a desired pregnancy, however the patient says she was very aware of the tests done and knew the outcome was not going to be good. She has had bleeding for 12-24 hours; described as light.   Objective:  GENERAL: Well-developed, well-nourished female in no acute distress.  LUNGS: Effort normal SKIN: Warm, dry and without erythema PSYCH: Normal mood and affect  Filed Vitals:   02/06/15 1639  BP: 145/93  Pulse: 88  Temp: 98.4 F (36.9 C)  Resp: 18    No results found for this or any previous visit (from the past 48 hour(s)).   US Ob Transvaginal  02/06/2015   CLINICAL DATA:  Follow-up viability. No fetal heart tones seen on prior ultrasound  EXAM: TRANSVAGINAL OB ULTRASOUND  TECHNIQUE: Transvaginal ultrasound was performed for complete evaluation of the gestation as well as the maternal uterus, adnexal regions, and pelvic cul-de-sac.  COMPARISON:  01/28/2015  FINDINGS: Intrauterine gestational sac: Visualized  Yolk sac:  Visualized  Embryo:  Visualized  Cardiac Activity: Not visualized  Heart Rate:  bpm  MSD:   mm    w     d  CRL:   2.8  mm   6 w 0 d                  [place] EDC: 10/02/2015  Maternal uterus/adnexae: No subchorionic hemorrhage or adnexal mass. No free fluid.  IMPRESSION: Crown-rump length has decreased in size. No interval growth. No fetal heart tones. Findings highly suspicious of failed pregnancy.   Electronically Signed   By: [name] M.D.   On: 02/06/2015 15:46   MDM:  No fetal growth since 6/13; [place] today is highly suspicious for failed pregnancy; patient aware and agrees with US impression. I offered the patient watchful waiting>cytotec> D&E. Patient would to proceed with D&E  Dr.  Nehemiah [name] notified of patients request for D&E. Surgery scheduled tomorrow at 10:00; patient to arrive at 0830 for pre-op  CBC Type and screen done in MAU   Assessment:  1. Vaginal bleeding in pregnancy, first trimester   2. Incomplete spontaneous abortion    Plan:  Discharge home in stable condition O positive blood type Return to [facility] hospital tomorrow morning at 0830 for preop NPO past midnight  Whitney Lye, NP 02/07/2015 2:02 PM

## 2015-02-06 NOTE — Discharge Instructions (Signed)
Incomplete Miscarriage A miscarriage is the sudden loss of an unborn baby (fetus) before the 20th week of pregnancy. In an incomplete miscarriage, parts of the fetus or placenta (afterbirth) remain in the body.  Having a miscarriage can be an emotional experience. Talk with your health care provider about any questions you may have about miscarrying, the grieving process, and your future pregnancy plans. CAUSES   Problems with the fetal chromosomes that make it impossible for the baby to develop normally. Problems with the baby's genes or chromosomes are most often the result of errors that occur by chance as the embryo divides and grows. The problems are not inherited from the parents.  Infection of the cervix or uterus.  Hormone problems.  Problems with the cervix, such as having an incompetent cervix. This is when the tissue in the cervix is not strong enough to hold the pregnancy.  Problems with the uterus, such as an abnormally shaped uterus, uterine fibroids, or congenital abnormalities.  Certain medical conditions.  Smoking, drinking alcohol, or taking illegal drugs.  Trauma. SYMPTOMS   Vaginal bleeding or spotting, with or without cramps or pain.  Pain or cramping in the abdomen or lower back.  Passing fluid, tissue, or blood clots from the vagina. DIAGNOSIS  Your health care provider will perform a physical exam. You may also have an ultrasound to confirm the miscarriage. Blood or urine tests may also be ordered. TREATMENT   Usually, a dilation and curettage (D&C) procedure is performed. During a D&C procedure, the cervix is widened (dilated) and any remaining fetal or placental tissue is gently removed from the uterus.  Antibiotic medicines are prescribed if there is an infection. Other medicines may be given to reduce the size of the uterus (contract) if there is a lot of bleeding.  If you have Rh negative blood and your baby was Rh positive, you will need a Rho (D)  immune globulin shot. This shot will protect any future baby from having Rh blood problems in future pregnancies.  You may be confined to bed rest. This means you should stay in bed and only get up to use the bathroom. HOME CARE INSTRUCTIONS   Rest as directed by your health care provider.  Restrict activity as directed by your health care provider. You may be allowed to continue light activity if curettage was not done but you require further treatment.  Keep track of the number of pads you use each day. Keep track of how soaked (saturated) they are. Record this information.  Do not  use tampons.  Do not douche or have sexual intercourse until approved by your health care provider.  Keep all follow-up appointments for reevaluation and continuing management.  Only take over-the-counter or prescription medicines for pain, fever, or discomfort as directed by your health care provider.  Take antibiotic medicine as directed by your health care provider. Make sure you finish it even if you start to feel better. SEEK IMMEDIATE MEDICAL CARE IF:   You experience severe cramps in your stomach, back, or abdomen.  You have an unexplained temperature (make sure to record these temperatures).  You pass large clots or tissue (save these for your health care provider to inspect).  Your bleeding increases.  You become light-headed, weak, or have fainting episodes. MAKE SURE YOU:   Understand these instructions.  Will watch your condition.  Will get help right away if you are not doing well or get worse. Document Released: 07/21/2005 Document Revised: 12/05/2013 Document Reviewed:   02/17/2013 Exit[employer] Patient Information 2015 [employer]. This information is not intended to replace advice given to you by your health care provider. Make sure you discuss any questions you have with your health care provider.  

## 2015-02-06 NOTE — MAU Note (Signed)
Pt for F/U U/S today, denies pain, was spotting yesterday, not today.

## 2015-02-07 ENCOUNTER — Encounter (HOSPITAL_COMMUNITY)
Admission: RE | Disposition: A | Discharge status: Home / Self Care | Payer: Self-pay | Source: Ambulatory Visit | Attending: Obstetrics & Gynecology

## 2015-02-07 ENCOUNTER — Ambulatory Visit (HOSPITAL_COMMUNITY): Payer: Medicaid Other | Admitting: Certified Registered Nurse Anesthetist

## 2015-02-07 ENCOUNTER — Encounter (HOSPITAL_COMMUNITY): Payer: Self-pay | Admitting: Certified Registered Nurse Anesthetist

## 2015-02-07 ENCOUNTER — Ambulatory Visit (HOSPITAL_COMMUNITY)
Admission: RE | Admit: 2015-02-07 | Discharge: 2015-02-07 | Disposition: A | Discharge status: Home / Self Care | Payer: Medicaid Other | Source: Ambulatory Visit | Attending: Obstetrics & Gynecology | Admitting: Obstetrics & Gynecology

## 2015-02-07 ENCOUNTER — Encounter (HOSPITAL_COMMUNITY)
Admission: AD | Disposition: A | Discharge status: Home / Self Care | Payer: Self-pay | Source: Ambulatory Visit | Attending: Family Medicine

## 2015-02-07 DIAGNOSIS — Z3A01 Less than 8 weeks gestation of pregnancy: Secondary | ICD-10-CM | POA: Insufficient documentation

## 2015-02-07 DIAGNOSIS — Z6841 Body Mass Index (BMI) 40.0 and over, adult: Secondary | ICD-10-CM | POA: Diagnosis not present

## 2015-02-07 DIAGNOSIS — Z85850 Personal history of malignant neoplasm of thyroid: Secondary | ICD-10-CM | POA: Diagnosis not present

## 2015-02-07 DIAGNOSIS — Z79899 Other long term (current) drug therapy: Secondary | ICD-10-CM | POA: Diagnosis not present

## 2015-02-07 DIAGNOSIS — O021 Missed abortion: Secondary | ICD-10-CM | POA: Insufficient documentation

## 2015-02-07 HISTORY — PX: DILATION AND EVACUATION: SHX1459

## 2015-02-07 SURGERY — DILATION AND EVACUATION, UTERUS
Anesthesia: Monitor Anesthesia Care

## 2015-02-07 SURGERY — DILATION AND EVACUATION, UTERUS
Anesthesia: General | Site: Vagina

## 2015-02-07 MED ORDER — SCOPOLAMINE 1 MG/3DAYS TD PT72
1.0000 | MEDICATED_PATCH | Freq: Once | TRANSDERMAL | Status: DC
  Administered 2015-02-07: 1.5 mg via TRANSDERMAL

## 2015-02-07 MED ORDER — SUCCINYLCHOLINE CHLORIDE 20 MG/ML IJ SOLN
INTRAMUSCULAR | Status: DC | PRN
  Administered 2015-02-07: 160 mg via INTRAVENOUS

## 2015-02-07 MED ORDER — LIDOCAINE HCL (CARDIAC) 20 MG/ML IV SOLN
INTRAVENOUS | Status: AC
  Filled 2015-02-07: qty 5

## 2015-02-07 MED ORDER — DEXAMETHASONE SODIUM PHOSPHATE 10 MG/ML IJ SOLN
INTRAMUSCULAR | Status: DC | PRN
  Administered 2015-02-07: 4 mg via INTRAVENOUS

## 2015-02-07 MED ORDER — IBUPROFEN 600 MG PO TABS: 600.0000 mg | ORAL_TABLET | Freq: Four times a day (QID) | ORAL | Status: DC | PRN

## 2015-02-07 MED ORDER — ONDANSETRON HCL 4 MG/2ML IJ SOLN: 4.0000 mg | Freq: Once | INTRAMUSCULAR | Status: DC | PRN

## 2015-02-07 MED ORDER — BUPIVACAINE HCL (PF) 0.5 % IJ SOLN
INTRAMUSCULAR | Status: DC | PRN
  Administered 2015-02-07: 20 mL

## 2015-02-07 MED ORDER — FENTANYL CITRATE (PF) 100 MCG/2ML IJ SOLN: 25.0000 ug | INTRAMUSCULAR | Status: DC | PRN

## 2015-02-07 MED ORDER — BUPIVACAINE HCL (PF) 0.5 % IJ SOLN
INTRAMUSCULAR | Status: AC
  Filled 2015-02-07: qty 30

## 2015-02-07 MED ORDER — FENTANYL CITRATE (PF) 250 MCG/5ML IJ SOLN
INTRAMUSCULAR | Status: AC
  Filled 2015-02-07: qty 5

## 2015-02-07 MED ORDER — MIDAZOLAM HCL 2 MG/2ML IJ SOLN
INTRAMUSCULAR | Status: DC | PRN
  Administered 2015-02-07: 2 mg via INTRAVENOUS

## 2015-02-07 MED ORDER — SCOPOLAMINE 1 MG/3DAYS TD PT72
MEDICATED_PATCH | TRANSDERMAL | Status: AC
  Administered 2015-02-07: 1.5 mg via TRANSDERMAL
  Filled 2015-02-07: qty 1

## 2015-02-07 MED ORDER — ONDANSETRON HCL 4 MG/2ML IJ SOLN
INTRAMUSCULAR | Status: DC | PRN
  Administered 2015-02-07: 4 mg via INTRAVENOUS

## 2015-02-07 MED ORDER — ROCURONIUM BROMIDE 100 MG/10ML IV SOLN
INTRAVENOUS | Status: DC | PRN
  Administered 2015-02-07: 5 mg via INTRAVENOUS

## 2015-02-07 MED ORDER — PROPOFOL 10 MG/ML IV BOLUS
INTRAVENOUS | Status: AC
  Filled 2015-02-07: qty 20

## 2015-02-07 MED ORDER — ROCURONIUM BROMIDE 100 MG/10ML IV SOLN
INTRAVENOUS | Status: AC
  Filled 2015-02-07: qty 1

## 2015-02-07 MED ORDER — SUCCINYLCHOLINE CHLORIDE 20 MG/ML IJ SOLN
INTRAMUSCULAR | Status: AC
  Filled 2015-02-07: qty 1

## 2015-02-07 MED ORDER — FENTANYL CITRATE (PF) 100 MCG/2ML IJ SOLN
INTRAMUSCULAR | Status: DC | PRN
  Administered 2015-02-07 (×5): 50 ug via INTRAVENOUS

## 2015-02-07 MED ORDER — MIDAZOLAM HCL 2 MG/2ML IJ SOLN
INTRAMUSCULAR | Status: AC
  Filled 2015-02-07: qty 2

## 2015-02-07 MED ORDER — ONDANSETRON HCL 4 MG/2ML IJ SOLN
INTRAMUSCULAR | Status: AC
  Filled 2015-02-07: qty 2

## 2015-02-07 MED ORDER — KETOROLAC TROMETHAMINE 30 MG/ML IJ SOLN
INTRAMUSCULAR | Status: DC | PRN
  Administered 2015-02-07: 30 mg via INTRAVENOUS

## 2015-02-07 MED ORDER — KETOROLAC TROMETHAMINE 30 MG/ML IJ SOLN
INTRAMUSCULAR | Status: AC
  Filled 2015-02-07: qty 1

## 2015-02-07 MED ORDER — LACTATED RINGERS IV SOLN
INTRAVENOUS | Status: DC
  Administered 2015-02-07: 09:00:00 via INTRAVENOUS

## 2015-02-07 MED ORDER — PROPOFOL 10 MG/ML IV BOLUS
INTRAVENOUS | Status: DC | PRN
  Administered 2015-02-07: 200 mg via INTRAVENOUS

## 2015-02-07 MED ORDER — DEXAMETHASONE SODIUM PHOSPHATE 4 MG/ML IJ SOLN
INTRAMUSCULAR | Status: AC
  Filled 2015-02-07: qty 1

## 2015-02-07 MED ORDER — LIDOCAINE HCL (CARDIAC) 20 MG/ML IV SOLN
INTRAVENOUS | Status: DC | PRN
  Administered 2015-02-07: 50 mg via INTRAVENOUS

## 2015-02-07 MED ORDER — DOXYCYCLINE HYCLATE 100 MG PO CAPS: 100.0000 mg | ORAL_CAPSULE | Freq: Two times a day (BID) | ORAL | Status: DC

## 2015-02-07 SURGICAL SUPPLY — 18 items
CATH ROBINSON RED A/P 16FR (CATHETERS) ×3 IMPLANT
CLOTH BEACON ORANGE TIMEOUT ST (SAFETY) ×3 IMPLANT
DECANTER SPIKE VIAL GLASS SM (MISCELLANEOUS) ×3 IMPLANT
GLOVE BIOGEL PI IND STRL 7.0 (GLOVE) ×1 IMPLANT
GLOVE BIOGEL PI INDICATOR 7.0 (GLOVE) ×2
GLOVE ECLIPSE 7.0 STRL STRAW (GLOVE) ×6 IMPLANT
GOWN STRL REUS W/TWL LRG LVL3 (GOWN DISPOSABLE) ×6 IMPLANT
KIT BERKELEY 1ST TRIMESTER 3/8 (MISCELLANEOUS) ×3 IMPLANT
NS IRRIG 1000ML POUR BTL (IV SOLUTION) ×3 IMPLANT
PACK VAGINAL MINOR WOMEN LF (CUSTOM PROCEDURE TRAY) ×3 IMPLANT
PAD OB MATERNITY 4.3X12.25 (PERSONAL CARE ITEMS) ×3 IMPLANT
PAD PREP 24X48 CUFFED NSTRL (MISCELLANEOUS) ×3 IMPLANT
SET BERKELEY SUCTION TUBING (SUCTIONS) ×3 IMPLANT
TOWEL OR 17X24 6PK STRL BLUE (TOWEL DISPOSABLE) ×6 IMPLANT
VACURETTE 10 RIGID CVD (CANNULA) IMPLANT
VACURETTE 7MM CVD STRL WRAP (CANNULA) IMPLANT
VACURETTE 8 RIGID CVD (CANNULA) IMPLANT
VACURETTE 9 RIGID CVD (CANNULA) IMPLANT

## 2015-02-07 NOTE — Anesthesia Preprocedure Evaluation (Addendum)
Anesthesia Evaluation  Patient identified by MRN, date of birth, ID band Patient awake    Reviewed: Allergy & Precautions, NPO status , Patient's Chart, lab work & pertinent test results  History of Anesthesia Complications Negative for: history of anesthetic complications  Airway Mallampati: III  TM Distance: >3 FB Neck ROM: Full    Dental no notable dental hx. (+) Dental Advisory Given   Pulmonary neg pulmonary ROS,  breath sounds clear to auscultation  Pulmonary exam normal       Cardiovascular negative cardio ROS Normal cardiovascular examRhythm:Regular Rate:Normal     Neuro/Psych negative neurological ROS  negative psych ROS   GI/Hepatic negative GI ROS, Neg liver ROS,   Endo/Other  Morbid obesity  Renal/GU negative Renal ROS  negative genitourinary   Musculoskeletal negative musculoskeletal ROS (+)   Abdominal   Peds negative pediatric ROS (+)  Hematology negative hematology ROS (+)   Anesthesia Other Findings   Reproductive/Obstetrics (+) Pregnancy                             Anesthesia Physical Anesthesia Plan  ASA: III  Anesthesia Plan: General   Post-op Pain Management:    Induction: Intravenous  Airway Management Planned: Oral ETT  Additional Equipment:   Intra-op Plan:   Post-operative Plan: Extubation in OR  Informed Consent: I have reviewed the patients History and Physical, chart, labs and discussed the procedure including the risks, benefits and alternatives for the proposed anesthesia with the patient or authorized representative who has indicated his/her understanding and acceptance.   Dental advisory given  Plan Discussed with: CRNA  Anesthesia Plan Comments:        Anesthesia Quick Evaluation

## 2015-02-07 NOTE — Anesthesia Procedure Notes (Signed)
Procedure Name: Intubation Date/Time: 02/07/2015 9:59 AM Performed by: Bufford Spikes Pre-anesthesia Checklist: Patient identified, Timeout performed, Emergency Drugs available, Suction available and Patient being monitored Patient Re-evaluated:Patient Re-evaluated prior to inductionOxygen Delivery Method: Circle system utilized Preoxygenation: Pre-oxygenation with 100% oxygen Intubation Type: IV induction Ventilation: Mask ventilation without difficulty Laryngoscope Size: Glidescope and 3 Grade View: Grade I Tube type: Oral Tube size: 7.0 mm Number of attempts: 1 Airway Equipment and Method: Patient positioned with wedge pillow and Rigid stylet Placement Confirmation: ETT inserted through vocal cords under direct vision,  breath sounds checked- equal and bilateral,  positive ETCO2 and CO2 detector Secured at: 21 cm Tube secured with: Tape Dental Injury: Teeth and Oropharynx as per pre-operative assessment

## 2015-02-07 NOTE — H&P (Signed)
Preoperative History and Physical  [name] is a 29 y.o. G2P1001 here for surgical management of missed abortion at 3 weeks.   Proposed surgery: first trimester dilation and evacaution    Past Medical History  Diagnosis Date  . Cancer 02/2012    thyroid cancer  . Thyroid disease     cancer   Past Surgical History  Procedure Laterality Date  . Thyroidectomy  02/2012    total L side first and R side 7 days later.  . Cesarean section  2006   OB History    Gravida Para Term Preterm AB TAB SAB Ectopic Multiple Living   2 1 1      1 1      Patient denies any cervical dysplasia or STIs. Prescriptions prior to admission  Medication Sig Dispense Refill Last Dose  . levothyroxine (SYNTHROID, LEVOTHROID) 200 MCG tablet Take 200 mcg by mouth daily before breakfast. Take two 100 mcg tablets daily.   02/02/2015 at Unknown time    No Known Allergies Social History:   reports that she has never smoked. She has never used smokeless tobacco. She reports that she does not drink alcohol or use illicit drugs. Family History  Problem Relation Age of Onset  . Hypertension Mother   . Blindness Mother     one eye    Review of Systems: Noncontributory  PHYSICAL EXAM: Blood pressure 141/96, pulse 92, temperature 97.5 F (36.4 C), temperature source Oral, resp. rate 18, height 5\' 1"  (1.549 m), weight 270 lb (122.471 kg), last menstrual period 12/27/2014, SpO2 100 %, unknown if currently breastfeeding. General appearance - alert, well appearing, and in no distress Chest - clear to auscultation, no wheezes, rales or rhonchi, symmetric air entry Heart - normal rate and regular rhythm Abdomen - soft, nontender, nondistended, no masses or organomegaly Pelvic - examination not indicated Extremities - peripheral pulses normal, no pedal edema, no clubbing or cyanosis  Labs: Results for orders placed or performed during the hospital encounter of 02/06/15 (from the past 336 hour(s))  CBC    Collection Time: 02/06/15  6:50 PM  Result Value Ref Range   WBC 12.1 (H) 4.0 - 10.5 K/uL   RBC 4.15 3.87 - 5.11 MIL/uL   Hemoglobin 12.5 12.0 - 15.0 g/dL   HCT 36.4 36.0 - 46.0 %   MCV 87.7 78.0 - 100.0 fL   MCH 30.1 26.0 - 34.0 pg   MCHC 34.3 30.0 - 36.0 g/dL   RDW 12.5 11.5 - 15.5 %   Platelets 240 150 - 400 K/uL  Type and screen for Red Blood Exchange   Collection Time: 02/06/15  6:50 PM  Result Value Ref Range   ABO/RH(D) O POS    Antibody Screen NEG    Sample Expiration 02/09/2015   Results for orders placed or performed in visit on 02/01/15 (from the past 336 hour(s))  POCT urinalysis dip (device)   Collection Time: 02/01/15  9:09 AM  Result Value Ref Range   Glucose, UA NEGATIVE NEGATIVE mg/dL   Bilirubin Urine NEGATIVE NEGATIVE   Ketones, ur NEGATIVE NEGATIVE mg/dL   Specific Gravity, Urine >=1.030 1.005 - 1.030   Hgb urine dipstick MODERATE (A) NEGATIVE   pH 5.5 5.0 - 8.0   Protein, ur NEGATIVE NEGATIVE mg/dL   Urobilinogen, UA 0.2 0.0 - 1.0 mg/dL   Nitrite NEGATIVE NEGATIVE   Leukocytes, UA NEGATIVE NEGATIVE  Results for orders placed or performed during the hospital encounter of 01/28/15 (from the past 336  hour(s))  Urinalysis, Routine w reflex microscopic (not at [facility])   Collection Time: 01/28/15  6:32 PM  Result Value Ref Range   Color, Urine YELLOW YELLOW   APPearance CLEAR CLEAR   Specific Gravity, Urine >1.030 (H) 1.005 - 1.030   pH 5.5 5.0 - 8.0   Glucose, UA NEGATIVE NEGATIVE mg/dL   Hgb urine dipstick LARGE (A) NEGATIVE   Bilirubin Urine NEGATIVE NEGATIVE   Ketones, ur 15 (A) NEGATIVE mg/dL   Protein, ur NEGATIVE NEGATIVE mg/dL   Urobilinogen, UA 0.2 0.0 - 1.0 mg/dL   Nitrite NEGATIVE NEGATIVE   Leukocytes, UA NEGATIVE NEGATIVE  Urine microscopic-add on   Collection Time: 01/28/15  6:32 PM  Result Value Ref Range   Squamous Epithelial / LPF RARE RARE   WBC, UA 0-2 <3 WBC/hpf   RBC / HPF 7-10 <3 RBC/hpf   Bacteria, UA MANY (A) RARE    Imaging  Studies: Dg Wrist Complete Left  2015/02/05   CLINICAL DATA:  Fall and radial wrist pain.  EXAM: LEFT WRIST - COMPLETE 3+ VIEW  COMPARISON:  None.  FINDINGS: Negative for fracture. There is evidence for subluxation at the first carpometacarpal joint. Normal alignment at the wrist. Soft tissues are unremarkable.  IMPRESSION: Subluxation at the first carpometacarpal joint.  No acute fracture.   Electronically Signed   By: Markus Daft M.D.   On: February 05, 2015 11:34   US Ob Comp Less 14 Wks  01/15/2015   CLINICAL DATA:  First trimester of pregnancy, lower abdominal pain.  EXAM: OBSTETRIC <14 WK [place] AND TRANSVAGINAL OB US  TECHNIQUE: Both transabdominal and transvaginal ultrasound examinations were performed for complete evaluation of the gestation as well as the maternal uterus, adnexal regions, and pelvic cul-de-sac. Transvaginal technique was performed to assess early pregnancy.  COMPARISON:  None.  FINDINGS: Intrauterine gestational sac: Visualized/normal in shape.  Yolk sac:  Visualized.  Embryo:  Visualized.  Cardiac Activity: Not visualized.  CRL:  3.3  mm   6 w   0 d                  [place] EDC: September 09, 2015  Maternal uterus/adnexae: Small subchorionic hemorrhage. Ovaries appear normal.  IMPRESSION: Probable early viable gestational sac with yolk sac and fetal pole, but no cardiac activity yet visualized. Small subchorionic hemorrhage is noted as well. Recommend follow-up quantitative B-HCG levels and follow-up US in 14 days to confirm and assess viability. This recommendation follows SRU consensus guidelines: Diagnostic Criteria for Nonviable Pregnancy Early in the First Trimester. [employer] Med 2013; 130:8657-84.   Electronically Signed   By: Marijo Conception, M.D.   On: 01/15/2015 21:17   US Ob Transvaginal  02/06/2015   CLINICAL DATA:  Follow-up viability. No fetal heart tones seen on prior ultrasound  EXAM: TRANSVAGINAL OB ULTRASOUND  TECHNIQUE: Transvaginal ultrasound was performed for complete evaluation of  the gestation as well as the maternal uterus, adnexal regions, and pelvic cul-de-sac.  COMPARISON:  01/28/2015  FINDINGS: Intrauterine gestational sac: Visualized  Yolk sac:  Visualized  Embryo:  Visualized  Cardiac Activity: Not visualized  Heart Rate:  bpm  MSD:   mm    w     d  CRL:   2.8  mm   6 w 0 d                  [place] EDC: 10/02/2015  Maternal uterus/adnexae: No subchorionic hemorrhage or adnexal mass. No free fluid.  IMPRESSION:  Crown-rump length has decreased in size. No interval growth. No fetal heart tones. Findings highly suspicious of failed pregnancy.   Electronically Signed   By: Rolm Baptise M.D.   On: 02/06/2015 15:46   US Ob Transvaginal  01/28/2015   CLINICAL DATA:  Vaginal bleeding in first-trimester pregnancy. Vaginal bleeding today.  EXAM: TRANSVAGINAL OB ULTRASOUND  TECHNIQUE: Transvaginal ultrasound was performed for complete evaluation of the gestation as well as the maternal uterus, adnexal regions, and pelvic cul-de-sac.  COMPARISON:  Obstetric ultrasound 01/23/2015 suspicious for failed pregnancy.  FINDINGS: Intrauterine gestational sac: Visualized/normal in shape.  Yolk sac: Visualized, currently measures 6 mm in diameter, previously 8 mm.  Embryo:  Visualized  Cardiac Activity: Not visualized.  CRL: 3.1 mm mm 6 w 0 d [place] EDC: 09/23/2015. No interval growth since prior exam.  Maternal uterus/adnexae: No subchorionic hemorrhage. Both ovaries are normal in size. No adnexal mass. No pelvic free fluid.  IMPRESSION: No interval growth in the fetal pole since prior exam. The yolk sac remains enlarged. No fetal heart tones. Findings are suspicious for failed pregnancy, however a follow-up exam in 5-9 days is again recommended for definitive diagnosis.   Electronically Signed   By: Jeb Levering M.D.   On: 01/28/2015 20:08   US Ob Transvaginal  01/23/2015   CLINICAL DATA:  Viability. No fetal heart tones on previous ultrasound.  EXAM: TRANSVAGINAL OB ULTRASOUND  TECHNIQUE: Transvaginal  ultrasound was performed for complete evaluation of the gestation as well as the maternal uterus, adnexal regions, and pelvic cul-de-sac.  COMPARISON:  01/15/2015  FINDINGS: Intrauterine gestational sac: Visualized/normal in shape.  Yolk sac:  Visualized, hydropic, measuring 8 mm in diameter.  Embryo:  Visualized  Cardiac Activity: Not visualized  Heart Rate:  bpm  MSD:   mm    w     d  CRL: 3.3 mm 6 w 0 d [place] EDC: 09/18/2015. No interval growth since prior study.  Maternal uterus/adnexae: Question small subchorionic hemorrhage. No adnexal masses. No free fluid.  IMPRESSION: No interval growth in the fetal pole since prior study. Yolk sac measures up to 8 mm in diameter. No fetal heart tones. Findings are suspicious but not yet definitive for failed pregnancy. Recommend follow-up US in 10-14 days for definitive diagnosis. This recommendation follows SRU consensus guidelines: Diagnostic Criteria for Nonviable Pregnancy Early in the First Trimester. [employer] Med 2013; 892:1194-17.   Electronically Signed   By: Rolm Baptise M.D.   On: 01/23/2015 16:33   US Ob Transvaginal  01/15/2015   CLINICAL DATA:  First trimester of pregnancy, lower abdominal pain.  EXAM: OBSTETRIC <14 WK [place] AND TRANSVAGINAL OB US  TECHNIQUE: Both transabdominal and transvaginal ultrasound examinations were performed for complete evaluation of the gestation as well as the maternal uterus, adnexal regions, and pelvic cul-de-sac. Transvaginal technique was performed to assess early pregnancy.  COMPARISON:  None.  FINDINGS: Intrauterine gestational sac: Visualized/normal in shape.  Yolk sac:  Visualized.  Embryo:  Visualized.  Cardiac Activity: Not visualized.  CRL:  3.3  mm   6 w   0 d                  [place] EDC: September 09, 2015  Maternal uterus/adnexae: Small subchorionic hemorrhage. Ovaries appear normal.  IMPRESSION: Probable early viable gestational sac with yolk sac and fetal pole, but no cardiac activity yet visualized. Small subchorionic  hemorrhage is noted as well. Recommend follow-up quantitative B-HCG levels and follow-up US in 14  days to confirm and assess viability. This recommendation follows SRU consensus guidelines: Diagnostic Criteria for Nonviable Pregnancy Early in the First Trimester. [employer] 2013; 947:0962-83.   Electronically Signed   By: Marijo Conception, M.D.   On: 01/15/2015 21:17   Dg Finger Thumb Left  02/04/2015   CLINICAL DATA:  Recent fall.  Pain in the left thumb.  EXAM: LEFT THUMB 2+V  COMPARISON:  Left wrist 02/04/2015  FINDINGS: There is no evidence of fracture or dislocation. There is no evidence of arthropathy or other focal bone abnormality. Soft tissues are unremarkable  IMPRESSION: Negative.   Electronically Signed   By: Markus Daft M.D.   On: 02/04/2015 11:30    Assessment: Missed abortion at 6 weeks by size.    Plan: Patient will undergo surgical management with dilation and evacuation.   The risks of surgery were discussed in detail with the patient including but not limited to: bleeding which may require transfusion or reoperation; infection which may require antibiotics; injury to surrounding organs which may involve bowel, bladder, ureters ; need for additional procedures including laparoscopy or laparotomy; thromboembolic phenomenon, surgical site problems and other postoperative/anesthesia complications. Likelihood of success in alleviating the patient's condition was discussed. Routine postoperative instructions will be reviewed with the patient and her family in detail after surgery.  The patient concurred with the proposed plan, giving informed written consent for the surgery.  Patient has been NPO since last night she will remain NPO for procedure.  Anesthesia and OR aware.  Preoperative prophylactic antibiotics and SCDs ordered on call to the OR.  To OR when ready.  Raveena Hebdon L. Ihor Dow, M.D., [facility] 02/07/2015 9:13 AM

## 2015-02-07 NOTE — Transfer of Care (Signed)
Immediate Anesthesia Transfer of Care Note  Patient: [name]  Procedure(s) Performed: Procedure(s): DILATATION AND EVACUATION (N/A)  Patient Location: PACU  Anesthesia Type:General  Level of Consciousness: awake, oriented and sedated  Airway & Oxygen Therapy: Patient Spontanous Breathing and Patient connected to nasal cannula oxygen  Post-op Assessment: Report given to RN and Post -op Vital signs reviewed and stable  Post vital signs: Reviewed and stable  Last Vitals:  Filed Vitals:   02/07/15 0834  BP: 141/96  Pulse: 92  Temp: 36.4 C  Resp: 18    Complications: No apparent anesthesia complications

## 2015-02-07 NOTE — Anesthesia Postprocedure Evaluation (Signed)
  Anesthesia Post-op Note  Patient: [name]  Procedure(s) Performed: Procedure(s) (LRB): DILATATION AND EVACUATION (N/A)  Patient Location: PACU  Anesthesia Type: General  Level of Consciousness: awake and alert   Airway and Oxygen Therapy: Patient Spontanous Breathing  Post-op Pain: mild  Post-op Assessment: Post-op Vital signs reviewed, Patient's Cardiovascular Status Stable, Respiratory Function Stable, Patent Airway and No signs of Nausea or vomiting  Last Vitals:  Filed Vitals:   02/07/15 1030  BP: 136/101  Pulse: 83  Temp: 36.7 C  Resp: 12    Post-op Vital Signs: stable   Complications: No apparent anesthesia complications

## 2015-02-07 NOTE — Brief Op Note (Signed)
02/07/2015  10:18 AM  PATIENT:  [name]  29 y.o. female  PRE-OPERATIVE DIAGNOSIS:  incompleted pregnancy  POST-OPERATIVE DIAGNOSIS:  incompleted pregnancy  PROCEDURE:  Procedure(s): DILATATION AND EVACUATION (N/A)  SURGEON:  Surgeon(s) and Role:    * Lavonia Drafts, MD - Primary  ANESTHESIA:   general and paracervical block  EBL:   <10cc  BLOOD ADMINISTERED:none  DRAINS: none   LOCAL MEDICATIONS USED:  MARCAINE     SPECIMEN:  Source of Specimen:  products of conception  DISPOSITION OF SPECIMEN:  PATHOLOGY  COUNTS:  YES  TOURNIQUET:  * No tourniquets in log *  DICTATION: .Note written in EPIC  PLAN OF CARE: Discharge to home after PACU  PATIENT DISPOSITION:  PACU - hemodynamically stable.   Delay start of Pharmacological VTE agent (>24hrs) due to surgical blood loss or risk of bleeding: not applicable  Complications: none immediate

## 2015-02-07 NOTE — Op Note (Signed)
02/07/2015  10:18 AM  PATIENT:  [name]  29 y.o. female  PRE-OPERATIVE DIAGNOSIS:  incompleted pregnancy  POST-OPERATIVE DIAGNOSIS:  incompleted pregnancy  PROCEDURE:  Procedure(s): DILATATION AND EVACUATION (N/A)  SURGEON:  Surgeon(s) and Role:    * Lavonia Drafts, MD - Primary  ANESTHESIA:   general and paracervical block  EBL:   <10cc  BLOOD ADMINISTERED:none  DRAINS: none   LOCAL MEDICATIONS USED:  MARCAINE     SPECIMEN:  Source of Specimen:  products of conception  DISPOSITION OF SPECIMEN:  PATHOLOGY  COUNTS:  YES  TOURNIQUET:  * No tourniquets in log *  DICTATION: .Note written in EPIC  PLAN OF CARE: Discharge to home after PACU  PATIENT DISPOSITION:  PACU - hemodynamically stable.   Delay start of Pharmacological VTE agent (>24hrs) due to surgical blood loss or risk of bleeding: not applicable  Complications: none immediate  INDICATIONS: 29 y.o. G2P1001 with MAB at [redacted] weeks gestation   Risks of surgery were discussed with the patient including but not limited to: bleeding which may require transfusion; infection which may require antibiotics; injury to uterus or surrounding organs; need for additional procedures including laparotomy or laparoscopy; possibility of intrauterine scarring which may impair future fertility; and other postoperative/anesthesia complications. Written informed consent was obtained.    FINDINGS:  A 6-8 week size uterus, moderate amounts of products of conception, specimen sent to pathology.  PROCEDURE DETAILS:  The patient was taken to the operating room where monitored intravenous sedation was administered and was found to be adequate.  After an adequate timeout was performed, she was placed in the dorsal lithotomy position and examined; then prepped and draped in the sterile manner.   Her bladder was catheterized for an unmeasured amount of clear, yellow urine. A vaginal speculum was then placed in the patient's vagina  and a single tooth tenaculum was applied to the anterior lip of the cervix.  A paracervical block using 20 ml of 0.5% Marcaine was administered. The cervix was gently dilated to accommodate a 6 mm suction curette that was gently advanced to the uterine fundus.  The suction device was then activated to a suction of 50-74mmHg and curette slowly rotated to clear the uterus of products of conception.  A sharp curettage was then performed to confirm complete emptying of the uterus. There was minimal bleeding noted and the tenaculum removed with good hemostasis noted.   All instruments were removed from the patient's vagina. The patient tolerated the procedure well and was taken to the recovery area awake, and in stable condition.  The patient will be discharged to home as per PACU criteria.  Routine postoperative instructions given.  She was prescribed Ibuprofen and Doxycycline.  She will follow up in the clinic in 4 weeks for postoperative evaluation.

## 2015-02-07 NOTE — Discharge Instructions (Signed)
Dilation and Curettage or Vacuum Curettage, Care After  Refer to this sheet in the next few weeks. These instructions provide you with information on caring for yourself after your procedure. Your health care provider may also give you more specific instructions. Your treatment has been planned according to current medical practices, but problems sometimes occur. Call your health care provider if you have any problems or questions after your procedure.  WHAT TO EXPECT AFTER THE PROCEDURE  After your procedure, it is typical to have light cramping and bleeding. This may last for 2 days to 2 weeks after the procedure.  HOME CARE INSTRUCTIONS   · Do not drive for 24 hours.  · Wait 1 week before returning to strenuous activities.  · Take your temperature 2 times a day for 4 days and write it down. Provide these temperatures to your health care provider if you develop a fever.  · Avoid long periods of standing.  · Avoid heavy lifting, pushing, or pulling. Do not lift anything heavier than 10 pounds (4.5 kg).  · Limit stair climbing to once or twice a day.  · Take rest periods often.  · You may resume your usual diet.  · Drink enough fluids to keep your urine clear or pale yellow.  · Your usual bowel function should return. If you have constipation, you may:  ¨ Take a mild laxative with permission from your health care provider.  ¨ Add fruit and bran to your diet.  ¨ Drink more fluids.  · Take showers instead of baths until your health care provider gives you permission to take baths.  · Do not go swimming or use a hot tub until your health care provider approves.  · Try to have someone with you or available to you the first 24-48 hours, especially if you were given a general anesthetic.  · Do not douche, use tampons, or have sex (intercourse) for 2 weeks after the procedure.  · Only take over-the-counter or prescription medicines as directed by your health care provider. Do not take aspirin. It can cause  bleeding.  · Follow up with your health care provider as directed.  SEEK MEDICAL CARE IF:   · You have increasing cramps or pain that is not relieved with medicine.  · You have abdominal pain that does not seem to be related to the same area of earlier cramping and pain.  · You have bad smelling vaginal discharge.  · You have a rash.  · You are having problems with any medicine.  SEEK IMMEDIATE MEDICAL CARE IF:   · You have bleeding that is heavier than a normal menstrual period.  · You have a fever.  · You have chest pain.  · You have shortness of breath.  · You feel dizzy or feel like fainting.  · You pass out.  · You have pain in your shoulder strap area.  · You have heavy vaginal bleeding with or without blood clots.  MAKE SURE YOU:   · Understand these instructions.  · Will watch your condition.  · Will get help right away if you are not doing well or get worse.  Document Released: 07/18/2000 Document Revised: 07/26/2013 Document Reviewed: 02/17/2013  ExitCare® Patient Information ©2015 [employer]. This information is not intended to replace advice given to you by your health care provider. Make sure you discuss any questions you have with your health care provider.

## 2015-02-09 ENCOUNTER — Encounter (HOSPITAL_COMMUNITY): Payer: Self-pay | Admitting: Obstetrics & Gynecology

## 2015-02-12 ENCOUNTER — Other Ambulatory Visit: Payer: Self-pay | Admitting: Orthopaedic Surgery

## 2015-02-12 DIAGNOSIS — M79645 Pain in left finger(s): Secondary | ICD-10-CM

## 2015-02-24 ENCOUNTER — Other Ambulatory Visit: Payer: Medicaid Other

## 2015-03-07 ENCOUNTER — Ambulatory Visit: Payer: Medicaid Other | Admitting: Obstetrics & Gynecology

## 2015-04-20 ENCOUNTER — Encounter (HOSPITAL_COMMUNITY): Payer: Self-pay | Admitting: Emergency Medicine

## 2015-04-20 ENCOUNTER — Emergency Department (HOSPITAL_COMMUNITY): Payer: Medicaid Other

## 2015-04-20 ENCOUNTER — Emergency Department (HOSPITAL_COMMUNITY)
Admission: EM | Admit: 2015-04-20 | Discharge: 2015-04-20 | Disposition: A | Discharge status: Home / Self Care | Payer: Medicaid Other | Attending: Emergency Medicine | Admitting: Emergency Medicine

## 2015-04-20 DIAGNOSIS — Z85850 Personal history of malignant neoplasm of thyroid: Secondary | ICD-10-CM | POA: Insufficient documentation

## 2015-04-20 DIAGNOSIS — Z79899 Other long term (current) drug therapy: Secondary | ICD-10-CM | POA: Insufficient documentation

## 2015-04-20 DIAGNOSIS — R51 Headache: Secondary | ICD-10-CM | POA: Diagnosis not present

## 2015-04-20 DIAGNOSIS — B379 Candidiasis, unspecified: Secondary | ICD-10-CM | POA: Insufficient documentation

## 2015-04-20 DIAGNOSIS — Z3202 Encounter for pregnancy test, result negative: Secondary | ICD-10-CM | POA: Diagnosis not present

## 2015-04-20 DIAGNOSIS — R079 Chest pain, unspecified: Secondary | ICD-10-CM | POA: Diagnosis present

## 2015-04-20 DIAGNOSIS — R42 Dizziness and giddiness: Secondary | ICD-10-CM | POA: Insufficient documentation

## 2015-04-20 HISTORY — DX: Anxiety disorder, unspecified: F41.9

## 2015-04-20 LAB — I-STAT CHEM 8, ED
BUN: 10 mg/dL (ref 6–20)
CHLORIDE: 108 mmol/L (ref 101–111)
CREATININE: 0.7 mg/dL (ref 0.44–1.00)
Calcium, Ion: 1.1 mmol/L — ABNORMAL LOW (ref 1.12–1.23)
Glucose, Bld: 88 mg/dL (ref 65–99)
HCT: 38 % (ref 36.0–46.0)
Hemoglobin: 12.9 g/dL (ref 12.0–15.0)
POTASSIUM: 4 mmol/L (ref 3.5–5.1)
Sodium: 137 mmol/L (ref 135–145)
TCO2: 26 mmol/L (ref 0–100)

## 2015-04-20 LAB — I-STAT BETA HCG BLOOD, ED (MC, WL, AP ONLY): I-stat hCG, quantitative: <5 m[IU]/mL (ref <5)

## 2015-04-20 MED ORDER — FLUCONAZOLE 150 MG PO TABS: 150.0000 mg | ORAL_TABLET | Freq: Every day | ORAL | Status: AC

## 2015-04-20 NOTE — Discharge Instructions (Signed)
Chest Pain (Nonspecific) °It is often hard to give a specific diagnosis for the cause of chest pain. There is always a chance that your pain could be related to something serious, such as a heart attack or a blood clot in the lungs. You need to follow up with your health care provider for further evaluation. °CAUSES  °· Heartburn. °· Pneumonia or bronchitis. °· Anxiety or stress. °· Inflammation around your heart (pericarditis) or lung (pleuritis or pleurisy). °· A blood clot in the lung. °· A collapsed lung (pneumothorax). It can develop suddenly on its own (spontaneous pneumothorax) or from trauma to the chest. °· Shingles infection (herpes zoster virus). °The chest wall is composed of bones, muscles, and cartilage. Any of these can be the source of the pain. °· The bones can be bruised by injury. °· The muscles or cartilage can be strained by coughing or overwork. °· The cartilage can be affected by inflammation and become sore (costochondritis). °DIAGNOSIS  °Lab tests or other studies may be needed to find the cause of your pain. Your health care provider may have you take a test called an ambulatory electrocardiogram (ECG). An ECG records your heartbeat patterns over a 24-hour period. You may also have other tests, such as: °· Transthoracic echocardiogram (TTE). During echocardiography, sound waves are used to evaluate how blood flows through your heart. °· Transesophageal echocardiogram (TEE). °· Cardiac monitoring. This allows your health care provider to monitor your heart rate and rhythm in real time. °· Holter monitor. This is a portable device that records your heartbeat and can help diagnose heart arrhythmias. It allows your health care provider to track your heart activity for several days, if needed. °· Stress tests by exercise or by giving medicine that makes the heart beat faster. °TREATMENT  °· Treatment depends on what may be causing your chest pain. Treatment may include: °· Acid blockers for  heartburn. °· Anti-inflammatory medicine. °· Pain medicine for inflammatory conditions. °· Antibiotics if an infection is present. °· You may be advised to change lifestyle habits. This includes stopping smoking and avoiding alcohol, caffeine, and chocolate. °· You may be advised to keep your head raised (elevated) when sleeping. This reduces the chance of acid going backward from your stomach into your esophagus. °Most of the time, nonspecific chest pain will improve within 2-3 days with rest and mild pain medicine.  °HOME CARE INSTRUCTIONS  °· If antibiotics were prescribed, take them as directed. Finish them even if you start to feel better. °· For the next few days, avoid physical activities that bring on chest pain. Continue physical activities as directed. °· Do not use any tobacco products, including cigarettes, chewing tobacco, or electronic cigarettes. °· Avoid drinking alcohol. °· Only take medicine as directed by your health care provider. °· Follow your health care provider's suggestions for further testing if your chest pain does not go away. °· Keep any follow-up appointments you made. If you do not go to an appointment, you could develop lasting (chronic) problems with pain. If there is any problem keeping an appointment, call to reschedule. °SEEK MEDICAL CARE IF:  °· Your chest pain does not go away, even after treatment. °· You have a rash with blisters on your chest. °· You have a fever. °SEEK IMMEDIATE MEDICAL CARE IF:  °· You have increased chest pain or pain that spreads to your arm, neck, jaw, back, or abdomen. °· You have shortness of breath. °· You have an increasing cough, or you cough   up blood. °· You have severe back or abdominal pain. °· You feel nauseous or vomit. °· You have severe weakness. °· You faint. °· You have chills. °This is an emergency. Do not wait to see if the pain will go away. Get medical help at once. Call your local emergency services (911 in [place] Do not drive  yourself to the hospital. °MAKE SURE YOU:  °· Understand these instructions. °· Will watch your condition. °· Will get help right away if you are not doing well or get worse. °Document Released: 04/30/2005 Document Revised: 07/26/2013 Document Reviewed: 02/24/2008 °ExitCare® Patient Information ©2015 [employer]. This information is not intended to replace advice given to you by your health care provider. Make sure you discuss any questions you have with your health care provider. °Candida Infection °A Candida infection (also called yeast, fungus, and Monilia infection) is an overgrowth of yeast that can occur anywhere on the body. A yeast infection commonly occurs in warm, moist body areas. Usually, the infection remains localized but can spread to become a systemic infection. A yeast infection may be a sign of a more severe disease such as diabetes, leukemia, or AIDS. °A yeast infection can occur in both men and women. In women, Candida vaginitis is a vaginal infection. It is one of the most common causes of vaginitis. Men usually do not have symptoms or know they have an infection until other problems develop. Men may find out they have a yeast infection because their sex partner has a yeast infection. Uncircumcised men are more likely to get a yeast infection than circumcised men. This is because the uncircumcised glans is not exposed to air and does not remain as dry as that of a circumcised glans. Older adults may develop yeast infections around dentures. °CAUSES  °Women °· Antibiotics. °· Steroid medication taken for a long time. °· Being overweight (obese). °· Diabetes. °· Poor immune condition. °· Certain serious medical conditions. °· Immune suppressive medications for organ transplant patients. °· Chemotherapy. °· Pregnancy. °· Menstruation. °· Stress and fatigue. °· Intravenous drug use. °· Oral contraceptives. °· Wearing tight-fitting clothes in the crotch area. °· Catching it from a sex partner who  has a yeast infection. °· Spermicide. °· Intravenous, urinary, or other catheters. °Men °· Catching it from a sex partner who has a yeast infection. °· Having oral or anal sex with a person who has the infection. °· Spermicide. °· Diabetes. °· Antibiotics. °· Poor immune system. °· Medications that suppress the immune system. °· Intravenous drug use. °· Intravenous, urinary, or other catheters. °SYMPTOMS  °Women °· Thick, white vaginal discharge. °· Vaginal itching. °· Redness and swelling in and around the vagina. °· Irritation of the lips of the vagina and perineum. °· Blisters on the vaginal lips and perineum. °· Painful sexual intercourse. °· Low blood sugar (hypoglycemia). °· Painful urination. °· Bladder infections. °· Intestinal problems such as constipation, indigestion, bad breath, bloating, increase in gas, diarrhea, or loose stools. °Men °· Men may develop intestinal problems such as constipation, indigestion, bad breath, bloating, increase in gas, diarrhea, or loose stools. °· Dry, cracked skin on the penis with itching or discomfort. °· Jock itch. °· Dry, flaky skin. °· Athlete's foot. °· Hypoglycemia. °DIAGNOSIS  °Women °· A history and an exam are performed. °· The discharge may be examined under a microscope. °· A culture may be taken of the discharge. °Men °· A history and an exam are performed. °· Any discharge from the penis or   areas of cracked skin will be looked at under the microscope and cultured. °· Stool samples may be cultured. °TREATMENT  °Women °· Vaginal antifungal suppositories and creams. °· Medicated creams to decrease irritation and itching on the outside of the vagina. °· Warm compresses to the perineal area to decrease swelling and discomfort. °· Oral antifungal medications. °· Medicated vaginal suppositories or cream for repeated or recurrent infections. °· Wash and dry the irritation areas before applying the cream. °· Eating yogurt with Lactobacillus may help with prevention and  treatment. °· Sometimes painting the vagina with gentian violet solution may help if creams and suppositories do not work. °Men °· Antifungal creams and oral antifungal medications. °· Sometimes treatment must continue for 30 days after the symptoms go away to prevent recurrence. °HOME CARE INSTRUCTIONS  °Women °· Use cotton underwear and avoid tight-fitting clothing. °· Avoid colored, scented toilet paper and deodorant tampons or pads. °· Do not douche. °· Keep your diabetes under control. °· Finish all the prescribed medications. °· Keep your skin clean and dry. °· Consume milk or yogurt with Lactobacillus-active culture regularly. If you get frequent yeast infections and think that is what the infection is, there are over-the-counter medications that you can get. If the infection does not show healing in 3 days, talk to your caregiver. °· Tell your sex partner you have a yeast infection. Your partner may need treatment also, especially if your infection does not clear up or recurs. °Men °· Keep your skin clean and dry. °· Keep your diabetes under control. °· Finish all prescribed medications. °· Tell your sex partner that you have a yeast infection so he or she can be treated if necessary. °SEEK MEDICAL CARE IF:  °· Your symptoms do not clear up or worsen in one week after treatment. °· You have an oral temperature above 102° F (38.9° C). °· You have trouble swallowing or eating for a prolonged time. °· You develop blisters on and around your vagina. °· You develop vaginal bleeding and it is not your menstrual period. °· You develop abdominal pain. °· You develop intestinal problems as mentioned above. °· You get weak or light-headed. °· You have painful or increased urination. °· You have pain during sexual intercourse. °MAKE SURE YOU:  °· Understand these instructions. °· Will watch your condition. °· Will get help right away if you are not doing well or get worse. °Document Released: 08/28/2004 Document  Revised: 12/05/2013 Document Reviewed: 12/10/2009 °ExitCare® Patient Information ©2015 [employer]. This information is not intended to replace advice given to you by your health care provider. Make sure you discuss any questions you have with your health care provider. ° ° °

## 2015-04-20 NOTE — ED Provider Notes (Signed)
CSN: [id number]     Arrival date & time 04/20/15  7062 History   First MD Initiated Contact with Patient 04/20/15 [phone]     Chief Complaint  Patient presents with  . Chest Pain  . Dizziness  . Vaginitis  . Possible Pregnancy  . Foot Swelling    HPI   Patient presents to the emergency room for evaluation of primarily chest pain. She states she said some pain in the center of her chest. It's sharp and feels like a lump in her chest. Symptoms started a couple days ago. She is also felt intermittently dizzy especially when she bends over. She's had some intermittent swelling in her feet and seems to be related to salty foods. She denies any shortness of breath. She denies any fevers or chills or coughing.  Patient also mentions some vaginal discharge. When she was taking her shower this morning she noticed a whitish discharge in the vaginal area. She denies any pain or discomfort. She is 11 days late for her menses. Past Medical History  Diagnosis Date  . Cancer 02/2012    thyroid cancer  . Thyroid disease     cancer  . Anxiety    Past Surgical History  Procedure Laterality Date  . Thyroidectomy  02/2012    total L side first and R side 7 days later.  . Cesarean section  2006  . Dilation and evacuation N/A 02/07/2015    Procedure: DILATATION AND EVACUATION;  Surgeon: Lavonia Drafts, MD;  Location: [facility];  Service: Gynecology;  Laterality: N/A;   Family History  Problem Relation Age of Onset  . Hypertension Mother   . Blindness Mother     one eye   Social History  Substance Use Topics  . Smoking status: Never Smoker   . Smokeless tobacco: Never Used  . Alcohol Use: Yes     Comment: social   OB History    Gravida Para Term Preterm AB TAB SAB Ectopic Multiple Living   2 1 1      1 1      Review of Systems  Constitutional: Negative for fever.  HENT: Negative for voice change.   Respiratory: Negative for shortness of breath.   Cardiovascular: Positive for chest pain.   Gastrointestinal: Negative for vomiting and abdominal pain.  Genitourinary:       When she was showering this am she noticed some white discharge, no vaginal pain or discomfort, she wondered if she is developing a yeast infection   Musculoskeletal:       Her feet feel swollen   Neurological: Positive for light-headedness (when standing up) and headaches. Negative for tremors, speech difficulty and weakness.  All other systems reviewed and are negative.     Allergies  Review of patient's allergies indicates no known allergies.  Home Medications   Prior to Admission medications   Medication Sig Start Date End Date Taking? Authorizing Provider  levothyroxine (SYNTHROID, LEVOTHROID) 200 MCG tablet Take 200 mcg by mouth daily before breakfast.    Yes Historical Provider, MD  doxycycline (VIBRAMYCIN) 100 MG capsule Take 1 capsule (100 mg total) by mouth 2 (two) times daily. Patient not taking: Reported on 04/20/2015 02/07/15   Lavonia Drafts, MD  ibuprofen (ADVIL,MOTRIN) 600 MG tablet Take 1 tablet (600 mg total) by mouth every 6 (six) hours as needed. Patient not taking: Reported on 04/20/2015 02/07/15   Lavonia Drafts, MD   BP 150/97 mmHg  Pulse 81  Temp(Src) 97.9 F (36.6 C) (Oral)  Resp 17  Ht 5\' 2"  (1.575 m)  SpO2 100%  LMP 03/09/2015 (Exact Date)  Breastfeeding? Unknown Physical Exam  Constitutional: She appears well-developed and well-nourished. No distress.  HENT:  Head: Normocephalic and atraumatic.  Right Ear: External ear normal.  Left Ear: External ear normal.  Eyes: Conjunctivae are normal. Right eye exhibits no discharge. Left eye exhibits no discharge. No scleral icterus.  Neck: Neck supple. No tracheal deviation present.  Cardiovascular: Normal rate, regular rhythm and intact distal pulses.   Pulmonary/Chest: Effort normal and breath sounds normal. No stridor. No respiratory distress. She has no wheezes. She has no rales. She exhibits tenderness.   Abdominal: Soft. Bowel sounds are normal. She exhibits no distension. There is no tenderness. There is no rebound and no guarding.  Genitourinary:  Slight amount of white exudate on the labia and mucosal service or vagina  Musculoskeletal: She exhibits no edema or tenderness.  Neurological: She is alert. She has normal strength. No cranial nerve deficit (no facial droop, extraocular movements intact, no slurred speech) or sensory deficit. She exhibits normal muscle tone. She displays no seizure activity. Coordination normal.  Skin: Skin is warm and dry. No rash noted.  Psychiatric: She has a normal mood and affect.  Nursing note and vitals reviewed.   ED Course  Procedures (including critical care time) Labs Review Labs Reviewed  I-STAT CHEM 8, ED  I-STAT BETA HCG BLOOD, ED ([facility], [facility], [facility] ONLY)    Imaging Review No results found. I have personally reviewed and evaluated these images and lab results as part of my medical decision-making.   EKG Interpretation   Date/Time:  Friday April 20 2015 08:45:36 EDT Ventricular Rate:  85 PR Interval:  173 QRS Duration: 86 QT Interval:  366 QTC Calculation: 435 R Axis:   26 Text Interpretation:  Sinus rhythm Borderline T wave abnormalities  Baseline wander in lead(s) V3 No significant change since last tracing  Confirmed by Mushka Laconte  MD-J, Tenia Goh ([id number]) on 04/20/2015 9:09:09 AM      MDM   Final diagnoses:  Chest pain, unspecified chest pain type  Yeast infection   Atypical chest pain.  Non cardiac.  Not on birth control pills.  Perc negative.   Will rx diflucan for yeast infection.    Dorie Rank, MD 04/20/15 [phone]

## 2015-04-20 NOTE — ED Notes (Signed)
Patient c/o mid chest pain, dizziness when she bends over, swelling in her feet when she walks, a yeast infection, and possible pregnancy. Patient states she has a hx of anxiety attacks but that this is different, pain is central chest and feels like a lump. Patient states she noticed her yeast infection symptoms this am, and she states her menstrual cycle is 11 days late.

## 2015-07-01 ENCOUNTER — Encounter (HOSPITAL_COMMUNITY): Payer: Self-pay | Admitting: Emergency Medicine

## 2015-07-01 ENCOUNTER — Emergency Department (INDEPENDENT_AMBULATORY_CARE_PROVIDER_SITE_OTHER)
Admission: EM | Admit: 2015-07-01 | Discharge: 2015-07-01 | Disposition: A | Discharge status: Home / Self Care | Payer: Medicaid Other | Source: Home / Self Care | Attending: Emergency Medicine | Admitting: Emergency Medicine

## 2015-07-01 ENCOUNTER — Emergency Department (INDEPENDENT_AMBULATORY_CARE_PROVIDER_SITE_OTHER): Payer: Medicaid Other

## 2015-07-01 DIAGNOSIS — R1032 Left lower quadrant pain: Secondary | ICD-10-CM

## 2015-07-01 LAB — POCT URINALYSIS DIP (DEVICE)
BILIRUBIN URINE: NEGATIVE
GLUCOSE, UA: NEGATIVE mg/dL
Ketones, ur: NEGATIVE mg/dL
LEUKOCYTES UA: NEGATIVE
NITRITE: NEGATIVE
PH: 5.5 (ref 5.0–8.0)
Protein, ur: NEGATIVE mg/dL
Specific Gravity, Urine: >=1.03 (ref 1.005–1.030)
Urobilinogen, UA: 0.2 mg/dL (ref 0.0–1.0)

## 2015-07-01 LAB — POCT PREGNANCY, URINE: PREG TEST UR: NEGATIVE

## 2015-07-01 MED ORDER — IBUPROFEN 600 MG PO TABS: 600.0000 mg | ORAL_TABLET | Freq: Four times a day (QID) | ORAL | Status: DC | PRN

## 2015-07-01 MED ORDER — TRAMADOL HCL 50 MG PO TABS: 50.0000 mg | ORAL_TABLET | Freq: Four times a day (QID) | ORAL | Status: DC | PRN

## 2015-07-01 NOTE — ED Notes (Signed)
C/o LLQ pain onset yest associated w/urinary freq Pain increases w/activity Last BM was yest; regular for her Denies fevers, chills A&O x4... No acute distress.

## 2015-07-01 NOTE — ED Provider Notes (Signed)
CSN: [id number]     Arrival date & time 07/01/15  1300 History   First MD Initiated Contact with Patient 07/01/15 1309     Chief Complaint  Patient presents with  . Abdominal Pain   (Consider location/radiation/quality/duration/timing/severity/associated sxs/prior Treatment) HPI She is a 29 year old woman here for evaluation of left lower quadrant pain. She states this started yesterday. It is a crampy pain that comes and goes. It seemed to be worse overnight. It is associated with urinary frequency but no dysuria. She states the urine looked a little darker than normal. She also reports nausea yesterday, but no vomiting. No fevers or chills. No flank pain. She reports normal bowel movements. No vaginal discharge.  Past Medical History  Diagnosis Date  . Cancer ([place]) 02/2012    thyroid cancer  . Thyroid disease     cancer  . Anxiety    Past Surgical History  Procedure Laterality Date  . Thyroidectomy  02/2012    total L side first and R side 7 days later.  . Cesarean section  2006  . Dilation and evacuation N/A 02/07/2015    Procedure: DILATATION AND EVACUATION;  Surgeon: Lavonia Drafts, MD;  Location: [facility];  Service: Gynecology;  Laterality: N/A;   Family History  Problem Relation Age of Onset  . Hypertension Mother   . Blindness Mother     one eye   Social History  Substance Use Topics  . Smoking status: Never Smoker   . Smokeless tobacco: Never Used  . Alcohol Use: Yes     Comment: social   OB History    Gravida Para Term Preterm AB TAB SAB Ectopic Multiple Living   2 1 1      1 1      Review of Systems As in history of present illness Allergies  Review of patient's allergies indicates no known allergies.  Home Medications   Prior to Admission medications   Medication Sig Start Date End Date Taking? Authorizing Provider  levothyroxine (SYNTHROID, LEVOTHROID) 200 MCG tablet Take 200 mcg by mouth daily before breakfast.    Yes Historical Provider, MD   ibuprofen (ADVIL,MOTRIN) 600 MG tablet Take 1 tablet (600 mg total) by mouth every 6 (six) hours as needed for moderate pain. 07/01/15   Melony Overly, MD  traMADol (ULTRAM) 50 MG tablet Take 1 tablet (50 mg total) by mouth every 6 (six) hours as needed. 07/01/15   Melony Overly, MD   Meds Ordered and Administered this Visit  Medications - No data to display  BP 124/84 mmHg  Pulse 101  Temp(Src) 98.1 F (36.7 C) (Oral)  Resp 18  SpO2 100%  LMP 06/15/2015 No data found.   Physical Exam  Constitutional: She is oriented to person, place, and time. She appears well-developed and well-nourished. No distress.  Neck: Neck supple.  Cardiovascular:  Mild tachycardia  Pulmonary/Chest: Effort normal.  Abdominal: Soft. She exhibits no distension and no mass. There is no tenderness. There is no rebound and no guarding.  No CVA tenderness  Neurological: She is alert and oriented to person, place, and time.    ED Course  Procedures (including critical care time)  Labs Review Labs Reviewed  POCT URINALYSIS DIP (DEVICE) - Abnormal; Notable for the following:    Hgb urine dipstick MODERATE (*)    All other components within normal limits  POCT PREGNANCY, URINE    Imaging Review Dg Abd 1 View  07/01/2015  CLINICAL DATA:  Left lower quadrant abdominal  pain for 2 days. EXAM: ABDOMEN - 1 VIEW COMPARISON:  CT abdomen and pelvis 04/09/2013 FINDINGS: There is a mild to moderate amount of stool in the colon, greatest proximally. No dilated loops of bowel are seen to suggest obstruction. No gross intraperitoneal free air on this supine only examination. No abnormal soft tissue calcification or acute osseous abnormality is seen. The visualized lung bases are grossly clear. IMPRESSION: Negative. Electronically Signed   By: Logan Bores M.D.   On: 07/01/2015 14:13      MDM   1. LLQ pain    Ovarian cyst versus kidney stone. Symptomatic treatment with fluids, ibuprofen, and tramadol. Return  precautions reviewed. If not improving in the next week, she will follow-up with her PCP for additional evaluation.    Melony Overly, MD 07/01/15 [phone]

## 2015-07-01 NOTE — Discharge Instructions (Signed)
You likely have a kidney stone or an ovarian cyst. Take ibuprofen 600 mg every 6 hours for the pain. Make sure you are drinking lots of water. Use the tramadol every 6-8 hours as needed for severe pain. If your pain is getting worse, you are vomiting, or you get a fever, please go to the emergency room. If things are not improving over the next week, please follow-up with your primary care doctor for additional workup.

## 2015-08-02 ENCOUNTER — Other Ambulatory Visit (HOSPITAL_COMMUNITY): Payer: Self-pay | Admitting: Endocrinology

## 2015-08-02 DIAGNOSIS — C73 Malignant neoplasm of thyroid gland: Secondary | ICD-10-CM

## 2015-08-06 ENCOUNTER — Inpatient Hospital Stay (HOSPITAL_COMMUNITY)
Admission: AD | Admit: 2015-08-06 | Discharge: 2015-08-06 | Disposition: A | Discharge status: Home / Self Care | Payer: Medicaid Other | Source: Ambulatory Visit | Attending: Family Medicine | Admitting: Family Medicine

## 2015-08-06 ENCOUNTER — Encounter (HOSPITAL_COMMUNITY): Payer: Self-pay | Admitting: *Deleted

## 2015-08-06 DIAGNOSIS — Z85850 Personal history of malignant neoplasm of thyroid: Secondary | ICD-10-CM | POA: Diagnosis not present

## 2015-08-06 DIAGNOSIS — N390 Urinary tract infection, site not specified: Secondary | ICD-10-CM | POA: Insufficient documentation

## 2015-08-06 DIAGNOSIS — F419 Anxiety disorder, unspecified: Secondary | ICD-10-CM | POA: Diagnosis not present

## 2015-08-06 DIAGNOSIS — N898 Other specified noninflammatory disorders of vagina: Secondary | ICD-10-CM | POA: Diagnosis not present

## 2015-08-06 LAB — URINE MICROSCOPIC-ADD ON

## 2015-08-06 LAB — URINALYSIS, ROUTINE W REFLEX MICROSCOPIC
Bilirubin Urine: NEGATIVE
Glucose, UA: NEGATIVE mg/dL
KETONES UR: NEGATIVE mg/dL
NITRITE: NEGATIVE
Protein, ur: NEGATIVE mg/dL
Specific Gravity, Urine: >1.03 — ABNORMAL HIGH (ref 1.005–1.030)
pH: 5.5 (ref 5.0–8.0)

## 2015-08-06 LAB — POCT PREGNANCY, URINE: PREG TEST UR: NEGATIVE

## 2015-08-06 LAB — WET PREP, GENITAL
Clue Cells Wet Prep HPF POC: NONE SEEN
Sperm: NONE SEEN
Trich, Wet Prep: NONE SEEN
YEAST WET PREP: NONE SEEN

## 2015-08-06 MED ORDER — SULFAMETHOXAZOLE-TRIMETHOPRIM 800-160 MG PO TABS: 1.0000 | ORAL_TABLET | Freq: Two times a day (BID) | ORAL | Status: AC

## 2015-08-06 MED ORDER — FLUCONAZOLE 150 MG PO TABS: 150.0000 mg | ORAL_TABLET | Freq: Every day | ORAL | Status: DC

## 2015-08-06 NOTE — MAU Note (Signed)
Pt C/O yellowish discharge, also irritated, thinks she might have a yeast infection.  Taking meds for thyroid cancer, thinks it may have caused a yeast infection.  Denies bleeding or pain, no dysuria.

## 2015-08-06 NOTE — MAU Note (Signed)
C/o ? Yeast infection for past 2-3 days; had thyroid cancer and is wondering if her medicine is increasing her chance of yeast infections;

## 2015-08-06 NOTE — Discharge Instructions (Signed)

## 2015-08-06 NOTE — MAU Provider Note (Signed)
History     CSN: [id number]  Arrival date and time: 08/06/15 [id number]   First Provider Initiated Contact with Patient 08/06/15 1025      Chief Complaint  Patient presents with  . Vaginal Discharge   HPI   Ms.[name] is a 30 y.o. non-pregnant female G2P1001 presenting to MAU with yeast like symptoms for 2-3 days. Symptoms include vaginal irritation and thick white discharge noted in the toilet after urinating. No vaginal itching.   She has had them in the past, and over the counter medication does not work. She was unsure whether she has a UTI or a yeast infection. She has had some dysuria.    OB History    Gravida Para Term Preterm AB TAB SAB Ectopic Multiple Living   2 1 1      1 1       Past Medical History  Diagnosis Date  . Cancer ([place]) 02/2012    thyroid cancer  . Thyroid disease     cancer  . Anxiety     Past Surgical History  Procedure Laterality Date  . Thyroidectomy  02/2012    total L side first and R side 7 days later.  . Cesarean section  2006  . Dilation and evacuation N/A 02/07/2015    Procedure: DILATATION AND EVACUATION;  Surgeon: Lavonia Drafts, MD;  Location: [facility];  Service: Gynecology;  Laterality: N/A;    Family History  Problem Relation Age of Onset  . Hypertension Mother   . Blindness Mother     one eye    Social History  Substance Use Topics  . Smoking status: Never Smoker   . Smokeless tobacco: Never Used  . Alcohol Use: Yes     Comment: social    Allergies: No Known Allergies  Prescriptions prior to admission  Medication Sig Dispense Refill Last Dose  . ibuprofen (ADVIL,MOTRIN) 600 MG tablet Take 1 tablet (600 mg total) by mouth every 6 (six) hours as needed for moderate pain. 30 tablet 0   . levothyroxine (SYNTHROID, LEVOTHROID) 200 MCG tablet Take 200 mcg by mouth daily before breakfast.    07/01/2015 at Unknown time  . traMADol (ULTRAM) 50 MG tablet Take 1 tablet (50 mg total) by mouth every 6 (six) hours as  needed. 15 tablet 0    Results for orders placed or performed during the hospital encounter of 08/06/15 (from the past 48 hour(s))  Urinalysis, Routine w reflex microscopic (not at [facility])     Status: Abnormal   Collection Time: 08/06/15  9:25 AM  Result Value Ref Range   Color, Urine YELLOW YELLOW   APPearance CLEAR CLEAR   Specific Gravity, Urine >1.030 (H) 1.005 - 1.030   pH 5.5 5.0 - 8.0   Glucose, UA NEGATIVE NEGATIVE mg/dL   Hgb urine dipstick MODERATE (A) NEGATIVE   Bilirubin Urine NEGATIVE NEGATIVE   Ketones, ur NEGATIVE NEGATIVE mg/dL   Protein, ur NEGATIVE NEGATIVE mg/dL   Nitrite NEGATIVE NEGATIVE   Leukocytes, UA SMALL (A) NEGATIVE  Urine microscopic-add on     Status: Abnormal   Collection Time: 08/06/15  9:25 AM  Result Value Ref Range   Squamous Epithelial / LPF 6-30 (A) NONE SEEN   WBC, UA 0-5 0 - 5 WBC/hpf   RBC / HPF 0-5 0 - 5 RBC/hpf   Bacteria, UA MANY (A) NONE SEEN   Urine-Other MUCOUS PRESENT   Pregnancy, urine POC     Status: None   Collection Time: 08/06/15  9:27 AM  Result Value Ref Range   Preg Test, Ur NEGATIVE NEGATIVE    Comment:        THE SENSITIVITY OF THIS METHODOLOGY IS >24 mIU/mL   Wet prep, genital     Status: Abnormal   Collection Time: 08/06/15  9:55 AM  Result Value Ref Range   Yeast Wet Prep HPF POC NONE SEEN NONE SEEN   Trich, Wet Prep NONE SEEN NONE SEEN   Clue Cells Wet Prep HPF POC NONE SEEN NONE SEEN   WBC, Wet Prep HPF POC MODERATE (A) NONE SEEN    Comment: MANY BACTERIA SEEN   Sperm NONE SEEN     Review of Systems  Constitutional: Negative for fever and chills.  Gastrointestinal: Negative for abdominal pain.  Genitourinary: Positive for dysuria, urgency and frequency.   Physical Exam   Blood pressure 139/82, pulse 88, temperature 97.9 F (36.6 C), temperature source Oral, resp. rate 16, last menstrual period 06/13/2015, unknown if currently breastfeeding.  Physical Exam  Constitutional: She is oriented to person,  place, and time. She appears well-developed and well-nourished. No distress.  HENT:  Head: Normocephalic.  Eyes: Pupils are equal, round, and reactive to light.  Genitourinary:  Wet prep and GC collected by RN  Musculoskeletal: Normal range of motion.  Neurological: She is alert and oriented to person, place, and time.  Skin: Skin is warm. She is not diaphoretic.  Psychiatric: Her behavior is normal.    MAU Course  Procedures  None  MDM  Wet prep GC  Assessment and Plan   A:  1. Acute UTI   2. Vaginal irritation     P:  Discharge home in stable condition Follow up in the [facility] for non-emergency issues RX: Bactrim, diflucan  [name], NP 08/06/2015 10:32 AM

## 2015-08-07 LAB — GC/CHLAMYDIA PROBE AMP (~~LOC~~) NOT AT ARMC
Chlamydia: NEGATIVE
Neisseria Gonorrhea: NEGATIVE

## 2015-08-17 ENCOUNTER — Encounter (HOSPITAL_COMMUNITY): Payer: Self-pay | Admitting: *Deleted

## 2015-08-17 ENCOUNTER — Emergency Department (HOSPITAL_COMMUNITY)
Admission: EM | Admit: 2015-08-17 | Discharge: 2015-08-17 | Disposition: A | Discharge status: Home / Self Care | Payer: Medicaid Other | Source: Home / Self Care | Attending: Family Medicine | Admitting: Family Medicine

## 2015-08-17 DIAGNOSIS — S86811A Strain of other muscle(s) and tendon(s) at lower leg level, right leg, initial encounter: Secondary | ICD-10-CM

## 2015-08-17 MED ORDER — IBUPROFEN 800 MG PO TABS: 800.0000 mg | ORAL_TABLET | Freq: Three times a day (TID) | ORAL | Status: DC

## 2015-08-17 NOTE — ED Provider Notes (Signed)
CSN: [id number]     Arrival date & time 08/17/15  1714 History   First MD Initiated Contact with Patient 08/17/15 1817     Chief Complaint  Patient presents with  . Leg Pain   (Consider location/radiation/quality/duration/timing/severity/associated sxs/prior Treatment) Patient is a 30 y.o. female presenting with leg pain. The history is provided by the patient.  Leg Pain Location:  Leg Time since incident:  1 day Injury: yes   Mechanism of injury comment:  Walking at 3.6 mph on treadmill last eve and felt sudden pain in right knee .calf area which continues today. Leg location:  R lower leg Associated symptoms: no back pain     Past Medical History  Diagnosis Date  . Cancer ([place]) 02/2012    thyroid cancer  . Thyroid disease     cancer  . Anxiety    Past Surgical History  Procedure Laterality Date  . Thyroidectomy  02/2012    total L side first and R side 7 days later.  . Cesarean section  2006  . Dilation and evacuation N/A 02/07/2015    Procedure: DILATATION AND EVACUATION;  Surgeon: Lavonia Drafts, MD;  Location: [facility];  Service: Gynecology;  Laterality: N/A;   Family History  Problem Relation Age of Onset  . Hypertension Mother   . Blindness Mother     one eye   Social History  Substance Use Topics  . Smoking status: Never Smoker   . Smokeless tobacco: Never Used  . Alcohol Use: Yes     Comment: social   OB History    Gravida Para Term Preterm AB TAB SAB Ectopic Multiple Living   2 1 1      1 1      Review of Systems  Constitutional: Negative.   Gastrointestinal: Negative.   Musculoskeletal: Positive for myalgias and gait problem. Negative for back pain and joint swelling.  Skin: Negative.   All other systems reviewed and are negative.   Allergies  Review of patient's allergies indicates no known allergies.  Home Medications   Prior to Admission medications   Medication Sig Start Date End Date Taking? Authorizing Provider  fluconazole  (DIFLUCAN) 150 MG tablet Take 1 tablet (150 mg total) by mouth daily. 08/06/15   [name], NP  ibuprofen (ADVIL,MOTRIN) 800 MG tablet Take 1 tablet (800 mg total) by mouth 3 (three) times daily. 08/17/15   Billy Fischer, MD  levothyroxine (SYNTHROID, LEVOTHROID) 200 MCG tablet Take 200 mcg by mouth daily before breakfast.     Historical Provider, MD  traMADol (ULTRAM) 50 MG tablet Take 1 tablet (50 mg total) by mouth every 6 (six) hours as needed. 07/01/15   Melony Overly, MD   Meds Ordered and Administered this Visit  Medications - No data to display  BP 157/94 mmHg  Pulse 101  Temp(Src) 97.7 F (36.5 C) (Oral)  Resp 16  SpO2 100%  LMP 08/05/2014 No data found.   Physical Exam  Constitutional: She is oriented to person, place, and time. She appears well-developed and well-nourished. No distress.  Musculoskeletal: She exhibits tenderness.       Right lower leg: She exhibits tenderness and swelling. She exhibits no bony tenderness, no edema and no deformity.       Legs: Neurological: She is alert and oriented to person, place, and time.  Skin: Skin is warm and dry.  Nursing note and vitals reviewed.   ED Course  Procedures (including critical care time)  Labs Review  Labs Reviewed - No data to display  Imaging Review No results found.   Visual Acuity Review  Right Eye Distance:   Left Eye Distance:   Bilateral Distance:    Right Eye Near:   Left Eye Near:    Bilateral Near:         MDM   1. Strain of calf muscle, right, initial encounter        Billy Fischer, MD 08/17/15 [id number]

## 2015-08-17 NOTE — Discharge Instructions (Signed)
Ice until Sunday then heat twice daily, use medicine as needed, elevate as possible, activity as tolerated.

## 2015-08-17 NOTE — ED Notes (Signed)
Pt  Reports  Pain  r  Leg    While  On tredmill  Last pm    Felt a  Pop   Reports   Pain on  Certain movements  She  Is  Ambulatory   She  Reports  Her trainer told  Her  She  May have pulled a  Muscle  In that  leg

## 2015-08-20 ENCOUNTER — Encounter (HOSPITAL_COMMUNITY)
Admission: RE | Admit: 2015-08-20 | Discharge: 2015-08-20 | Disposition: A | Discharge status: Home / Self Care | Payer: Medicaid Other | Source: Ambulatory Visit | Attending: Endocrinology | Admitting: Endocrinology

## 2015-08-20 DIAGNOSIS — C73 Malignant neoplasm of thyroid gland: Secondary | ICD-10-CM | POA: Diagnosis present

## 2015-08-20 MED ORDER — STERILE WATER FOR INJECTION IJ SOLN
INTRAMUSCULAR | Status: AC
  Filled 2015-08-20: qty 10

## 2015-08-20 MED ORDER — THYROTROPIN ALFA 1.1 MG IM SOLR
0.9000 mg | INTRAMUSCULAR | Status: AC
  Administered 2015-08-20: 0.9 mg via INTRAMUSCULAR

## 2015-08-21 ENCOUNTER — Encounter (HOSPITAL_COMMUNITY)
Admission: RE | Admit: 2015-08-21 | Discharge: 2015-08-21 | Disposition: A | Discharge status: Home / Self Care | Payer: Medicaid Other | Source: Ambulatory Visit | Attending: Endocrinology | Admitting: Endocrinology

## 2015-08-21 DIAGNOSIS — C73 Malignant neoplasm of thyroid gland: Secondary | ICD-10-CM | POA: Diagnosis not present

## 2015-08-21 MED ORDER — STERILE WATER FOR INJECTION IJ SOLN
INTRAMUSCULAR | Status: AC
  Filled 2015-08-21: qty 10

## 2015-08-21 MED ORDER — THYROTROPIN ALFA 1.1 MG IM SOLR
0.9000 mg | INTRAMUSCULAR | Status: AC
  Administered 2015-08-21: 0.9 mg via INTRAMUSCULAR

## 2015-08-22 ENCOUNTER — Encounter (HOSPITAL_COMMUNITY)
Admission: RE | Admit: 2015-08-22 | Discharge: 2015-08-22 | Disposition: A | Discharge status: Home / Self Care | Payer: Medicaid Other | Source: Ambulatory Visit | Attending: Endocrinology | Admitting: Endocrinology

## 2015-08-22 DIAGNOSIS — C73 Malignant neoplasm of thyroid gland: Secondary | ICD-10-CM | POA: Diagnosis not present

## 2015-08-22 LAB — HCG, SERUM, QUALITATIVE: PREG SERUM: NEGATIVE

## 2015-08-24 ENCOUNTER — Encounter (HOSPITAL_COMMUNITY)
Admission: RE | Admit: 2015-08-24 | Discharge: 2015-08-24 | Disposition: A | Discharge status: Home / Self Care | Payer: Medicaid Other | Source: Ambulatory Visit | Attending: Endocrinology | Admitting: Endocrinology

## 2015-08-24 DIAGNOSIS — C73 Malignant neoplasm of thyroid gland: Secondary | ICD-10-CM | POA: Diagnosis not present

## 2015-08-24 MED ORDER — SODIUM IODIDE I 131 CAPSULE
4.0000 | Freq: Once | INTRAVENOUS | Status: AC | PRN
  Administered 2015-08-24: 4 via ORAL

## 2015-11-12 ENCOUNTER — Emergency Department (HOSPITAL_COMMUNITY): Payer: Medicaid Other

## 2015-11-12 ENCOUNTER — Encounter (HOSPITAL_COMMUNITY): Payer: Self-pay | Admitting: Emergency Medicine

## 2015-11-12 ENCOUNTER — Emergency Department (HOSPITAL_COMMUNITY)
Admission: EM | Admit: 2015-11-12 | Discharge: 2015-11-12 | Disposition: A | Discharge status: Home / Self Care | Payer: Medicaid Other | Attending: Emergency Medicine | Admitting: Emergency Medicine

## 2015-11-12 DIAGNOSIS — Z8659 Personal history of other mental and behavioral disorders: Secondary | ICD-10-CM | POA: Insufficient documentation

## 2015-11-12 DIAGNOSIS — Z79899 Other long term (current) drug therapy: Secondary | ICD-10-CM | POA: Insufficient documentation

## 2015-11-12 DIAGNOSIS — R079 Chest pain, unspecified: Secondary | ICD-10-CM | POA: Insufficient documentation

## 2015-11-12 DIAGNOSIS — E039 Hypothyroidism, unspecified: Secondary | ICD-10-CM | POA: Insufficient documentation

## 2015-11-12 DIAGNOSIS — Z3202 Encounter for pregnancy test, result negative: Secondary | ICD-10-CM | POA: Insufficient documentation

## 2015-11-12 DIAGNOSIS — Z85850 Personal history of malignant neoplasm of thyroid: Secondary | ICD-10-CM | POA: Insufficient documentation

## 2015-11-12 DIAGNOSIS — I10 Essential (primary) hypertension: Secondary | ICD-10-CM | POA: Insufficient documentation

## 2015-11-12 LAB — BASIC METABOLIC PANEL
ANION GAP: 7 (ref 5–15)
BUN: 13 mg/dL (ref 6–20)
CHLORIDE: 107 mmol/L (ref 101–111)
CO2: 25 mmol/L (ref 22–32)
Calcium: 8.9 mg/dL (ref 8.9–10.3)
Creatinine, Ser: 0.76 mg/dL (ref 0.44–1.00)
GFR calc non Af Amer: >60 mL/min (ref >60)
Glucose, Bld: 96 mg/dL (ref 65–99)
POTASSIUM: 3.8 mmol/L (ref 3.5–5.1)
SODIUM: 139 mmol/L (ref 135–145)

## 2015-11-12 LAB — TSH: TSH: 39.073 u[IU]/mL — AB (ref 0.350–4.500)

## 2015-11-12 LAB — I-STAT TROPONIN, ED: Troponin i, poc: 0 ng/mL (ref 0.00–0.08)

## 2015-11-12 LAB — CBC
HEMATOCRIT: 38 % (ref 36.0–46.0)
Hemoglobin: 12.7 g/dL (ref 12.0–15.0)
MCH: 28.9 pg (ref 26.0–34.0)
MCHC: 33.4 g/dL (ref 30.0–36.0)
MCV: 86.4 fL (ref 78.0–100.0)
Platelets: 249 10*3/uL (ref 150–400)
RBC: 4.4 MIL/uL (ref 3.87–5.11)
RDW: 12.6 % (ref 11.5–15.5)
WBC: 11.5 10*3/uL — AB (ref 4.0–10.5)

## 2015-11-12 LAB — D-DIMER, QUANTITATIVE: D-Dimer, Quant: <0.27 ug/mL-FEU (ref 0.00–0.50)

## 2015-11-12 LAB — POC URINE PREG, ED: PREG TEST UR: NEGATIVE

## 2015-11-12 NOTE — ED Notes (Signed)
RN contacted lab to verify they have blue and gold for add on orders.  ! Additional gold top needed for T4.  Phlebotomy bedside.

## 2015-11-12 NOTE — ED Notes (Signed)
Pt from home with c/o chest pain that began last night in the left side of her chest that radiates to her back. Pt denies lightheadedness, dizziness, nausea, or vomiting. Pt states the pain is intermittent and is unable to state anything that makes the pain better or worse. Pt states she stopped taking her thyroid medication for "a long time" but started taking it again this morning. Pt has strong radial pulses and adequate cap refill.

## 2015-11-12 NOTE — ED Provider Notes (Signed)
CSN: [id number]     Arrival date & time 11/12/15  1555 History   First MD Initiated Contact with Patient 11/12/15 1921     Chief Complaint  Patient presents with  . Chest Pain     (Consider location/radiation/quality/duration/timing/severity/associated sxs/prior Treatment) HPI Comments: 30 year old female with history of thyroid cancer status post thyroidectomy presents with chest pain. The patient states that earlier yesterday, she began having intermittent left-sided chest pain that radiates to her back. The pain lasts a few seconds and occurs randomly. Nothing makes the pain better or worse. She denies any associated shortness of breath, lightheadedness, nausea, or vomiting. She states that she took aspirin yesterday for the pain. She has been out of her thyroid medication for 2 weeks but started taking it again today. She does note that she noticed leg swelling and pain in her feet this morning. She denies any fevers, cough/cold symptoms, or recent illness. She is not sure whether her mom had heart disease or stroke in her 45s. No other family history of heart disease. No OCP use, recent travel, or personal/family history of blood clots.  Patient is a 30 y.o. female presenting with chest pain. The history is provided by the patient.  Chest Pain   Past Medical History  Diagnosis Date  . Cancer ([place]) 02/2012    thyroid cancer  . Thyroid disease     cancer  . Anxiety    Past Surgical History  Procedure Laterality Date  . Thyroidectomy  02/2012    total L side first and R side 7 days later.  . Cesarean section  2006  . Dilation and evacuation N/A 02/07/2015    Procedure: DILATATION AND EVACUATION;  Surgeon: Lavonia Drafts, MD;  Location: [facility];  Service: Gynecology;  Laterality: N/A;   Family History  Problem Relation Age of Onset  . Hypertension Mother   . Blindness Mother     one eye   Social History  Substance Use Topics  . Smoking status: Never Smoker   . Smokeless  tobacco: Never Used  . Alcohol Use: Yes     Comment: social   OB History    Gravida Para Term Preterm AB TAB SAB Ectopic Multiple Living   2 1 1      1 1      Review of Systems  Cardiovascular: Positive for chest pain.   10 Systems reviewed and are negative for acute change except as noted in the HPI.    Allergies  Review of patient's allergies indicates no known allergies.  Home Medications   Prior to Admission medications   Medication Sig Start Date End Date Taking? Authorizing Provider  levothyroxine (SYNTHROID, LEVOTHROID) 200 MCG tablet Take 200 mcg by mouth daily before breakfast.    Yes Historical Provider, MD   BP 154/102 mmHg  Pulse 105  Temp(Src) 98.7 F (37.1 C) (Oral)  Resp 16  SpO2 100%  LMP 10/24/2015 Physical Exam  Constitutional: She is oriented to person, place, and time. She appears well-developed and well-nourished. No distress.  HENT:  Head: Normocephalic and atraumatic.  Moist mucous membranes  Eyes: Conjunctivae are normal. Pupils are equal, round, and reactive to light.  Neck: Neck supple.  Cardiovascular: Normal rate, regular rhythm and normal heart sounds.   No murmur heard. Pulmonary/Chest: Effort normal and breath sounds normal.  Abdominal: Soft. Bowel sounds are normal. She exhibits no distension. There is no tenderness.  Musculoskeletal: She exhibits edema.  Mild b/l LE non-pitting edema of feet and ankles  Neurological: She is alert and oriented to person, place, and time.  Fluent speech  Skin: Skin is warm and dry.  Psychiatric: She has a normal mood and affect. Judgment normal.  Nursing note and vitals reviewed.   ED Course  Procedures (including critical care time) Labs Review Labs Reviewed  CBC - Abnormal; Notable for the following:    WBC 11.5 (*)    All other components within normal limits  BASIC METABOLIC PANEL  D-DIMER, QUANTITATIVE (NOT AT [facility])  TSH  T4, FREE  I-STAT TROPOININ, ED  POC URINE PREG, ED    Imaging  Review Dg Chest 2 View  11/12/2015  CLINICAL DATA:  Acute chest pain. EXAM: CHEST  2 VIEW COMPARISON:  April 20, 2015. FINDINGS: The heart size and mediastinal contours are within normal limits. Both lungs are clear. No pneumothorax or pleural effusion is noted. The visualized skeletal structures are unremarkable. IMPRESSION: No active cardiopulmonary disease. Electronically Signed   By: Marijo Conception, M.D.   On: 11/12/2015 16:47   I have personally reviewed and evaluated these  lab results as part of my medical decision-making.   EKG Interpretation   Date/Time:  Monday November 12 2015 16:06:34 EDT Ventricular Rate:  101 PR Interval:  150 QRS Duration: 86 QT Interval:  349 QTC Calculation: 452 R Axis:   26 Text Interpretation:  Sinus tachycardia Borderline T abnormalities,  lateral leads tachycardia new from previous, otherwise no significant  change Confirmed by Foy Vanduyne MD, Briarrose Shor [phone]) on 11/12/2015 7:32:25 PM      MDM   Final diagnoses:  None   PT w/ h/o thyroidectomy from thyroid cancer p/w 1 day of Intermittent left-sided chest pain not associated with any other symptoms. She was well-appearing at presentation. Vital signs notable for hypertension at 154/102, initial heart rate 105 but she was not tachycardic during my examination. She denied any pain during my exam. She had mild bilateral lower extremity nonpitting edema involving feet and ankles. No calf tenderness, erythema, or warmth. Obtained above lab work including troponin and d-dimer. Because of patient's tachycardia, I also obtained thyroid studies. EKG shows sinus tachycardia but no other changes from previous EKG. Chest x-ray unremarkable. Troponin and d-dimer are unremarkable and BMP, CBC reassuring. TSH is 39, consistent with hypothyroidism, likely due to patient's history of noncompliance with Synthroid. Pt has already restarted this medication and I emphasized the importance of compliance and PCP follow-up. I have  also discussed with her my concerns for hypertension. Her HEART score is <3 therefore I feel she is safe for discharge . I discussed obtaining 3 hour troponin but patient wanted to go home. I reviewed risks of leaving prior to second troponin. Patient voiced understanding but continued to desire to go home. I instructed to follow-up with PCP and patient voiced understanding of return precautions. Patient discharged in satisfactory condition.   Sharlett Iles, MD 11/12/15 2034

## 2015-11-12 NOTE — Discharge Instructions (Signed)
[employer] 211 is a great source of information about community services available.  Access by dialing 2-1-1 from anywhere in [place], or by website -  CustodianSupply.fi.   Other Local Resources (Updated 08/2015)  [place]    Phone Number and Address  [place] medical care - 1st and 3rd Saturday of every month  Must not qualify for public or private insurance and must have limited income [phone] [address], [place]  Child care  Emergency assistance for housing and [employer]  Medicaid [phone] [address], [place] [address]   [facility] Department  Low-cost medical care for children, communicable diseases, sexually-transmitted diseases, immunizations, maternity care, womens health and family planning [phone] [address], [place] [address]  [facility]   Medication assistance for [facility] residents  Must meet income requirements [phone] [place], [place].    [place]  Child care  Emergency assistance for housing and [employer]  Medicaid [phone] [address] [place], [place] [address]  [facility] and [place]   Low-cost medical care,   Monday through Friday, 9 am to 6 pm.   Accepts Medicare/Medicaid, and self-pay [phone] [address] [place], [place] [address]  [facility] for [place] care - Monday through Friday, 8:30 am - 5:30 pm  Accepts Medicaid and self-pay [phone] [address], [place], [place] [address]   [facility]  Primary medical care, including for those with sickle cell disease  Accepts Medicare,  Medicaid, insurance and self-pay [phone] [place], [place]  [facility]   Primary medical care  Accepts Medicare, [place], insurance and self-pay [phone] [address], [place], [place] [address]   [facility] Department of Social Services  Child care  Emergency assistance for housing and [employer]  Medicaid [phone] [address] [place], [place] [address]  [place] Department of Health and [employer]  Child care  Emergency assistance for housing and [employer]  Medicaid [phone] [place], [place] [address]   [facility] Medication Assistance Program  Medication assistance for [facility] residents with no insurance only  Must have a primary care doctor [phone] [address], [place], [place]  [facility]   Primary medical care  [place], [place], insurance  [phone] [address] [place], [place]  [facility]   Medication assistance [phone]  [facility]   Primary medical care  Accepts Medicare, [place], insurance and self-pay [phone] [address], [place] [address]  [facility]   Primary medical care  Accepts Medicare, [place], insurance and self-pay [phone] [address], [place] [address]  [facility]  For  County residents between the ages of 50 and 65 who do not have any form of health insurance, Medicare, [place], or [place] benefits.  Services are provided free of charge to uninsured patients who fall within federal poverty guidelines.    Hours: Tuesdays and Thursdays, 4:15 - 8 pm [phone] [address] [place],  [address]  [facility]     Primary medical care  Dental  care  Nutritional counseling  Pharmacy  Accepts Medicaid, Medicare,  most insurance.  Fees are adjusted based on ability to pay.   [place], [place] [address], [place], [place] [facility], [place], [place] [facility] [place], [place]  [facility]  Womens health and family planning [phone] [place]. [place], [place] care  Emergency assistance for housing and [employer]  Medicaid [phone] [address] [place], [place] [address]   [facility]    Ages 17 and older  Hours: Mondays and Thursdays, 7:00 am - 9:00 am Patients are seen on a first come, first served basis. [phone], ext. [place], [place]  Child care  Emergency assistance for housing and [employer]  Medicaid [phone] [address], [place] [address]  [facility] [place]  Medication assistance  Rental assistance  Food pantry  Medication assistance  Housing assistance  Emergency food distribution  Utility assistance [place], [place]  [address]. [place], [place] [address] Hours: Tuesdays and Thursdays from 9am - 12 noon by appointment only  [place], [place] [address]  [facility] [place] private insurance, [place], and [place].  Payment is based on a sliding scale for those without insurance.  Hours: Mondays, Tuesdays and Thursdays, 8:30 am - 5:30 pm.   [phone] [place], [place]  [facility] - Family Medicine at  [facility], [place], and [place].  Payment is based on a sliding scale for those without insurance. [phone] [address], [place]  [facility] - Pediatrics at [address], research (medical), [employer], and [place].  Payment is based on a sliding scale for those without insurance [phone] [address], [employer], [place]  [facility] - Pediatrics at [employer], [place], and [place].  Payment is based on a sliding scale for those without insurance. [phone] [place], [place]  [facility] - Pediatrics at [facility], [place], and [place].  Payment is based on a sliding scale for those without insurance. [phone], ext. [phone] [place], [place].    [place] care.  Accepts Medicaid and self-pay. [place], [place]

## 2015-11-12 NOTE — ED Notes (Signed)
[employer] top tubes are in lab

## 2015-11-13 LAB — T4, FREE: FREE T4: 0.51 ng/dL — AB (ref 0.61–1.12)

## 2015-11-23 ENCOUNTER — Encounter (HOSPITAL_COMMUNITY): Payer: Self-pay | Admitting: Emergency Medicine

## 2015-11-23 ENCOUNTER — Emergency Department (HOSPITAL_COMMUNITY): Payer: Medicaid Other

## 2015-11-23 ENCOUNTER — Emergency Department (HOSPITAL_COMMUNITY)
Admission: EM | Admit: 2015-11-23 | Discharge: 2015-11-24 | Disposition: A | Discharge status: Home / Self Care | Payer: Medicaid Other | Attending: Emergency Medicine | Admitting: Emergency Medicine

## 2015-11-23 DIAGNOSIS — Z3202 Encounter for pregnancy test, result negative: Secondary | ICD-10-CM | POA: Insufficient documentation

## 2015-11-23 DIAGNOSIS — E669 Obesity, unspecified: Secondary | ICD-10-CM | POA: Insufficient documentation

## 2015-11-23 DIAGNOSIS — R0602 Shortness of breath: Secondary | ICD-10-CM | POA: Insufficient documentation

## 2015-11-23 DIAGNOSIS — R600 Localized edema: Secondary | ICD-10-CM

## 2015-11-23 DIAGNOSIS — Z85850 Personal history of malignant neoplasm of thyroid: Secondary | ICD-10-CM | POA: Insufficient documentation

## 2015-11-23 DIAGNOSIS — Z8659 Personal history of other mental and behavioral disorders: Secondary | ICD-10-CM | POA: Insufficient documentation

## 2015-11-23 DIAGNOSIS — E039 Hypothyroidism, unspecified: Secondary | ICD-10-CM | POA: Insufficient documentation

## 2015-11-23 DIAGNOSIS — Z79899 Other long term (current) drug therapy: Secondary | ICD-10-CM | POA: Insufficient documentation

## 2015-11-23 LAB — CBC WITH DIFFERENTIAL/PLATELET
Basophils Absolute: 0 10*3/uL (ref 0.0–0.1)
Basophils Relative: 0 %
Eosinophils Absolute: 0.2 10*3/uL (ref 0.0–0.7)
Eosinophils Relative: 1 %
HCT: 35.8 % — ABNORMAL LOW (ref 36.0–46.0)
Hemoglobin: 12.2 g/dL (ref 12.0–15.0)
Lymphocytes Relative: 29 %
Lymphs Abs: 3.5 10*3/uL (ref 0.7–4.0)
MCH: 29.5 pg (ref 26.0–34.0)
MCHC: 34.1 g/dL (ref 30.0–36.0)
MCV: 86.5 fL (ref 78.0–100.0)
Monocytes Absolute: 1 10*3/uL (ref 0.1–1.0)
Monocytes Relative: 8 %
Neutro Abs: 7.3 10*3/uL (ref 1.7–7.7)
Neutrophils Relative %: 62 %
Platelets: 283 10*3/uL (ref 150–400)
RBC: 4.14 MIL/uL (ref 3.87–5.11)
RDW: 13.2 % (ref 11.5–15.5)
WBC: 11.9 10*3/uL — ABNORMAL HIGH (ref 4.0–10.5)

## 2015-11-23 LAB — BASIC METABOLIC PANEL
Anion gap: 7 (ref 5–15)
BUN: 13 mg/dL (ref 6–20)
CO2: 25 mmol/L (ref 22–32)
Calcium: 8.9 mg/dL (ref 8.9–10.3)
Chloride: 108 mmol/L (ref 101–111)
Creatinine, Ser: 0.72 mg/dL (ref 0.44–1.00)
GFR calc Af Amer: >60 mL/min (ref >60)
GFR calc non Af Amer: >60 mL/min (ref >60)
Glucose, Bld: 114 mg/dL — ABNORMAL HIGH (ref 65–99)
Potassium: 4.1 mmol/L (ref 3.5–5.1)
Sodium: 140 mmol/L (ref 135–145)

## 2015-11-23 LAB — URINALYSIS, ROUTINE W REFLEX MICROSCOPIC
Bilirubin Urine: NEGATIVE
Glucose, UA: NEGATIVE mg/dL
Ketones, ur: NEGATIVE mg/dL
Leukocytes, UA: NEGATIVE
Nitrite: NEGATIVE
Protein, ur: NEGATIVE mg/dL
Specific Gravity, Urine: 1.028 (ref 1.005–1.030)
pH: 5 (ref 5.0–8.0)

## 2015-11-23 LAB — URINE MICROSCOPIC-ADD ON
Squamous Epithelial / LPF: NONE SEEN
WBC, UA: NONE SEEN WBC/hpf (ref 0–5)

## 2015-11-23 LAB — PREGNANCY, URINE: Preg Test, Ur: NEGATIVE

## 2015-11-23 LAB — BRAIN NATRIURETIC PEPTIDE: B Natriuretic Peptide: 11.5 pg/mL (ref 0.0–100.0)

## 2015-11-23 MED ORDER — IOPAMIDOL (ISOVUE-370) INJECTION 76%
100.0000 mL | Freq: Once | INTRAVENOUS | Status: AC | PRN
  Administered 2015-11-23: 100 mL via INTRAVENOUS

## 2015-11-23 NOTE — ED Provider Notes (Signed)
CSN: [id number]     Arrival date & time 11/23/15  1830 History   First MD Initiated Contact with Patient 11/23/15 1927     Chief Complaint  Patient presents with  . Leg Swelling    HPI The patient is a 30 year old obese black female with PMH of thyroidectomy who presents with 3 days of bilateral lower extremity swelling and shortness of breath. She states the swelling started three days ago and has been persistent with no relief. She endorses throbbing pain in legs with walking and with rest. Patient has been elevating feet with no notable differences. She denies numbness or tingling, cold extremities or difficulty ambulating. She also endorses SOB with small daily activities such as dressing or walking to car. Denies increased work of breathing or pleuritic chest pain. Denies smoking or birth control use, denies hx of HTN or diabetes. Denies orthopnea or hypoventilation when shoe tying.  Past Medical History  Diagnosis Date  . Cancer ([place]) 02/2012    thyroid cancer  . Thyroid disease     cancer  . Anxiety    Past Surgical History  Procedure Laterality Date  . Thyroidectomy  02/2012    total L side first and R side 7 days later.  . Cesarean section  2006  . Dilation and evacuation N/A 02/07/2015    Procedure: DILATATION AND EVACUATION;  Surgeon: Lavonia Drafts, MD;  Location: [facility];  Service: Gynecology;  Laterality: N/A;   Family History  Problem Relation Age of Onset  . Hypertension Mother   . Blindness Mother     one eye   Social History  Substance Use Topics  . Smoking status: Never Smoker   . Smokeless tobacco: Never Used  . Alcohol Use: Yes     Comment: social   OB History    Gravida Para Term Preterm AB TAB SAB Ectopic Multiple Living   2 1 1      1 1      Review of Systems All pertinent ROS as above in HPI   Allergies  Review of patient's allergies indicates no known allergies.  Home Medications   Prior to Admission medications   Medication Sig Start  Date End Date Taking? Authorizing Provider  levothyroxine (SYNTHROID, LEVOTHROID) 200 MCG tablet Take 200 mcg by mouth daily before breakfast.     Historical Provider, MD   BP 146/93 mmHg  Pulse 111  Temp(Src) 98 F (36.7 C) (Oral)  Resp 18  Ht 5\' 2"  (1.575 m)  Wt 127.007 kg  BMI 51.20 kg/m2  SpO2 99%  LMP 10/24/2015 Physical Exam  Constitutional: She is oriented to person, place, and time. She appears well-developed and well-nourished. No distress.  Obese female  HENT:  Head: Normocephalic and atraumatic.  Mouth/Throat: Oropharynx is clear and moist.  Eyes: Pupils are equal, round, and reactive to light.  Neck: Normal range of motion. Neck supple.  Cardiovascular: Normal rate, regular rhythm, normal heart sounds and intact distal pulses.  Exam reveals no gallop and no friction rub.   No murmur heard. No warmth, erythema or palpable venous cords of lower extremities bilaterally.  Edema noted on lower extremities, non pitting. Negative homans sign bilaterally  Pulmonary/Chest: Effort normal and breath sounds normal. No respiratory distress. She has no wheezes. She has no rales.  Abdominal: Soft. Bowel sounds are normal. She exhibits no distension. There is no tenderness.  Musculoskeletal: She exhibits edema.  Patient has 2+ pitting edema on examination  Neurological: She is alert and  oriented to person, place, and time. She exhibits normal muscle tone. Coordination normal.  Skin: Skin is warm and dry.  Brown stretch marks noted on distal lower extremities bilaterally. Skin thickening and tightness noted around ankles.  Psychiatric: She has a normal mood and affect. Her behavior is normal.  Nursing note and vitals reviewed.   ED Course  Procedures (including critical care time) Labs Review Labs Reviewed  BASIC METABOLIC PANEL - Abnormal; Notable for the following:    Glucose, Bld 114 (*)    All other components within normal limits  CBC WITH DIFFERENTIAL/PLATELET - Abnormal;  Notable for the following:    WBC 11.9 (*)    HCT 35.8 (*)    All other components within normal limits  URINALYSIS, ROUTINE W REFLEX MICROSCOPIC (NOT AT [facility]) - Abnormal; Notable for the following:    Hgb urine dipstick TRACE (*)    All other components within normal limits  URINE MICROSCOPIC-ADD ON - Abnormal; Notable for the following:    Bacteria, UA RARE (*)    All other components within normal limits  BRAIN NATRIURETIC PEPTIDE  PREGNANCY, URINE    Imaging Review Ct Angio Chest Pe W/cm &/or Wo Cm  11/23/2015  CLINICAL DATA:  Bilateral lower extremity swelling for 3 days. Shortness of breath and chest pain. EXAM: CT ANGIOGRAPHY CHEST WITH CONTRAST TECHNIQUE: Multidetector CT imaging of the chest was performed using the standard protocol during bolus administration of intravenous contrast. Multiplanar CT image reconstructions and MIPs were obtained to evaluate the vascular anatomy. CONTRAST:  100 mL Isovue 370 COMPARISON:  None. FINDINGS: Technically adequate study with moderately good opacification of the central and proximal segmental pulmonary arteries. No focal filling defects are demonstrated. No evidence of significant pulmonary embolus. Normal heart size. Normal caliber thoracic aorta. No aortic dissection. Great vessel origins are patent. Surgical resection of the thyroid gland. Esophagus is decompressed. No significant lymphadenopathy in the chest. Lungs are clear. No focal airspace disease or consolidation. No pleural effusions. No pneumothorax. Airways are patent. Included portions of the upper abdominal organs are unremarkable. No destructive bone lesions. Review of the MIP images confirms the above findings. IMPRESSION: No evidence of significant pulmonary embolus. No evidence of active pulmonary disease. Electronically Signed   By: Lucienne Capers M.D.   On: 11/23/2015 23:24   I have personally reviewed and evaluated these images and lab results as part of my medical  decision-making.   MDM   The patient presents with lower extremity swelling and SOB, need to r/o PE. Patient has minimal risk factors, obesity with no smoking hx, use of birth control or additional hypercoagulable states, no hx of HTN, CAD or hyperlipidemia. CTA ordered to r/o PE at this time. Overweight with leg swelling, BNP ordered to r/o CHF. Good peripheral perfusion, no cold extremities or motor deficits, unlikely arterial disease, probable venous insufficiency due to brawny skin changes and non-pitting edema, will educate patient on importance of elevation of feet, regular exercise, and use of compression stockings.    Dalia Heading, PA-C 11/24/15 0018  Leo Grosser, MD 11/25/15 [phone]

## 2015-11-23 NOTE — ED Notes (Signed)
First attempt-PT did not answer to name being called

## 2015-11-23 NOTE — ED Notes (Signed)
Pt c/o bilat lower extremity swelling x 3 days with shob.

## 2015-11-23 NOTE — ED Notes (Signed)
Second attempt- PT did not answer

## 2015-11-23 NOTE — Discharge Instructions (Signed)
Elevate your lower extremities above the level of your heart.  Watch your salt intake.  Return here as needed

## 2016-05-30 IMAGING — US US OB TRANSVAGINAL
1 series · 14 of 26 positions shown · non-contrast
Comparison: 01/28/2015

CLINICAL DATA: Follow-up viability. No fetal heart tones seen on
prior ultrasound

EXAM:
TRANSVAGINAL OB ULTRASOUND
TECHNIQUE: Transvaginal ultrasound was performed for complete evaluation of the
gestation as well as the maternal uterus, adnexal regions, and
pelvic cul-de-sac.

[Series 1: us ob transvaginal · 26 acquisitions, 14 frames shown]
[im 1/26]
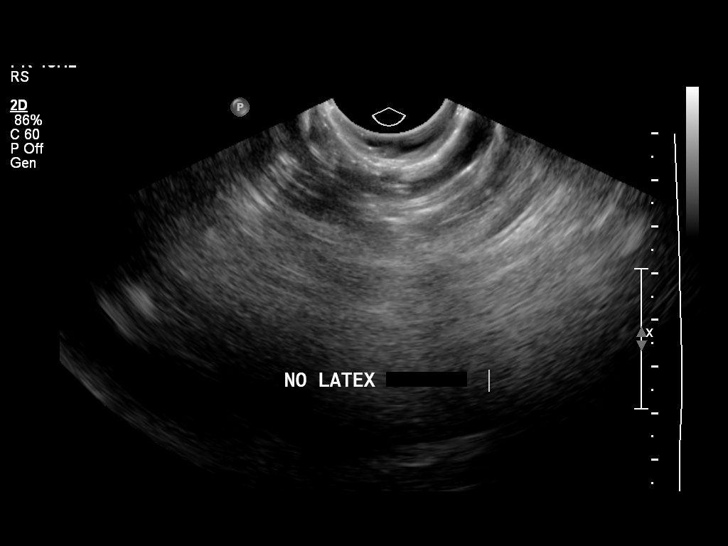
[im 3/26]
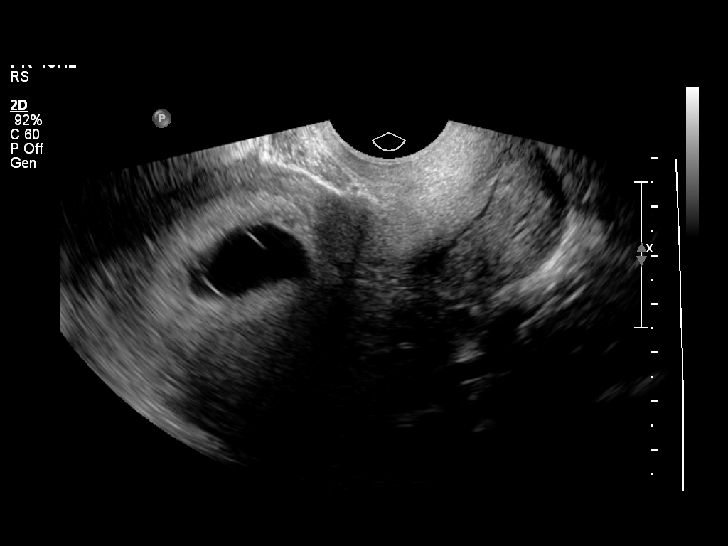
[im 5/26]
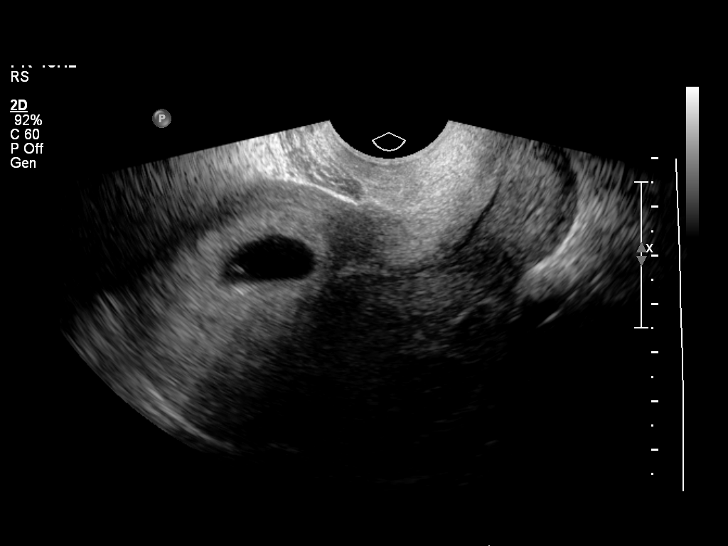
[im 7/26]
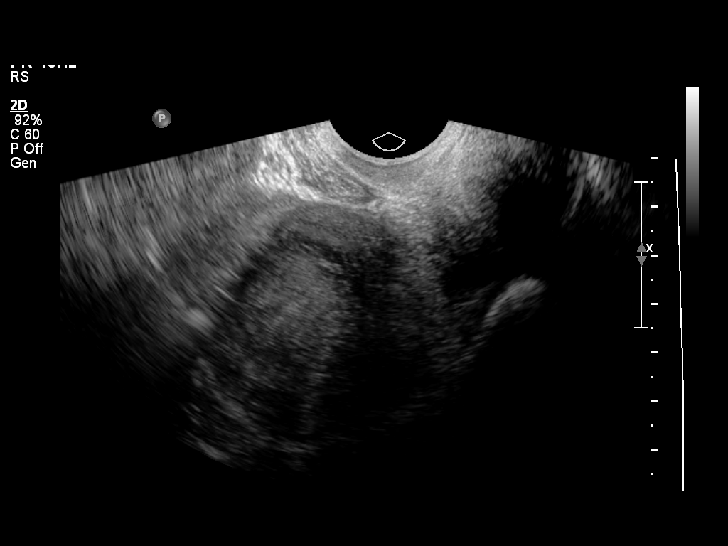
[im 9/26]
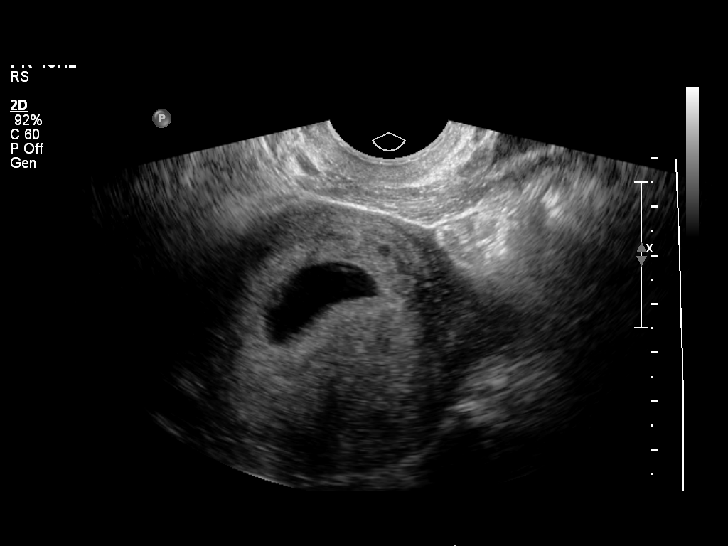
[im 11/26]
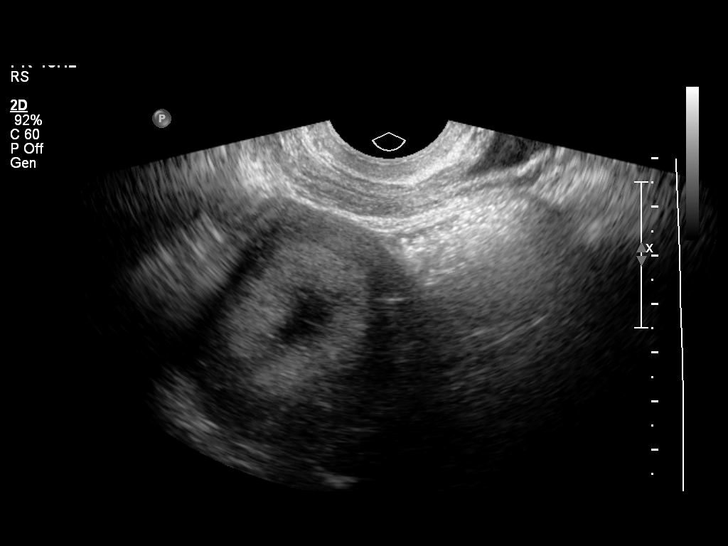
[im 13/26]
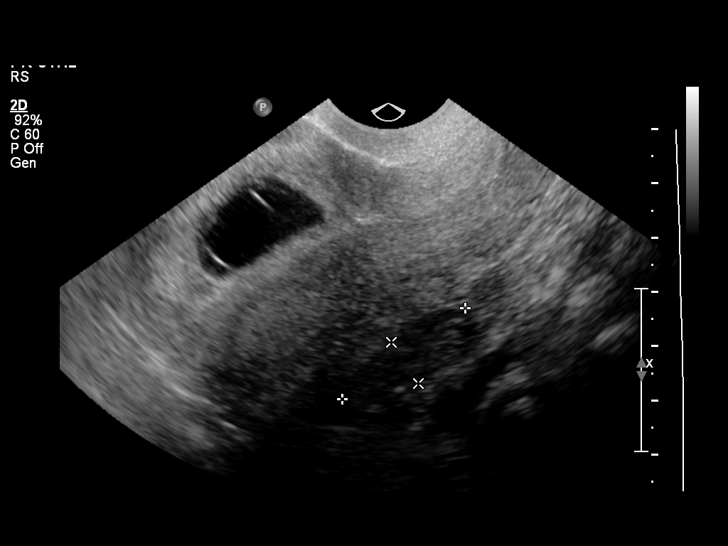
[im 14/26]
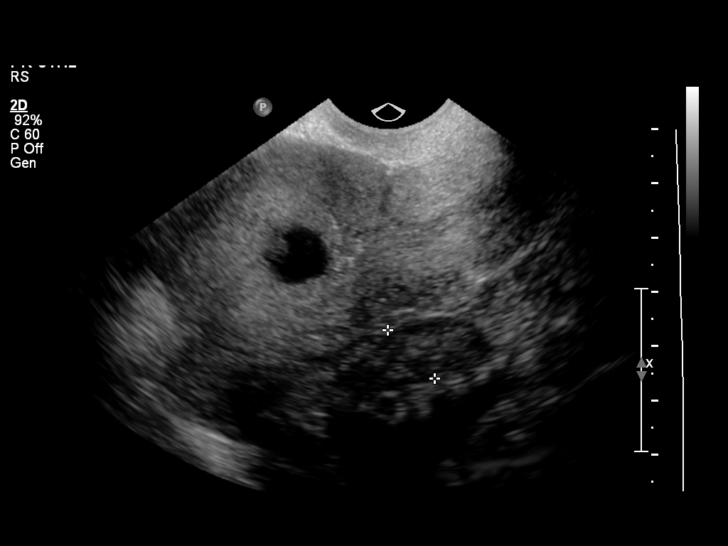
[im 16/26]
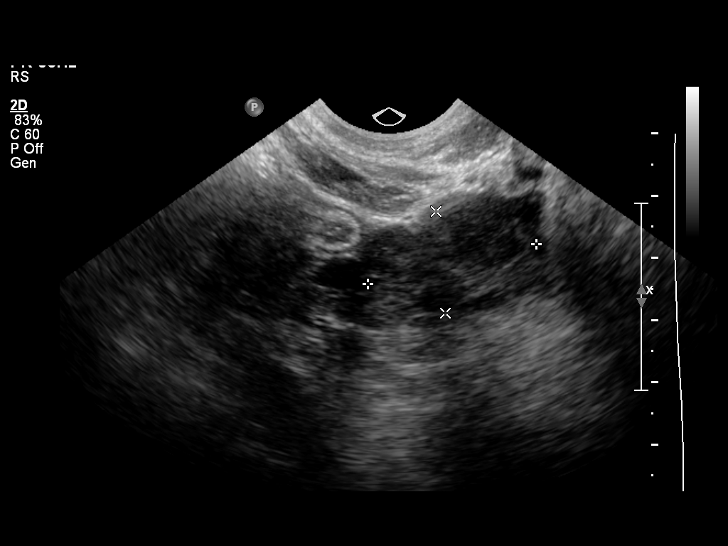
[im 18/26]
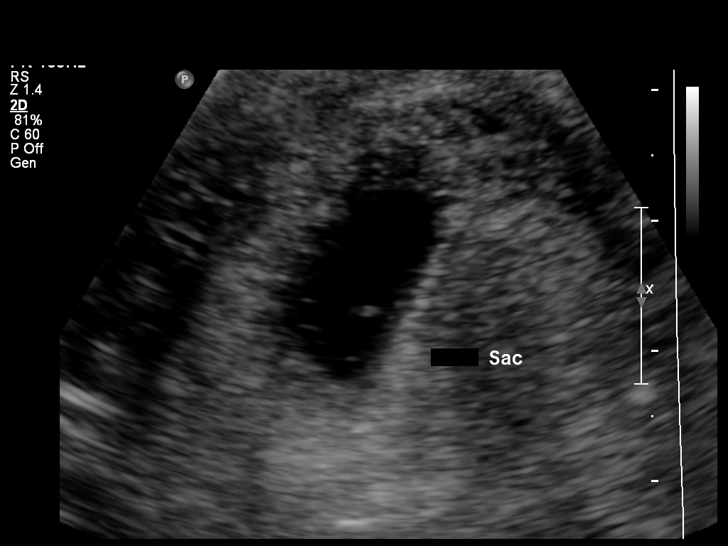
[im 20/26]
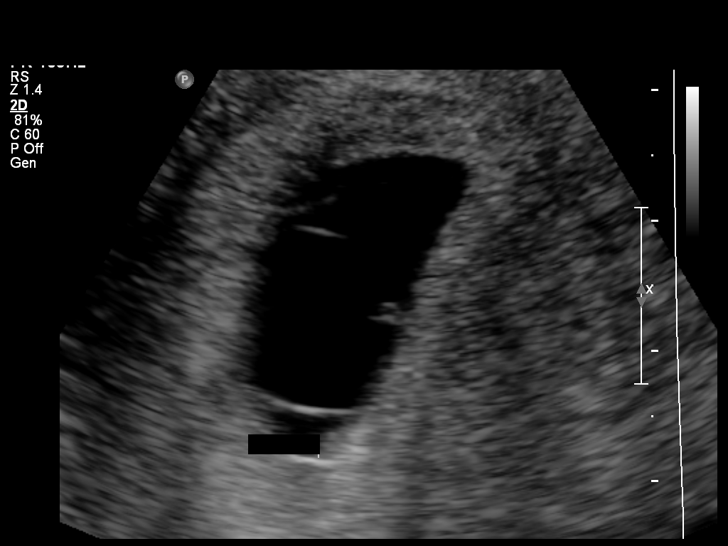
[im 22/26]
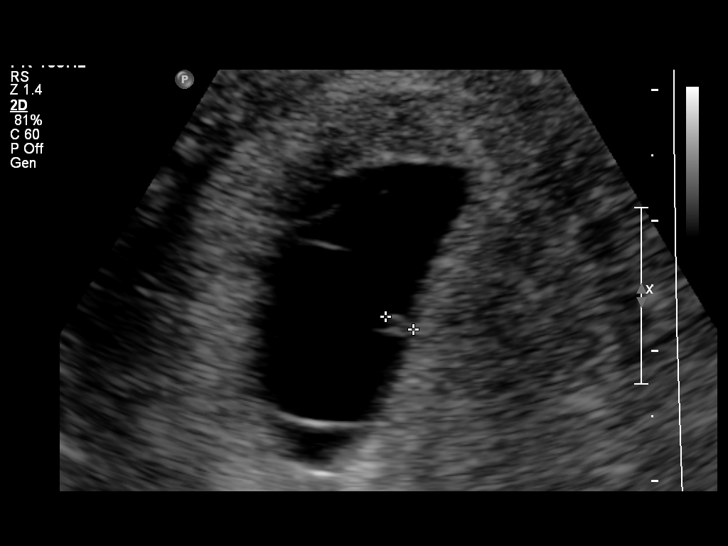
[im 24/26]
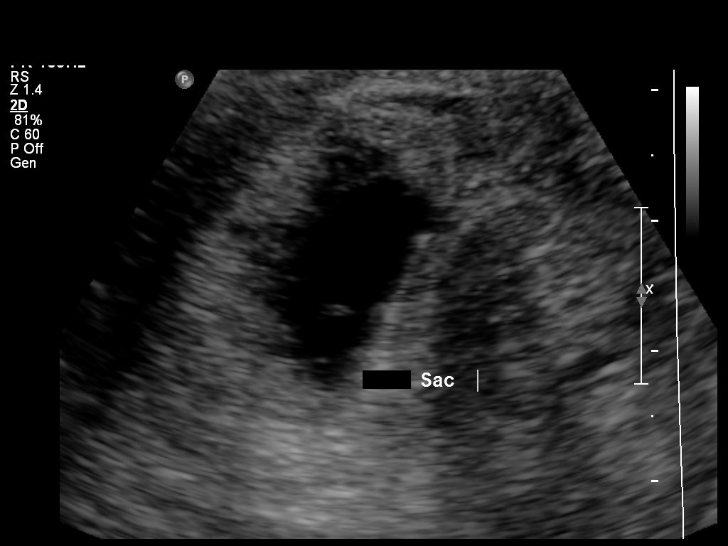
[im 26/26]
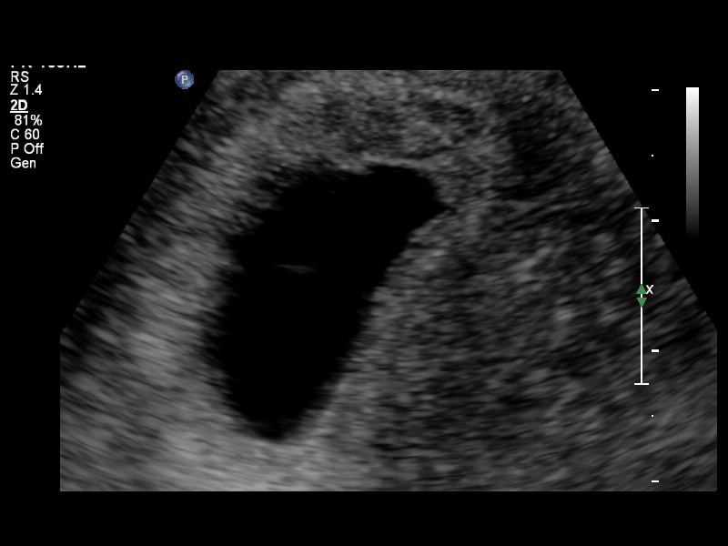

[14 of 26 positions shown; findings below may reference images not displayed]

FINDINGS: Intrauterine gestational sac: Visualized

Yolk sac:  Visualized

Embryo:  Visualized

Cardiac Activity: Not visualized

Heart Rate:  bpm

MSD:   mm    w     d

CRL:   2.8  mm   6 w 0 d                  US EDC: 10/02/2015

Maternal uterus/adnexae: No subchorionic hemorrhage or adnexal mass.
No free fluid.
IMPRESSION: Crown-rump length has decreased in size. No interval growth. No
fetal heart tones. Findings highly suspicious of failed pregnancy.

## 2016-07-22 ENCOUNTER — Emergency Department (HOSPITAL_COMMUNITY): Payer: Self-pay

## 2016-07-22 ENCOUNTER — Encounter (HOSPITAL_COMMUNITY): Payer: Self-pay

## 2016-07-22 ENCOUNTER — Emergency Department (HOSPITAL_COMMUNITY)
Admission: EM | Admit: 2016-07-22 | Discharge: 2016-07-22 | Disposition: A | Discharge status: Home / Self Care | Payer: Self-pay | Attending: Emergency Medicine | Admitting: Emergency Medicine

## 2016-07-22 DIAGNOSIS — J069 Acute upper respiratory infection, unspecified: Secondary | ICD-10-CM | POA: Insufficient documentation

## 2016-07-22 DIAGNOSIS — Z85850 Personal history of malignant neoplasm of thyroid: Secondary | ICD-10-CM | POA: Insufficient documentation

## 2016-07-22 MED ORDER — ACETAMINOPHEN 500 MG PO TABS
1000.0000 mg | ORAL_TABLET | Freq: Once | ORAL | Status: AC
  Administered 2016-07-22: 1000 mg via ORAL
  Filled 2016-07-22: qty 2

## 2016-07-22 MED ORDER — OXYMETAZOLINE HCL 0.05 % NA SOLN: 1.0000 | Freq: Two times a day (BID) | NASAL | 0 refills | Status: DC

## 2016-07-22 MED ORDER — BENZONATATE 100 MG PO CAPS: 200.0000 mg | ORAL_CAPSULE | Freq: Two times a day (BID) | ORAL | 0 refills | Status: DC | PRN

## 2016-07-22 MED ORDER — ALBUTEROL SULFATE HFA 108 (90 BASE) MCG/ACT IN AERS
2.0000 | INHALATION_SPRAY | RESPIRATORY_TRACT | Status: DC | PRN
  Administered 2016-07-22: 2 via RESPIRATORY_TRACT
  Filled 2016-07-22: qty 6.7

## 2016-07-22 NOTE — Discharge Instructions (Signed)
Take your medications as prescribed to help with her cough and nasal congestion. You may also use her albuterol inhaler as prescribed as needed for wheezing/shortness of breath. Continue drinking fluids at home to remain hydrated. Please follow up with a primary care provider from the Resource Guide provided below in 4-5 days if your symptoms have not improved. Please return to the Emergency Department if symptoms worsen or new onset of fever, headache, neck stiffness, visual changes, chest pain, difficulty breathing, coughing up blood, vomiting, abdominal pain, unable to keep fluids down.

## 2016-07-22 NOTE — ED Provider Notes (Signed)
[facility] DEPT Provider Note   CSN: [id number] Arrival date & time: 07/22/16  1147     History   Chief Complaint Chief Complaint  Patient presents with  . Shortness of Breath  . Headache  . Cough    HPI [name] is a 30 y.o. female.  HPI   Patient is a 30 year old female with history of thyroid cancer status post thyroidectomy presents the ED with complaint of cough, onset 3-4 days. Patient reports she has had a worsening productive cough with brown sputum for the past few days. She reports last night when she was having a cough attack she began to feel short of breath. Patient reports having intermittent shortness of breath last night and this morning. Patient reports history of asthma but notes she has been out of her inhaler for a while. Denies any pain or alleviating factors. 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 Endorses associated rhinorrhea. Denies fever, chills, lightheadedness, dizziness, neck stiffness, photophobia, visual changes, ear pain, sore throat, hemoptysis, chest pain, abdominal pain, nausea, vomiting. 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 Denies any recent hospitalizations, surgeries, history of DVT/PE, leg swelling, hemoptysis, hormone use. Patient denies taking any medications at home for her symptoms.  Past Medical History:  Diagnosis Date  . Anxiety   . Cancer ([place]) 02/2012   thyroid cancer  . Thyroid disease    cancer    Patient Active Problem List   Diagnosis Date Noted  . Abortion, missed 02/07/2015    Past Surgical History:  Procedure Laterality Date  . CESAREAN SECTION  2006  . DILATION AND EVACUATION N/A 02/07/2015   Procedure: DILATATION AND EVACUATION;  Surgeon: Lavonia Drafts, MD;  Location: [facility];  Service:  Gynecology;  Laterality: N/A;  . THYROIDECTOMY  02/2012   total L side first and R side 7 days later.    OB History    Gravida Para Term Preterm AB Living   2 1 1     1    SAB TAB Ectopic Multiple Live Births         1         Home Medications    Prior to Admission medications   Medication Sig Start Date End Date Taking? Authorizing Provider  albuterol (PROVENTIL HFA;VENTOLIN HFA) 108 (90 Base) MCG/ACT inhaler Inhale 1-2 puffs into the lungs every 6 (six) hours as needed for wheezing or shortness of breath.   Yes Historical Provider, MD  levothyroxine (SYNTHROID, LEVOTHROID) 200 MCG tablet Take 200 mcg by mouth daily before breakfast.    Yes Historical Provider, MD  benzonatate (TESSALON) 100 MG capsule Take 2 capsules (200 mg total) by mouth 2 (two) times daily as needed for cough. 07/22/16   Nona Dell, PA-C  oxymetazoline (AFRIN NASAL SPRAY) 0.05 % nasal spray Place 1 spray into both nostrils 2 (two) times daily. Spray once into each nostril twice daily for up to the next 3 days. Do not use for more than 3 days to prevent rebound rhinorrhea. 07/22/16   Nona Dell, PA-C    Family History Family History  Problem Relation Age of Onset  . Hypertension Mother   . Blindness Mother     one eye    Social History Social History  Substance Use  Topics  . Smoking status: Never Smoker  . Smokeless tobacco: Never Used  . Alcohol use Yes     Comment: social     Allergies   Patient has no known allergies.   Review of Systems Review of Systems  HENT: Positive for rhinorrhea.   Respiratory: Positive for cough and shortness of breath.   Neurological: Positive for headaches.  All other systems reviewed and are negative.    Physical Exam Updated Vital Signs BP (!) 155/101 (BP Location: Left Arm)   Pulse 97   Temp 98.9 F (37.2 C) (Oral)   Resp 20   Ht 5\' 2"  (1.575 m)   Wt 127 kg   LMP 07/08/2016   SpO2 98%   BMI 51.21 kg/m   Physical Exam    Constitutional: She is oriented to person, place, and time. She appears well-developed and well-nourished. No distress.  HENT:  Head: Normocephalic and atraumatic.  Right Ear: Tympanic membrane normal.  Left Ear: Tympanic membrane normal.  Nose: Rhinorrhea present. Right sinus exhibits no maxillary sinus tenderness and no frontal sinus tenderness. Left sinus exhibits no maxillary sinus tenderness and no frontal sinus tenderness.  Mouth/Throat: Uvula is midline, oropharynx is clear and moist and mucous membranes are normal. No oropharyngeal exudate, posterior oropharyngeal edema, posterior oropharyngeal erythema or tonsillar abscesses. No tonsillar exudate.  Eyes: Conjunctivae and EOM are normal. Pupils are equal, round, and reactive to light. Right eye exhibits no discharge. Left eye exhibits no discharge. No scleral icterus.  Neck: Normal range of motion. Neck supple.  Cardiovascular: Normal rate, regular rhythm, normal heart sounds and intact distal pulses.   Pulmonary/Chest: Effort normal and breath sounds normal. No respiratory distress. She has no wheezes. She has no rales. She exhibits no tenderness.  Abdominal: Soft. Bowel sounds are normal. She exhibits no distension and no mass. There is no tenderness. There is no rebound and no guarding.  Musculoskeletal: Normal range of motion. She exhibits no edema.  Lymphadenopathy:    She has no cervical adenopathy.  Neurological: She is alert and oriented to person, place, and time. She has normal strength. No cranial nerve deficit or sensory deficit. Coordination normal.  Skin: Skin is warm and dry. She is not diaphoretic.  Nursing note and vitals reviewed.    ED Treatments / Results  Labs (all labs ordered are listed, but only abnormal results are displayed) Labs Reviewed - No data to display  EKG  EKG Interpretation None       Radiology Dg Chest 2 View  Result Date: 07/22/2016 CLINICAL DATA:  Nonproductive cough, shortness of  breath, headaches. History of thyroid malignancy EXAM: CHEST  2 VIEW COMPARISON:  Chest x-ray of November 12, 2015 FINDINGS: The lungs are adequately inflated. There is no focal infiltrate. There is no pleural effusion. The heart and pulmonary vascularity are normal. The mediastinum is normal in width. The bony thorax exhibits no acute abnormality. IMPRESSION: There is no active cardiopulmonary disease. Electronically Signed   By: David  Martinique M.D.   On: 07/22/2016 12:49    Procedures Procedures (including critical care time)  Medications Ordered in ED Medications  albuterol (PROVENTIL HFA;VENTOLIN HFA) 108 (90 Base) MCG/ACT inhaler 2 puff (not administered)  acetaminophen (TYLENOL) tablet 1,000 mg (1,000 mg Oral Given 07/22/16 1235)     Initial Impression / Assessment and Plan / ED Course  I have reviewed the triage vital signs and the nursing notes.  Pertinent labs & imaging results that were available during my care of  the patient were reviewed by me and considered in my medical decision making (see chart for details).  Clinical Course     Pt CXR negative for acute infiltrate. Patients symptoms are consistent with URI, likely viral etiology. Pt reports headache resolved after dose of tylenol. VSS. Discussed that antibiotics are not indicated for viral infections. Pt will be discharged with symptomatic treatment.  Verbalizes understanding and is agreeable with plan. Pt is hemodynamically stable & in NAD prior to dc. Discussed return precautions.  Final Clinical Impressions(s) / ED Diagnoses   Final diagnoses:  Viral upper respiratory tract infection    New Prescriptions New Prescriptions   BENZONATATE (TESSALON) 100 MG CAPSULE    Take 2 capsules (200 mg total) by mouth 2 (two) times daily as needed for cough.   OXYMETAZOLINE (AFRIN NASAL SPRAY) 0.05 % NASAL SPRAY    Place 1 spray into both nostrils 2 (two) times daily. Spray once into each nostril twice daily for up to the next 3  days. Do not use for more than 3 days to prevent rebound rhinorrhea.     Chesley Noon [place], [place] 07/22/16 1341    Veryl Speak, MD 07/22/16 [phone]

## 2016-07-22 NOTE — ED Triage Notes (Signed)
Patient c/o productive cough with brown sputum x 3-4 days and SOB and headache began last night.

## 2016-07-31 ENCOUNTER — Encounter (HOSPITAL_COMMUNITY): Payer: Self-pay | Admitting: Emergency Medicine

## 2016-07-31 ENCOUNTER — Emergency Department (HOSPITAL_COMMUNITY)
Admission: EM | Admit: 2016-07-31 | Discharge: 2016-07-31 | Disposition: A | Discharge status: Home / Self Care | Payer: Medicaid Other | Attending: Emergency Medicine | Admitting: Emergency Medicine

## 2016-07-31 DIAGNOSIS — Z85850 Personal history of malignant neoplasm of thyroid: Secondary | ICD-10-CM | POA: Insufficient documentation

## 2016-07-31 DIAGNOSIS — R1013 Epigastric pain: Secondary | ICD-10-CM | POA: Insufficient documentation

## 2016-07-31 LAB — COMPREHENSIVE METABOLIC PANEL
ALBUMIN: 4.5 g/dL (ref 3.5–5.0)
ALT: 17 U/L (ref 14–54)
AST: 22 U/L (ref 15–41)
Alkaline Phosphatase: 69 U/L (ref 38–126)
Anion gap: 10 (ref 5–15)
BILIRUBIN TOTAL: 0.3 mg/dL (ref 0.3–1.2)
BUN: 12 mg/dL (ref 6–20)
CO2: 25 mmol/L (ref 22–32)
Calcium: 8.8 mg/dL — ABNORMAL LOW (ref 8.9–10.3)
Chloride: 104 mmol/L (ref 101–111)
Creatinine, Ser: 0.76 mg/dL (ref 0.44–1.00)
GFR calc Af Amer: >60 mL/min (ref >60)
GFR calc non Af Amer: >60 mL/min (ref >60)
GLUCOSE: 113 mg/dL — AB (ref 65–99)
POTASSIUM: 3.7 mmol/L (ref 3.5–5.1)
Sodium: 139 mmol/L (ref 135–145)
TOTAL PROTEIN: 7.5 g/dL (ref 6.5–8.1)

## 2016-07-31 LAB — URINALYSIS, ROUTINE W REFLEX MICROSCOPIC
BACTERIA UA: NONE SEEN
Bilirubin Urine: NEGATIVE
Glucose, UA: NEGATIVE mg/dL
Ketones, ur: NEGATIVE mg/dL
Leukocytes, UA: NEGATIVE
Nitrite: NEGATIVE
Protein, ur: NEGATIVE mg/dL
SPECIFIC GRAVITY, URINE: 1.026 (ref 1.005–1.030)
pH: 6 (ref 5.0–8.0)

## 2016-07-31 LAB — CBC
HEMATOCRIT: 37.4 % (ref 36.0–46.0)
Hemoglobin: 12.6 g/dL (ref 12.0–15.0)
MCH: 29.4 pg (ref 26.0–34.0)
MCHC: 33.7 g/dL (ref 30.0–36.0)
MCV: 87.2 fL (ref 78.0–100.0)
Platelets: 275 10*3/uL (ref 150–400)
RBC: 4.29 MIL/uL (ref 3.87–5.11)
RDW: 13.2 % (ref 11.5–15.5)
WBC: 10.7 10*3/uL — ABNORMAL HIGH (ref 4.0–10.5)

## 2016-07-31 LAB — I-STAT BETA HCG BLOOD, ED (MC, WL, AP ONLY)

## 2016-07-31 LAB — LIPASE, BLOOD: Lipase: 29 U/L (ref 11–51)

## 2016-07-31 MED ORDER — ONDANSETRON 4 MG PO TBDP
4.0000 mg | ORAL_TABLET | Freq: Once | ORAL | Status: AC | PRN
  Administered 2016-07-31: 4 mg via ORAL
  Filled 2016-07-31: qty 1

## 2016-07-31 MED ORDER — ONDANSETRON 8 MG PO TBDP: 8.0000 mg | ORAL_TABLET | Freq: Once | ORAL | Status: DC

## 2016-07-31 MED ORDER — OMEPRAZOLE 20 MG PO CPDR: 20.0000 mg | DELAYED_RELEASE_CAPSULE | Freq: Every day | ORAL | 0 refills | Status: DC

## 2016-07-31 NOTE — Discharge Instructions (Signed)
As discussed, your evaluation today has been largely reassuring.  But, it is important that you monitor your condition carefully, and do not hesitate to return to the ED if you develop new, or concerning changes in your condition. ? ?Otherwise, please follow-up with your physician for appropriate ongoing care. ? ?

## 2016-07-31 NOTE — ED Triage Notes (Signed)
Patient reports right sided abdominal pain starting at 2am today. Patient reports nausea and diarrhea. Denies vomiting or any urinary symptoms.

## 2016-07-31 NOTE — ED Provider Notes (Signed)
[facility] DEPT Provider Note   CSN: [id number] Arrival date & time: 07/31/16  1605     History   Chief Complaint Chief Complaint  Patient presents with  . Abdominal Pain  . Emesis    HPI [name] is a 30 y.o. female.  HPI Patient presents with concern of right-sided abdominal pain. Pain began about 18 hours ago. Since that time the patient has had persistent pain, though the pain is migratory, throughout the right abdomen. No lower abdominal pain, no fever. There is associated nausea, diarrhea, but no vomiting, no urinary symptoms. Patient was well prior to the onset of symptoms.  Past Medical History:  Diagnosis Date  . Anxiety   . Cancer ([place]) 02/2012   thyroid cancer  . Thyroid disease    cancer    Patient Active Problem List   Diagnosis Date Noted  . Abortion, missed 02/07/2015    Past Surgical History:  Procedure Laterality Date  . CESAREAN SECTION  2006  . DILATION AND EVACUATION N/A 02/07/2015   Procedure: DILATATION AND EVACUATION;  Surgeon: Lavonia Drafts, MD;  Location: [facility];  Service: Gynecology;  Laterality: N/A;  . THYROIDECTOMY  02/2012   total L side first and R side 7 days later.    OB History    Gravida Para Term Preterm AB Living   2 1 1     1    SAB TAB Ectopic Multiple Live Births         1         Home Medications    Prior to Admission medications   Medication Sig Start Date End Date Taking? Authorizing Provider  albuterol (PROVENTIL HFA;VENTOLIN HFA) 108 (90 Base) MCG/ACT inhaler Inhale 1-2 puffs into the lungs every 6 (six) hours as needed for wheezing or shortness of breath.   Yes Historical Provider, MD  levothyroxine (SYNTHROID, LEVOTHROID) 200 MCG tablet Take 200 mcg by mouth daily before breakfast.    Yes Historical Provider, MD  benzonatate (TESSALON) 100 MG capsule Take 2 capsules (200 mg total) by mouth 2 (two) times daily as needed for cough. Patient not taking: Reported on 07/31/2016 07/22/16    Nona Dell, PA-C  oxymetazoline [facility] NASAL SPRAY) 0.05 % nasal spray Place 1 spray into both nostrils 2 (two) times daily. Spray once into each nostril twice daily for up to the next 3 days. Do not use for more than 3 days to prevent rebound rhinorrhea. Patient not taking: Reported on 07/31/2016 07/22/16   Nona Dell, PA-C    Family History Family History  Problem Relation Age of Onset  . Hypertension Mother   . Blindness Mother     one eye    Social History Social History  Substance Use Topics  . Smoking status: Never Smoker  . Smokeless tobacco: Never Used  . Alcohol use No     Allergies   Patient has no known allergies.   Review of Systems Review of Systems  Constitutional:       Per HPI, otherwise negative  HENT:       Per HPI, otherwise negative  Respiratory:       Per HPI, otherwise negative  Cardiovascular:       Per HPI, otherwise negative  Gastrointestinal: Positive for diarrhea and nausea. Negative for vomiting.  Endocrine:       Negative aside from HPI  Genitourinary:       Neg aside from HPI   Musculoskeletal:  Per HPI, otherwise negative  Skin: Negative.   Neurological: Negative for syncope.     Physical Exam Updated Vital Signs BP (!) 175/112 (BP Location: Right Arm)   Pulse 94   Temp 98.8 F (37.1 C) (Oral)   Resp 20   Ht 5\' 2"  (1.575 m)   Wt 295 lb 9.6 oz (134.1 kg)   LMP 07/08/2016   SpO2 100%   BMI 54.07 kg/m   Physical Exam  Constitutional: She is oriented to person, place, and time. She appears well-developed and well-nourished. No distress.  HENT:  Head: Normocephalic and atraumatic.  Eyes: Conjunctivae and EOM are normal.  Cardiovascular: Normal rate and regular rhythm.   Pulmonary/Chest: Effort normal and breath sounds normal. No stridor. No respiratory distress.  Abdominal: She exhibits no distension. There is tenderness.  Minimal tenderness to palpation about the epigastrium    Musculoskeletal: She exhibits no edema.  Neurological: She is alert and oriented to person, place, and time. No cranial nerve deficit.  Skin: Skin is warm and dry.  Psychiatric: She has a normal mood and affect.  Nursing note and vitals reviewed.    ED Treatments / Results  Labs (all labs ordered are listed, but only abnormal results are displayed) Labs Reviewed  COMPREHENSIVE METABOLIC PANEL - Abnormal; Notable for the following:       Result Value   Glucose, Bld 113 (*)    Calcium 8.8 (*)    All other components within normal limits  CBC - Abnormal; Notable for the following:    WBC 10.7 (*)    All other components within normal limits  URINALYSIS, ROUTINE W REFLEX MICROSCOPIC - Abnormal; Notable for the following:    Hgb urine dipstick SMALL (*)    Squamous Epithelial / LPF 0-5 (*)    All other components within normal limits  LIPASE, BLOOD  I-STAT BETA HCG BLOOD, ED ([facility], [facility], [facility] ONLY)    Procedures Procedures (including critical care time)  Medications Ordered in ED Medications  ondansetron (ZOFRAN-ODT) disintegrating tablet 8 mg (not administered)  ondansetron (ZOFRAN-ODT) disintegrating tablet 4 mg (4 mg Oral Given 07/31/16 1625)  ondansetron (ZOFRAN-ODT) disintegrating tablet 4 mg (4 mg Oral Given 07/31/16 2123)     Initial Impression / Assessment and Plan / ED Course  I have reviewed the triage vital signs and the nursing notes.  Pertinent labs & imaging results that were available during my care of the patient were reviewed by me and considered in my medical decision making (see chart for details).  Clinical Course    10:16 PM Awake alert ambulatory. She is aware of all findings, concern for gastroesophageal etiology, the absence of lateral abdominal pain, persistent symptoms, abnormal vitals. Patient started on PPI therapy, discharged in stable condition.  Final Clinical Impressions(s) / ED Diagnoses   Final diagnoses:  Epigastric pain    New  Prescriptions New Prescriptions   OMEPRAZOLE (PRILOSEC) 20 MG CAPSULE    Take 1 capsule (20 mg total) by mouth daily. Take one tablet daily     Carmin Muskrat, MD 07/31/16 2216

## 2016-09-02 ENCOUNTER — Encounter (HOSPITAL_COMMUNITY): Payer: Self-pay | Admitting: Emergency Medicine

## 2016-09-02 ENCOUNTER — Emergency Department (HOSPITAL_COMMUNITY)
Admission: EM | Admit: 2016-09-02 | Discharge: 2016-09-02 | Disposition: A | Discharge status: Home / Self Care | Payer: Medicaid Other | Attending: Emergency Medicine | Admitting: Emergency Medicine

## 2016-09-02 DIAGNOSIS — B001 Herpesviral vesicular dermatitis: Secondary | ICD-10-CM

## 2016-09-02 DIAGNOSIS — Z85850 Personal history of malignant neoplasm of thyroid: Secondary | ICD-10-CM | POA: Insufficient documentation

## 2016-09-02 DIAGNOSIS — E039 Hypothyroidism, unspecified: Secondary | ICD-10-CM | POA: Insufficient documentation

## 2016-09-02 DIAGNOSIS — R1013 Epigastric pain: Secondary | ICD-10-CM | POA: Insufficient documentation

## 2016-09-02 LAB — CBC
HEMATOCRIT: 39.2 % (ref 36.0–46.0)
Hemoglobin: 13.2 g/dL (ref 12.0–15.0)
MCH: 30.1 pg (ref 26.0–34.0)
MCHC: 33.7 g/dL (ref 30.0–36.0)
MCV: 89.5 fL (ref 78.0–100.0)
Platelets: 226 10*3/uL (ref 150–400)
RBC: 4.38 MIL/uL (ref 3.87–5.11)
RDW: 12.9 % (ref 11.5–15.5)
WBC: 9 10*3/uL (ref 4.0–10.5)

## 2016-09-02 LAB — I-STAT BETA HCG BLOOD, ED (MC, WL, AP ONLY): I-stat hCG, quantitative: <5 m[IU]/mL (ref <5)

## 2016-09-02 LAB — BASIC METABOLIC PANEL
Anion gap: 7 (ref 5–15)
BUN: 10 mg/dL (ref 6–20)
CO2: 25 mmol/L (ref 22–32)
Calcium: 8.7 mg/dL — ABNORMAL LOW (ref 8.9–10.3)
Chloride: 108 mmol/L (ref 101–111)
Creatinine, Ser: 0.86 mg/dL (ref 0.44–1.00)
GFR calc non Af Amer: >60 mL/min (ref >60)
Glucose, Bld: 122 mg/dL — ABNORMAL HIGH (ref 65–99)
POTASSIUM: 3.9 mmol/L (ref 3.5–5.1)
SODIUM: 140 mmol/L (ref 135–145)

## 2016-09-02 LAB — HEPATIC FUNCTION PANEL
ALBUMIN: 4 g/dL (ref 3.5–5.0)
ALK PHOS: 53 U/L (ref 38–126)
ALT: 13 U/L — AB (ref 14–54)
AST: 16 U/L (ref 15–41)
Bilirubin, Direct: 0.1 mg/dL (ref 0.1–0.5)
Indirect Bilirubin: 0.5 mg/dL (ref 0.3–0.9)
TOTAL PROTEIN: 6.8 g/dL (ref 6.5–8.1)
Total Bilirubin: 0.6 mg/dL (ref 0.3–1.2)

## 2016-09-02 LAB — TSH: TSH: 93.941 u[IU]/mL — ABNORMAL HIGH (ref 0.350–4.500)

## 2016-09-02 MED ORDER — LEVOTHYROXINE SODIUM 200 MCG PO TABS: 200.0000 ug | ORAL_TABLET | Freq: Every day | ORAL | 0 refills | Status: DC

## 2016-09-02 MED ORDER — ACYCLOVIR 5 % EX OINT: 1.0000 "application " | TOPICAL_OINTMENT | CUTANEOUS | 0 refills | Status: DC

## 2016-09-02 MED ORDER — OMEPRAZOLE 20 MG PO CPDR: 20.0000 mg | DELAYED_RELEASE_CAPSULE | Freq: Every day | ORAL | 0 refills | Status: DC

## 2016-09-02 MED ORDER — GI COCKTAIL ~~LOC~~
30.0000 mL | Freq: Once | ORAL | Status: AC
  Administered 2016-09-02: 30 mL via ORAL
  Filled 2016-09-02: qty 30

## 2016-09-02 NOTE — ED Notes (Signed)
Pt is in stable condition upon d/c and ambulates from ED. 

## 2016-09-02 NOTE — ED Notes (Signed)
Pt states she has been out of her thyroid medicine "for a while".

## 2016-09-02 NOTE — ED Triage Notes (Signed)
Per pt, she has hx of thyroid cancer with total thyroidectomy, has been out of thyroid medicine x3 weeks, c/o extreme fatigue, drowsy, eating more, weight gain, being cold. Pt also states her lip is swollen for unknown reason. Pt in NAD

## 2016-09-02 NOTE — Discharge Instructions (Signed)
Please take Levothyroxine every day before breakfast. Please take omeprazole soon as you wake up 30 minutes before meals. Take Zovirax application topically every 3 hours on area and cold compress. Please schedule point with your endocrinologist today. Please schedule point with your primary care provider in 2 to 5 days. Please see attached financial assistance reference guide.  Get help right away if: You develop chest pain. You develop an irregular heartbeat. You develop a rapid heartbeat. Your pain does not go away as soon as your health care provider told you to expect. You cannot stop throwing up. Your pain is only in areas of the abdomen, such as the right side or the left lower portion of the abdomen. You have bloody or black stools, or stools that look like tar. You have severe pain, cramping, or bloating in your abdomen. You have signs of dehydration, such as: Dark urine, very little urine, or no urine. Cracked lips. Dry mouth. Sunken eyes. Sleepiness. Weakness. You have a fever or persistent symptoms for more than 2-3 days.   You have a fever and your symptoms suddenly get worse.   You have pus, not clear fluid, coming from the sores.   You have redness that is spreading.   You have pain or irritation in your eye.   You get sores on your genitals.   Your sores do not heal within 2 weeks.   You have a weakened immune system.   You have frequent recurrences of cold sores.

## 2016-09-02 NOTE — ED Notes (Signed)
Called lab to add on HFP.

## 2016-09-03 NOTE — ED Provider Notes (Signed)
[facility] DEPT Provider Note   CSN: [id number] Arrival date & time: 09/02/16  0730     History   Chief Complaint Chief Complaint  Patient presents with  . Fatigue  . Oral Swelling  . Weight Gain    HPI [name] is a 31 y.o. female with history of follicular thyroid cancer and total thyroidectomy presents to the ED today complaining of worsening extreme fatigue, drowsy, weight gain, and being cold 3 weeks. She states that she has been out of her thyroid medications for 3 weeks and has not been able to fill them due to complications with her insurance. She states these are her typical symptoms when she stops taking her thyroid medications. She denies trying anything for her symptoms. Nothing makes her symptoms better or worse. She denies fevers, chills, changes in urinary symptoms and changes in bowel movements. She also complains of right upper lip swelling since yesterday when she started a new lip balm. She notes mild pain to the area. She admits that her right upper lip swelling is what made her come in today is most concerning for her. She also reports being seen in the emergency department December 28 for recurrent vomiting episodes and abdominal pain. She states she was worked up and all her results came back negative stating that her symptoms may be stomach related.  The history is provided by the patient. No language interpreter was used.    Past Medical History:  Diagnosis Date  . Anxiety   . Cancer ([place]) 02/2012   thyroid cancer  . Thyroid disease    cancer    Patient Active Problem List   Diagnosis Date Noted  . Abortion, missed 02/07/2015    Past Surgical History:  Procedure Laterality Date  . CESAREAN SECTION  2006  . DILATION AND EVACUATION N/A 02/07/2015   Procedure: DILATATION AND EVACUATION;  Surgeon: Lavonia Drafts, MD;  Location: [facility];  Service: Gynecology;  Laterality: N/A;  . THYROIDECTOMY  02/2012   total L side first and R side 7  days later.    OB History    Gravida Para Term Preterm AB Living   2 1 1     1    SAB TAB Ectopic Multiple Live Births         1         Home Medications    Prior to Admission medications   Medication Sig Start Date End Date Taking? Authorizing Provider  acyclovir ointment (ZOVIRAX) 5 % Apply 1 application topically every 3 (three) hours. 09/02/16   [facility] [place], [place]  benzonatate (TESSALON) 100 MG capsule Take 2 capsules (200 mg total) by mouth 2 (two) times daily as needed for cough. Patient not taking: Reported on 07/31/2016 07/22/16   Nona Dell, PA-C  levothyroxine (SYNTHROID, LEVOTHROID) 200 MCG tablet Take 1 tablet (200 mcg total) by mouth daily before breakfast. 09/02/16 10/02/16  [facility] [place], [place]  omeprazole (PRILOSEC) 20 MG capsule Take 1 capsule (20 mg total) by mouth daily. Take one tablet daily 09/02/16   [facility] [place], [place]  oxymetazoline (AFRIN NASAL SPRAY) 0.05 % nasal spray Place 1 spray into both nostrils 2 (two) times daily. Spray once into each nostril twice daily for up to the next 3 days. Do not use for more than 3 days to prevent rebound rhinorrhea. Patient not taking: Reported on 07/31/2016 07/22/16   Nona Dell, PA-C    Family History Family History  Problem Relation Age of  Onset  . Hypertension Mother   . Blindness Mother     one eye    Social History Social History  Substance Use Topics  . Smoking status: Never Smoker  . Smokeless tobacco: Never Used  . Alcohol use No     Allergies   Patient has no known allergies.   Review of Systems Review of Systems  Constitutional: Positive for appetite change, fatigue and unexpected weight change (Weight gain). Negative for chills and fever.  HENT: Positive for mouth sores.   Respiratory: Negative for shortness of breath.   Cardiovascular: Negative for chest pain.  Gastrointestinal: Positive for abdominal pain, nausea and vomiting.  Endocrine: Positive  for cold intolerance.  Genitourinary: Negative for difficulty urinating and dysuria.  Musculoskeletal: Negative for neck pain and neck stiffness.  Skin: Negative for rash and wound.  Neurological: Negative for numbness.  All other systems reviewed and are negative.    Physical Exam Updated Vital Signs BP 161/98   Pulse 80   Temp 98.5 F (36.9 C) (Oral)   Resp 18   LMP  (LMP Unknown)   SpO2 100%   Physical Exam  Constitutional: She is oriented to person, place, and time. She appears well-developed and well-nourished.  Well-appearing  HENT:  Head: Normocephalic and atraumatic.  Nose: Nose normal.  Mouth/Throat: Oropharynx is clear and moist.  Right upper lip mildly swollen, red, with multiple raised vesicular lesions on erythematous base  Eyes: Conjunctivae and EOM are normal. Pupils are equal, round, and reactive to light.  Neck: Normal range of motion. Neck supple. No tracheal deviation present. No thyromegaly present.  Normal range of motion. No nuchal rigidity.  Cardiovascular: Normal rate and normal heart sounds.   Pulmonary/Chest: Effort normal and breath sounds normal. No respiratory distress. She exhibits no tenderness.  CTA. Normal work of breathing.  Abdominal: Soft. Bowel sounds are normal. There is tenderness. There is guarding. There is no rebound.  Upper Abdominal tenderness to palpation, maximal tenderness at mid epigastric region. Lower abdomen nontender. Negative Murphy sign. No focal tenderness at McBurney's point. No CVA tenderness.  Musculoskeletal: Normal range of motion.  Neurological: She is alert and oriented to person, place, and time.  Skin: Skin is warm. Capillary refill takes less than 2 seconds.  Psychiatric: She has a normal mood and affect. Her behavior is normal.  Nursing note and vitals reviewed.    ED Treatments / Results  Labs (all labs ordered are listed, but only abnormal results are displayed) Labs Reviewed  TSH - Abnormal; Notable  for the following:       Result Value   TSH 93.941 (*)    All other components within normal limits  BASIC METABOLIC PANEL - Abnormal; Notable for the following:    Glucose, Bld 122 (*)    Calcium 8.7 (*)    All other components within normal limits  HEPATIC FUNCTION PANEL - Abnormal; Notable for the following:    ALT 13 (*)    All other components within normal limits  CBC  I-STAT BETA HCG BLOOD, ED ([facility], [facility], [facility] ONLY)    EKG  EKG Interpretation None       Radiology No results found.  Procedures Procedures (including critical care time)  Medications Ordered in ED Medications  gi cocktail (Maalox,Lidocaine,Donnatal) (30 mLs Oral Given 09/02/16 1159)     Initial Impression / Assessment and Plan / ED Course  I have reviewed the triage vital signs and the nursing notes.  Pertinent labs & imaging  results that were available during my care of the patient were reviewed by me and considered in my medical decision making (see chart for details).     Patient's history and symptoms consistent with Hypothyoidism. Patient has a history of thyroidectomy has not taken her thyroid medication in 3 weeks. TSH is at 93. All other lab work is unremarkable.   Patient with abdominal symptoms consistent with gastritis.  Vitals are stable, no fever or tachycardia.  Patient is nontoxic, nonseptic appearing, in no apparent distress.    Pt's symptoms have been managed in the department.  No signs of dehydration, tolerating PO fluids > 6 oz.   no vomiting episodes in the ED. Lungs are clear.  No peritoneal signs, no concern for appendicitis, cholecystitis, pancreatitis, ruptured viscus, UTI, kidney stone, PID, ectopic pregnancy.  Lab work is reassuring.  Patient's complaint of right upper lip swelling consistent with a cold sore. It is a multiple raised vesicular lesions on erythematous base. Patient prescribed Zovirax cream to apply to the area.  Supportive therapy indicated.  Patient given strict  instructions to schedule appointment with endocrinologist today for follow-up regarding today's visit. Prescribed PPI to go home with with referral to primary care provider in 2-5 days regarding today's visit.  Patient also given financial assistance resource guide. Patient counseled, expresses understanding and agrees with plan. Return precautions given.  Final Clinical Impressions(s) / ED Diagnoses   Final diagnoses:  Epigastric pain  Cold sore  Hypothyroidism, unspecified type    New Prescriptions Discharge Medication List as of 09/02/2016 11:55 AM    START taking these medications   Details  acyclovir ointment (ZOVIRAX) 5 % Apply 1 application topically every 3 (three) hours., Starting Tue 09/02/2016, [place], [place] 09/03/16 [id number]    Dorie Rank, MD 09/03/16 1210

## 2016-09-11 DIAGNOSIS — E890 Postprocedural hypothyroidism: Secondary | ICD-10-CM | POA: Insufficient documentation

## 2016-09-11 DIAGNOSIS — C73 Malignant neoplasm of thyroid gland: Secondary | ICD-10-CM | POA: Insufficient documentation

## 2016-10-31 ENCOUNTER — Emergency Department (HOSPITAL_COMMUNITY)
Admission: EM | Admit: 2016-10-31 | Discharge: 2016-10-31 | Disposition: A | Discharge status: Home / Self Care | Payer: Medicaid Other | Attending: Emergency Medicine | Admitting: Emergency Medicine

## 2016-10-31 ENCOUNTER — Emergency Department (HOSPITAL_COMMUNITY): Payer: Medicaid Other

## 2016-10-31 ENCOUNTER — Encounter (HOSPITAL_COMMUNITY): Payer: Self-pay

## 2016-10-31 DIAGNOSIS — Z85850 Personal history of malignant neoplasm of thyroid: Secondary | ICD-10-CM | POA: Insufficient documentation

## 2016-10-31 DIAGNOSIS — R51 Headache: Secondary | ICD-10-CM | POA: Insufficient documentation

## 2016-10-31 DIAGNOSIS — Y939 Activity, unspecified: Secondary | ICD-10-CM | POA: Insufficient documentation

## 2016-10-31 DIAGNOSIS — M7918 Myalgia, other site: Secondary | ICD-10-CM

## 2016-10-31 DIAGNOSIS — M542 Cervicalgia: Secondary | ICD-10-CM | POA: Insufficient documentation

## 2016-10-31 DIAGNOSIS — S8012XA Contusion of left lower leg, initial encounter: Secondary | ICD-10-CM | POA: Insufficient documentation

## 2016-10-31 DIAGNOSIS — R1013 Epigastric pain: Secondary | ICD-10-CM | POA: Insufficient documentation

## 2016-10-31 DIAGNOSIS — Y999 Unspecified external cause status: Secondary | ICD-10-CM | POA: Insufficient documentation

## 2016-10-31 DIAGNOSIS — Y92410 Unspecified street and highway as the place of occurrence of the external cause: Secondary | ICD-10-CM | POA: Insufficient documentation

## 2016-10-31 DIAGNOSIS — M545 Low back pain: Secondary | ICD-10-CM | POA: Insufficient documentation

## 2016-10-31 LAB — LIPASE, BLOOD: Lipase: 17 U/L (ref 11–51)

## 2016-10-31 LAB — COMPREHENSIVE METABOLIC PANEL
ALT: 12 U/L — AB (ref 14–54)
AST: 19 U/L (ref 15–41)
Albumin: 4.1 g/dL (ref 3.5–5.0)
Alkaline Phosphatase: 59 U/L (ref 38–126)
Anion gap: 6 (ref 5–15)
BUN: 11 mg/dL (ref 6–20)
CHLORIDE: 110 mmol/L (ref 101–111)
CO2: 27 mmol/L (ref 22–32)
CREATININE: 0.82 mg/dL (ref 0.44–1.00)
Calcium: 8.5 mg/dL — ABNORMAL LOW (ref 8.9–10.3)
GFR calc Af Amer: >60 mL/min (ref >60)
GFR calc non Af Amer: >60 mL/min (ref >60)
Glucose, Bld: 108 mg/dL — ABNORMAL HIGH (ref 65–99)
Potassium: 3.4 mmol/L — ABNORMAL LOW (ref 3.5–5.1)
SODIUM: 143 mmol/L (ref 135–145)
TOTAL PROTEIN: 7.4 g/dL (ref 6.5–8.1)
Total Bilirubin: 0.6 mg/dL (ref 0.3–1.2)

## 2016-10-31 LAB — CBC
HEMATOCRIT: 39 % (ref 36.0–46.0)
HEMOGLOBIN: 13.2 g/dL (ref 12.0–15.0)
MCH: 30.2 pg (ref 26.0–34.0)
MCHC: 33.8 g/dL (ref 30.0–36.0)
MCV: 89.2 fL (ref 78.0–100.0)
Platelets: 278 10*3/uL (ref 150–400)
RBC: 4.37 MIL/uL (ref 3.87–5.11)
RDW: 12.6 % (ref 11.5–15.5)
WBC: 10.2 10*3/uL (ref 4.0–10.5)

## 2016-10-31 LAB — I-STAT BETA HCG BLOOD, ED (MC, WL, AP ONLY): I-stat hCG, quantitative: <5 m[IU]/mL (ref <5)

## 2016-10-31 MED ORDER — CYCLOBENZAPRINE HCL 10 MG PO TABS
10.0000 mg | ORAL_TABLET | Freq: Once | ORAL | Status: AC
  Administered 2016-10-31: 10 mg via ORAL
  Filled 2016-10-31: qty 1

## 2016-10-31 MED ORDER — IBUPROFEN 800 MG PO TABS: 800.0000 mg | ORAL_TABLET | Freq: Three times a day (TID) | ORAL | 0 refills | Status: AC | PRN

## 2016-10-31 MED ORDER — IBUPROFEN 800 MG PO TABS
800.0000 mg | ORAL_TABLET | Freq: Once | ORAL | Status: AC
  Administered 2016-10-31: 800 mg via ORAL
  Filled 2016-10-31: qty 1

## 2016-10-31 MED ORDER — CYCLOBENZAPRINE HCL 10 MG PO TABS: 10.0000 mg | ORAL_TABLET | Freq: Three times a day (TID) | ORAL | 0 refills | Status: AC | PRN
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
## 2016-10-31 NOTE — Discharge Instructions (Signed)
Take flexeril as needed for muscle spasm and ibuprofen for pain/inflammation, You may use ice, heat, and gentle stretching to help with your symptoms. Follow up with your primary care for re-evaluation. If you experience any fever/chills, passing out, loss of bowel or bladder control, or anything concerning, return to the ED.
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
## 2016-12-30 ENCOUNTER — Inpatient Hospital Stay (HOSPITAL_COMMUNITY)
Admission: AD | Admit: 2016-12-30 | Discharge: 2016-12-30 | Disposition: A | Discharge status: Home / Self Care | Payer: Medicaid Other | Source: Ambulatory Visit | Attending: Family Medicine | Admitting: Family Medicine

## 2016-12-30 ENCOUNTER — Encounter (HOSPITAL_COMMUNITY): Payer: Self-pay | Admitting: *Deleted

## 2016-12-30 DIAGNOSIS — Z3202 Encounter for pregnancy test, result negative: Secondary | ICD-10-CM | POA: Insufficient documentation

## 2016-12-30 DIAGNOSIS — N911 Secondary amenorrhea: Secondary | ICD-10-CM

## 2016-12-30 DIAGNOSIS — Z79899 Other long term (current) drug therapy: Secondary | ICD-10-CM | POA: Diagnosis not present

## 2016-12-30 DIAGNOSIS — N926 Irregular menstruation, unspecified: Secondary | ICD-10-CM | POA: Diagnosis not present

## 2016-12-30 DIAGNOSIS — R112 Nausea with vomiting, unspecified: Secondary | ICD-10-CM | POA: Diagnosis present

## 2016-12-30 DIAGNOSIS — F419 Anxiety disorder, unspecified: Secondary | ICD-10-CM | POA: Insufficient documentation

## 2016-12-30 DIAGNOSIS — E039 Hypothyroidism, unspecified: Secondary | ICD-10-CM | POA: Diagnosis not present

## 2016-12-30 DIAGNOSIS — K529 Noninfective gastroenteritis and colitis, unspecified: Secondary | ICD-10-CM | POA: Insufficient documentation

## 2016-12-30 LAB — COMPREHENSIVE METABOLIC PANEL
ALK PHOS: 64 U/L (ref 38–126)
ALT: 16 U/L (ref 14–54)
ANION GAP: 7 (ref 5–15)
AST: 15 U/L (ref 15–41)
Albumin: 4.1 g/dL (ref 3.5–5.0)
BUN: 15 mg/dL (ref 6–20)
CO2: 28 mmol/L (ref 22–32)
Calcium: 9.1 mg/dL (ref 8.9–10.3)
Chloride: 103 mmol/L (ref 101–111)
Creatinine, Ser: 0.75 mg/dL (ref 0.44–1.00)
GFR calc non Af Amer: >60 mL/min (ref >60)
Glucose, Bld: 108 mg/dL — ABNORMAL HIGH (ref 65–99)
POTASSIUM: 3.9 mmol/L (ref 3.5–5.1)
SODIUM: 138 mmol/L (ref 135–145)
Total Bilirubin: 0.6 mg/dL (ref 0.3–1.2)
Total Protein: 7.3 g/dL (ref 6.5–8.1)

## 2016-12-30 LAB — POCT PREGNANCY, URINE: Preg Test, Ur: NEGATIVE

## 2016-12-30 LAB — URINALYSIS, ROUTINE W REFLEX MICROSCOPIC
Bilirubin Urine: NEGATIVE
GLUCOSE, UA: NEGATIVE mg/dL
KETONES UR: NEGATIVE mg/dL
NITRITE: NEGATIVE
PROTEIN: NEGATIVE mg/dL
Specific Gravity, Urine: 1.027 (ref 1.005–1.030)
pH: 6 (ref 5.0–8.0)

## 2016-12-30 LAB — CBC WITH DIFFERENTIAL/PLATELET
BASOS PCT: 0 %
Basophils Absolute: 0 10*3/uL (ref 0.0–0.1)
EOS ABS: 0 10*3/uL (ref 0.0–0.7)
EOS PCT: 0 %
HEMATOCRIT: 38.7 % (ref 36.0–46.0)
Hemoglobin: 13.1 g/dL (ref 12.0–15.0)
Lymphocytes Relative: 25 %
Lymphs Abs: 2.6 10*3/uL (ref 0.7–4.0)
MCH: 30 pg (ref 26.0–34.0)
MCHC: 33.9 g/dL (ref 30.0–36.0)
MCV: 88.8 fL (ref 78.0–100.0)
MONO ABS: 0.7 10*3/uL (ref 0.1–1.0)
MONOS PCT: 7 %
Neutro Abs: 7 10*3/uL (ref 1.7–7.7)
Neutrophils Relative %: 68 %
PLATELETS: 258 10*3/uL (ref 150–400)
RBC: 4.36 MIL/uL (ref 3.87–5.11)
RDW: 12.9 % (ref 11.5–15.5)
WBC: 10.3 10*3/uL (ref 4.0–10.5)

## 2016-12-30 LAB — HCG, QUANTITATIVE, PREGNANCY

## 2016-12-30 MED ORDER — ONDANSETRON HCL 4 MG PO TABS: 4.0000 mg | ORAL_TABLET | Freq: Four times a day (QID) | ORAL | 0 refills | Status: DC

## 2016-12-30 MED ORDER — METOCLOPRAMIDE HCL 10 MG PO TABS
10.0000 mg | ORAL_TABLET | Freq: Once | ORAL | Status: AC
  Administered 2016-12-30: 10 mg via ORAL
  Filled 2016-12-30: qty 1

## 2016-12-30 NOTE — MAU Provider Note (Signed)
History     CSN: [id number]  Arrival date and time: 12/30/16 1713   First Provider Initiated Contact with Patient 12/30/16 1753      No chief complaint on file.  HPI Ms. [name] is a 31 y.o. G2P1001 who presents to MAU today with complaint of possible pregnancy and N/V/D for 2 weeks. She states LMP around mid-March. She states irregular periods due to hypothyroidism. She denies fever, vaginal bleeding, discharge or UTI symptoms. She denies pain now, but states severe RUQ abdominal cramping earlier. She does not take pain medication.   OB History    Gravida Para Term Preterm AB Living   2 1 1     1    SAB TAB Ectopic Multiple Live Births         1        Past Medical History:  Diagnosis Date  . Anxiety   . Cancer ([place]) 02/2012   thyroid cancer  . Thyroid disease    cancer    Past Surgical History:  Procedure Laterality Date  . CESAREAN SECTION  2006  . DILATION AND EVACUATION N/A 02/07/2015   Procedure: DILATATION AND EVACUATION;  Surgeon: Lavonia Drafts, MD;  Location: [facility];  Service: Gynecology;  Laterality: N/A;  . THYROIDECTOMY  02/2012   total L side first and R side 7 days later.    Family History  Problem Relation Age of Onset  . Hypertension Mother   . Blindness Mother        one eye    Social History  Substance Use Topics  . Smoking status: Never Smoker  . Smokeless tobacco: Never Used  . Alcohol use No    Allergies: No Known Allergies  Prescriptions Prior to Admission  Medication Sig Dispense Refill Last Dose  . levothyroxine (SYNTHROID, LEVOTHROID) 200 MCG tablet Take 1 tablet (200 mcg total) by mouth daily before breakfast. 30 tablet 0 12/30/2016 at Unknown time  . acyclovir ointment (ZOVIRAX) 5 % Apply 1 application topically every 3 (three) hours. (Patient not taking: Reported on 10/31/2016) 5 g 0 Not Taking at Unknown time  . omeprazole (PRILOSEC) 20 MG capsule Take 1 capsule (20 mg total) by mouth daily. Take one tablet daily  (Patient not taking: Reported on 10/31/2016) 30 capsule 0 Not Taking at Unknown time    Review of Systems  Constitutional: Negative for fever.  Gastrointestinal: Positive for abdominal pain, diarrhea, nausea and vomiting. Negative for constipation.  Genitourinary: Negative for dysuria, frequency, urgency, vaginal bleeding and vaginal discharge.   Physical Exam   Blood pressure (!) 157/105, pulse 98, temperature 98.2 F (36.8 C), temperature source Oral, resp. rate 18, weight 277 lb (125.6 kg), last menstrual period 10/20/2016, SpO2 100 %, unknown if currently breastfeeding.  Physical Exam  Nursing note and vitals reviewed. Constitutional: She is oriented to person, place, and time. She appears well-developed and well-nourished. No distress.  HENT:  Head: Normocephalic and atraumatic.  Cardiovascular: Normal rate.   Respiratory: Effort normal.  GI: Soft. She exhibits no distension and no mass. There is no tenderness. There is no rebound and no guarding.  Neurological: She is alert and oriented to person, place, and time.  Skin: Skin is warm and dry. No erythema.  Psychiatric: She has a normal mood and affect.    Results for orders placed or performed during the hospital encounter of 12/30/16 (from the past 24 hour(s))  Urinalysis, Routine w reflex microscopic     Status: Abnormal   Collection Time:  12/30/16  5:24 PM  Result Value Ref Range   Color, Urine YELLOW YELLOW   APPearance HAZY (A) CLEAR   Specific Gravity, Urine 1.027 1.005 - 1.030   pH 6.0 5.0 - 8.0   Glucose, UA NEGATIVE NEGATIVE mg/dL   Hgb urine dipstick MODERATE (A) NEGATIVE   Bilirubin Urine NEGATIVE NEGATIVE   Ketones, ur NEGATIVE NEGATIVE mg/dL   Protein, ur NEGATIVE NEGATIVE mg/dL   Nitrite NEGATIVE NEGATIVE   Leukocytes, UA TRACE (A) NEGATIVE   RBC / HPF 6-30 0 - 5 RBC/hpf   WBC, UA 0-5 0 - 5 WBC/hpf   Bacteria, UA FEW (A) NONE SEEN   Squamous Epithelial / LPF 6-30 (A) NONE SEEN   Mucous PRESENT    Pregnancy, urine POC     Status: None   Collection Time: 12/30/16  5:33 PM  Result Value Ref Range   Preg Test, Ur NEGATIVE NEGATIVE  CBC with Differential/Platelet     Status: None   Collection Time: 12/30/16  6:00 PM  Result Value Ref Range   WBC 10.3 4.0 - 10.5 K/uL   RBC 4.36 3.87 - 5.11 MIL/uL   Hemoglobin 13.1 12.0 - 15.0 g/dL   HCT 38.7 36.0 - 46.0 %   MCV 88.8 78.0 - 100.0 fL   MCH 30.0 26.0 - 34.0 pg   MCHC 33.9 30.0 - 36.0 g/dL   RDW 12.9 11.5 - 15.5 %   Platelets 258 150 - 400 K/uL   Neutrophils Relative % 68 %   Neutro Abs 7.0 1.7 - 7.7 K/uL   Lymphocytes Relative 25 %   Lymphs Abs 2.6 0.7 - 4.0 K/uL   Monocytes Relative 7 %   Monocytes Absolute 0.7 0.1 - 1.0 K/uL   Eosinophils Relative 0 %   Eosinophils Absolute 0.0 0.0 - 0.7 K/uL   Basophils Relative 0 %   Basophils Absolute 0.0 0.0 - 0.1 K/uL  hCG, quantitative, pregnancy     Status: None   Collection Time: 12/30/16  6:00 PM  Result Value Ref Range   hCG, Beta Chain, Quant, S <1 <5 mIU/mL  Comprehensive metabolic panel     Status: Abnormal   Collection Time: 12/30/16  6:00 PM  Result Value Ref Range   Sodium 138 135 - 145 mmol/L   Potassium 3.9 3.5 - 5.1 mmol/L   Chloride 103 101 - 111 mmol/L   CO2 28 22 - 32 mmol/L   Glucose, Bld 108 (H) 65 - 99 mg/dL   BUN 15 6 - 20 mg/dL   Creatinine, Ser 0.75 0.44 - 1.00 mg/dL   Calcium 9.1 8.9 - 10.3 mg/dL   Total Protein 7.3 6.5 - 8.1 g/dL   Albumin 4.1 3.5 - 5.0 g/dL   AST 15 15 - 41 U/L   ALT 16 14 - 54 U/L   Alkaline Phosphatase 64 38 - 126 U/L   Total Bilirubin 0.6 0.3 - 1.2 mg/dL   GFR calc non Af Amer >60 >60 mL/min   GFR calc Af Amer >60 >60 mL/min   Anion gap 7 5 - 15    MAU Course  Procedures None  MDM UPT - negative  Patient reports +HPT today. Quant hCG ordered to confirm.  Urine culture sent Assessment and Plan  A: Negative pregnancy test Irregular menses Gastroenteritis   P: Discharge home Rx for Zofran given to patient for N/V  PRN Warning signs for worsening condition discussed Patient advised to follow-up with GI specialist of choice. Contact information for  [facility] given.  Patient may return to MAU as needed or if her condition were to change or worsen, however since the patient is not pregnant, advised to follow-up with [facility], [facility] or Urgent Care if symptoms persist or worsen prior to GI follow-up  Kerry Hough, PA-C 12/30/2016, 6:57 PM

## 2016-12-30 NOTE — Discharge Instructions (Signed)
Food Choices to Help Relieve Diarrhea, Adult When you have diarrhea, the foods you eat and your eating habits are very important. Choosing the right foods and drinks can help:  Relieve diarrhea.  Replace lost fluids and nutrients.  Prevent dehydration.  What general guidelines should I follow? Relieving diarrhea  Choose foods with less than 2 g or .07 oz. of fiber per serving.  Limit fats to less than 8 tsp (38 g or 1.34 oz.) a day.  Avoid the following: ? Foods and beverages sweetened with high-fructose corn syrup, honey, or sugar alcohols such as xylitol, sorbitol, and mannitol. ? Foods that contain a lot of fat or sugar. ? Fried, greasy, or spicy foods. ? High-fiber grains, breads, and cereals. ? Raw fruits and vegetables.  Eat foods that are rich in probiotics. These foods include dairy products such as yogurt and fermented milk products. They help increase healthy bacteria in the stomach and intestines (gastrointestinal tract, or GI tract).  If you have lactose intolerance, avoid dairy products. These may make your diarrhea worse.  Take medicine to help stop diarrhea (antidiarrheal medicine) only as told by your health care provider. Replacing nutrients  Eat small meals or snacks every 3-4 hours.  Eat bland foods, such as white rice, toast, or baked potato, until your diarrhea starts to get better. Gradually reintroduce nutrient-rich foods as tolerated or as told by your health care provider. This includes: ? Well-cooked protein foods. ? Peeled, seeded, and soft-cooked fruits and vegetables. ? Low-fat dairy products.  Take vitamin and mineral supplements as told by your health care provider. Preventing dehydration   Start by sipping water or a special solution to prevent dehydration (oral rehydration solution, ORS). Urine that is clear or pale yellow means that you are getting enough fluid.  Try to drink at least 8-10 cups of fluid each day to help replace lost  fluids.  You may add other liquids in addition to water, such as clear juice or decaffeinated sports drinks, as tolerated or as told by your health care provider.  Avoid drinks with caffeine, such as coffee, tea, or soft drinks.  Avoid alcohol. What foods are recommended? The items listed may not be a complete list. Talk with your health care provider about what dietary choices are best for you. Grains White rice. White, French, or pita breads (fresh or toasted), including plain rolls, buns, or bagels. White pasta. Saltine, soda, or graham crackers. Pretzels. Low-fiber cereal. Cooked cereals made with water (such as cornmeal, farina, or cream cereals). Plain muffins. Matzo. Melba toast. Zwieback. Vegetables Potatoes (without the skin). Most well-cooked and canned vegetables without skins or seeds. Tender lettuce. Fruits Apple sauce. Fruits canned in juice. Cooked apricots, cherries, grapefruit, peaches, pears, or plums. Fresh bananas and cantaloupe. Meats and other protein foods Baked or boiled chicken. Eggs. Tofu. Fish. Seafood. Smooth nut butters. Ground or well-cooked tender beef, ham, veal, lamb, pork, or poultry. Dairy Plain yogurt, kefir, and unsweetened liquid yogurt. Lactose-free milk, buttermilk, skim milk, or soy milk. Low-fat or nonfat hard cheese. Beverages Water. Low-calorie sports drinks. Fruit juices without pulp. Strained tomato and vegetable juices. Decaffeinated teas. Sugar-free beverages not sweetened with sugar alcohols. Oral rehydration solutions, if approved by your health care provider. Seasoning and other foods Bouillon, broth, or soups made from recommended foods. What foods are not recommended? The items listed may not be a complete list. Talk with your health care provider about what dietary choices are best for you. Grains Whole grain, whole wheat,   bran, or rye breads, rolls, pastas, and crackers. Wild or brown rice. Whole grain or bran cereals. Barley. Oats and  oatmeal. Corn tortillas or taco shells. Granola. Popcorn. Vegetables Raw vegetables. Fried vegetables. Cabbage, broccoli, Brussels sprouts, artichokes, baked beans, beet greens, corn, kale, legumes, peas, sweet potatoes, and yams. Potato skins. Cooked spinach and cabbage. Fruits Dried fruit, including raisins and dates. Raw fruits. Stewed or dried prunes. Canned fruits with syrup. Meat and other protein foods Fried or fatty meats. Deli meats. Chunky nut butters. Nuts and seeds. Beans and lentils. Bacon. Hot dogs. Sausage. Dairy High-fat cheeses. Whole milk, chocolate milk, and beverages made with milk, such as milk shakes. Half-and-half. Cream. sour cream. Ice cream. Beverages Caffeinated beverages (such as coffee, tea, soda, or energy drinks). Alcoholic beverages. Fruit juices with pulp. Prune juice. Soft drinks sweetened with high-fructose corn syrup or sugar alcohols. High-calorie sports drinks. Fats and oils Butter. Cream sauces. Margarine. Salad oils. Plain salad dressings. Olives. Avocados. Mayonnaise. Sweets and desserts Sweet rolls, doughnuts, and sweet breads. Sugar-free desserts sweetened with sugar alcohols such as xylitol and sorbitol. Seasoning and other foods Honey. Hot sauce. Chili powder. Gravy. Cream-based or milk-based soups. Pancakes and waffles. Summary  When you have diarrhea, the foods you eat and your eating habits are very important.  Make sure you get at least 8-10 cups of fluid each day, or enough to keep your urine clear or pale yellow.  Eat bland foods and gradually reintroduce healthy, nutrient-rich foods as tolerated, or as told by your health care provider.  Avoid high-fiber, fried, greasy, or spicy foods. This information is not intended to replace advice given to you by your health care provider. Make sure you discuss any questions you have with your health care provider. Document Released: 10/11/2003 Document Revised: 07/18/2016 Document Reviewed:  07/18/2016 Elsevier Interactive Patient Education  2017 [employer]  

## 2016-12-30 NOTE — MAU Note (Addendum)
LMP 3/19 +HPT today  +N/V every morning; emesis x 2 in last 24 hours--states she is able to eat or drink  +diarrhea x1 today  +increase in swelling in both feet  States "im not feeling good"

## 2017-01-01 ENCOUNTER — Other Ambulatory Visit: Payer: Self-pay | Admitting: Medical

## 2017-01-01 LAB — CULTURE, OB URINE

## 2017-01-03 ENCOUNTER — Encounter (HOSPITAL_COMMUNITY): Payer: Self-pay

## 2017-01-03 ENCOUNTER — Emergency Department (HOSPITAL_BASED_OUTPATIENT_CLINIC_OR_DEPARTMENT_OTHER)
Admission: EM | Admit: 2017-01-03 | Discharge: 2017-01-03 | Disposition: A | Discharge status: Home / Self Care | Payer: Medicaid Other | Source: Home / Self Care | Attending: Emergency Medicine | Admitting: Emergency Medicine

## 2017-01-03 ENCOUNTER — Emergency Department (HOSPITAL_COMMUNITY): Payer: Medicaid Other

## 2017-01-03 ENCOUNTER — Emergency Department (HOSPITAL_COMMUNITY)
Admission: EM | Admit: 2017-01-03 | Discharge: 2017-01-03 | Disposition: A | Discharge status: Home / Self Care | Payer: Medicaid Other | Attending: Emergency Medicine | Admitting: Emergency Medicine

## 2017-01-03 DIAGNOSIS — Y999 Unspecified external cause status: Secondary | ICD-10-CM | POA: Insufficient documentation

## 2017-01-03 DIAGNOSIS — Y939 Activity, unspecified: Secondary | ICD-10-CM | POA: Insufficient documentation

## 2017-01-03 DIAGNOSIS — S4412XA Injury of median nerve at upper arm level, left arm, initial encounter: Secondary | ICD-10-CM | POA: Diagnosis not present

## 2017-01-03 DIAGNOSIS — H1013 Acute atopic conjunctivitis, bilateral: Secondary | ICD-10-CM

## 2017-01-03 DIAGNOSIS — Y929 Unspecified place or not applicable: Secondary | ICD-10-CM | POA: Insufficient documentation

## 2017-01-03 DIAGNOSIS — H1045 Other chronic allergic conjunctivitis: Secondary | ICD-10-CM | POA: Insufficient documentation

## 2017-01-03 DIAGNOSIS — R600 Localized edema: Secondary | ICD-10-CM | POA: Insufficient documentation

## 2017-01-03 DIAGNOSIS — Y33XXXA Other specified events, undetermined intent, initial encounter: Secondary | ICD-10-CM | POA: Insufficient documentation

## 2017-01-03 DIAGNOSIS — M79609 Pain in unspecified limb: Secondary | ICD-10-CM | POA: Diagnosis not present

## 2017-01-03 DIAGNOSIS — Z85850 Personal history of malignant neoplasm of thyroid: Secondary | ICD-10-CM | POA: Diagnosis not present

## 2017-01-03 DIAGNOSIS — M7989 Other specified soft tissue disorders: Secondary | ICD-10-CM

## 2017-01-03 DIAGNOSIS — S5412XA Injury of median nerve at forearm level, left arm, initial encounter: Secondary | ICD-10-CM

## 2017-01-03 LAB — I-STAT BETA HCG BLOOD, ED (MC, WL, AP ONLY): I-stat hCG, quantitative: <5 m[IU]/mL (ref <5)

## 2017-01-03 MED ORDER — FEXOFENADINE HCL 180 MG PO TABS: 180.0000 mg | ORAL_TABLET | Freq: Every day | ORAL | 0 refills | Status: DC

## 2017-01-03 MED ORDER — NAPROXEN 375 MG PO TABS: 375.0000 mg | ORAL_TABLET | Freq: Two times a day (BID) | ORAL | 0 refills | Status: AC

## 2017-01-03 MED ORDER — DIPHENHYDRAMINE HCL 25 MG PO CAPS
50.0000 mg | ORAL_CAPSULE | Freq: Once | ORAL | Status: AC
  Administered 2017-01-03: 50 mg via ORAL
  Filled 2017-01-03: qty 2

## 2017-01-03 MED ORDER — DEXAMETHASONE 4 MG PO TABS
10.0000 mg | ORAL_TABLET | Freq: Once | ORAL | Status: AC
  Administered 2017-01-03: 12:00:00 10 mg via ORAL
  Filled 2017-01-03: qty 2

## 2017-01-03 MED ORDER — CEPHALEXIN 500 MG PO CAPS: 500.0000 mg | ORAL_CAPSULE | Freq: Three times a day (TID) | ORAL | 0 refills | Status: AC

## 2017-01-03 MED ORDER — NAPHAZOLINE-PHENIRAMINE 0.025-0.3 % OP SOLN
1.0000 [drp] | Freq: Four times a day (QID) | OPHTHALMIC | Status: DC | PRN
  Administered 2017-01-03: 1 [drp] via OPHTHALMIC
  Filled 2017-01-03 (×2): qty 5

## 2017-01-03 MED ORDER — HYDROCODONE-ACETAMINOPHEN 5-325 MG PO TABS
2.0000 | ORAL_TABLET | Freq: Once | ORAL | Status: AC
  Administered 2017-01-03: 2 via ORAL
  Filled 2017-01-03: qty 2

## 2017-01-03 NOTE — ED Notes (Signed)
Our vascular tech. Has just arrived to perform her upper extremity study.

## 2017-01-03 NOTE — ED Triage Notes (Signed)
Pt had blood drawn from left arm on Thursday.  Pt felt sharp pain down left arm. Pt states pain remains in arm.

## 2017-01-03 NOTE — ED Provider Notes (Signed)
[facility] DEPT Provider Note   CSN: [id number] Arrival date & time: 01/03/17  0840     History   Chief Complaint Chief Complaint  Patient presents with  . Arm Pain    HPI [name] is a 31 y.o. female.  HPI  31 year old female with remote history of thyroid cancer status post surgical removal and radiation here with left arm pain. The patient states that she had labs drawn last week to determine if she was pregnant. She had a straight stick in the left before meals. During the stay, she reports sharp, stabbing, left-sided arm pain that shot down her arm along the radial aspect to her thumb. Since then, she has had persistent pain as well as swelling in the arm. She notices intermittent sharp pains that shoot from her elbow down to her thumb. She denies any associated weakness. Of note, she also noticed bilateral eye tearing, left greater than right, although this began after she was outside for some time after the steak and she feels it may be secondary to allergies. She does have some mild rhinorrhea as well. No history of metastatic thyroid cancer. She denies any history of DVT or blood clots. No family history of DVT. She currently has a mild, throbbing pain localized to the left antecubital fossa. The pain is sharp and worsens with any movement or flexion at the elbow. Pain also worsens with palpation. She has not tried anything for this.  Past Medical History:  Diagnosis Date  . Anxiety   . Cancer ([place]) 02/2012   thyroid cancer  . Thyroid disease    cancer    Patient Active Problem List   Diagnosis Date Noted  . Abortion, missed 02/07/2015    Past Surgical History:  Procedure Laterality Date  . CESAREAN SECTION  2006  . DILATION AND EVACUATION N/A 02/07/2015   Procedure: DILATATION AND EVACUATION;  Surgeon: Lavonia Drafts, MD;  Location: [facility];  Service: Gynecology;  Laterality: N/A;  . THYROIDECTOMY  02/2012   total L side first and R side 7 days  later.    OB History    Gravida Para Term Preterm AB Living   2 1 1     1    SAB TAB Ectopic Multiple Live Births         1         Home Medications    Prior to Admission medications   Medication Sig Start Date End Date Taking? Authorizing Provider  cephALEXin (KEFLEX) 500 MG capsule Take 1 capsule (500 mg total) by mouth 3 (three) times daily. 01/03/17 01/10/17  Duffy Bruce, MD  fexofenadine (ALLEGRA) 180 MG tablet Take 1 tablet (180 mg total) by mouth daily. 01/03/17 01/13/17  Duffy Bruce, MD  levothyroxine (SYNTHROID, LEVOTHROID) 200 MCG tablet Take 1 tablet (200 mcg total) by mouth daily before breakfast. 09/02/16 12/30/16  Bettey Costa, PA  naproxen (NAPROSYN) 375 MG tablet Take 1 tablet (375 mg total) by mouth 2 (two) times daily with a meal. 01/03/17 01/10/17  Duffy Bruce, MD  ondansetron (ZOFRAN) 4 MG tablet Take 1 tablet (4 mg total) by mouth every 6 (six) hours. 12/30/16   Luvenia Redden, PA-C    Family History Family History  Problem Relation Age of Onset  . Hypertension Mother   . Blindness Mother        one eye    Social History Social History  Substance Use Topics  . Smoking status: Never Smoker  . Smokeless tobacco:  Never Used  . Alcohol use No     Allergies   Patient has no known allergies.   Review of Systems Review of Systems  Constitutional: Negative for fatigue and fever.  HENT: Positive for rhinorrhea.   Eyes: Positive for discharge and itching.  Musculoskeletal: Positive for arthralgias.  Neurological: Positive for numbness.  All other systems reviewed and are negative.    Physical Exam Updated Vital Signs BP 135/87 (BP Location: Left Arm)   Pulse 85   Temp 98 F (36.7 C) (Oral)   Resp 18   SpO2 100%   Physical Exam  Constitutional: She is oriented to person, place, and time. She appears well-developed and well-nourished. No distress.  HENT:  Head: Normocephalic and atraumatic.  Eyes: Conjunctivae are normal.    Profuse clear discharge bilaterally with mild conjunctival injection. Pupils are equal. Extraocular movements intact. No purulence. No tenderness or erythema of lacrimal duct or periocular area.  Neck: Neck supple.  Cardiovascular: Normal rate, regular rhythm and normal heart sounds.  Exam reveals no friction rub.   No murmur heard. Pulmonary/Chest: Effort normal and breath sounds normal. No respiratory distress. She has no wheezes. She has no rales.  Abdominal: She exhibits no distension.  Musculoskeletal: She exhibits no edema.  Neurological: She is alert and oriented to person, place, and time. She exhibits normal muscle tone.  Skin: Skin is warm. Capillary refill takes less than 2 seconds. No rash noted.  Psychiatric: She has a normal mood and affect.  Nursing note and vitals reviewed.   UPPER EXTREMITY EXAM: LEFT  INSPECTION & PALPATION: Moderate TTP over anterior, radial forearm. No erythema or warmth. No fluctuance. Minimal swelling compared to contralateral side.  SENSORY: Sensation is intact to light touch in:  Superficial radial nerve distribution (dorsal first web space) Median nerve distribution (tip of index finger)   Ulnar nerve distribution (tip of small finger)     MOTOR:  + Motor posterior interosseous nerve (thumb IP extension) + Anterior interosseous nerve (thumb IP flexion, index finger DIP flexion) + Radial nerve (wrist extension) + Median nerve (palpable firing thenar mass) + Ulnar nerve (palpable firing of first dorsal interosseous muscle)  VASCULAR: 2+ radial pulse Brisk capillary refill < 2 sec, fingers warm and well-perfused  COMPARTMENTS: Soft, warm, well-perfused No pain with passive extension No paresthesias    ED Treatments / Results  Labs (all labs ordered are listed, but only abnormal results are displayed) Labs Reviewed  I-STAT BETA HCG BLOOD, ED ([facility], [facility], [facility] ONLY)    EKG  EKG Interpretation None       Radiology Dg Chest 2  View  Result Date: 01/03/2017 CLINICAL DATA:  Chest and left arm pain EXAM: CHEST  2 VIEW COMPARISON:  07/22/2016 FINDINGS: The heart size and mediastinal contours are within normal limits. Both lungs are clear. The visualized skeletal structures are unremarkable. IMPRESSION: No active cardiopulmonary disease. Electronically Signed   By: Jerilynn Mages.  Shick M.D.   On: 01/03/2017 10:06    Procedures Procedures (including critical care time)  Medications Ordered in ED Medications  diphenhydrAMINE (BENADRYL) capsule 50 mg (50 mg Oral Given 01/03/17 0956)  HYDROcodone-acetaminophen (NORCO/VICODIN) 5-325 MG per tablet 2 tablet (2 tablets Oral Given 01/03/17 1130)  dexamethasone (DECADRON) tablet 10 mg (10 mg Oral Given 01/03/17 1130)     Initial Impression / Assessment and Plan / ED Course  I have reviewed the triage vital signs and the nursing notes.  Pertinent labs & imaging results that were available during  my care of the patient were reviewed by me and considered in my medical decision making (see chart for details).     31 year old female with past medical history as above here with mild left arm pain and swelling after IV stick several days ago. On exam, she is neurovascularly intact. Ultrasound shows no evidence of DVT and clinically there is no evidence of significant cellulitis. I suspect she has a mild, transient neuropraxia secondary to possible nerve injury during IV stick. This should improve with time. Otherwise, no apparent emergent pathology. Will place her on NSAIDs as well as give prophylactic antibiotics given degree of swelling and pain. Otherwise, she does not appear septic and there is no signs of septic thrombophlebitis. Regarding her eye watering, this is consistent with allergic conjunctivitis and this resolved with antihistamines here. Will place her on scheduled antihistamine and discharge home.  Final Clinical Impressions(s) / ED Diagnoses   Final diagnoses:  Neurapraxia of left  median nerve, initial encounter  Arm swelling  Allergic conjunctivitis of both eyes    New Prescriptions Discharge Medication List as of 01/03/2017 11:12 AM    START taking these medications   Details  cephALEXin (KEFLEX) 500 MG capsule Take 1 capsule (500 mg total) by mouth 3 (three) times daily., Starting Sat 01/03/2017, Until Sat 01/10/2017, Print    fexofenadine (ALLEGRA) 180 MG tablet Take 1 tablet (180 mg total) by mouth daily., Starting Sat 01/03/2017, Until Tue 01/13/2017, Print    naproxen (NAPROSYN) 375 MG tablet Take 1 tablet (375 mg total) by mouth 2 (two) times daily with a meal., Starting Sat 01/03/2017, Until Sat 01/10/2017, Print         Duffy Bruce, MD 01/04/17 1138

## 2017-01-03 NOTE — Progress Notes (Signed)
*  PRELIMINARY RESULTS* Vascular Ultrasound Left upper extremity venous duplex has been completed.  Preliminary findings: No evidence of DVT or superficial thrombosis.   Landry Mellow, RDMS, RVT  01/03/2017, 10:52 AM

## 2017-06-02 ENCOUNTER — Ambulatory Visit (HOSPITAL_COMMUNITY)
Admission: EM | Admit: 2017-06-02 | Discharge: 2017-06-02 | Disposition: A | Discharge status: Home / Self Care | Payer: Medicaid Other | Attending: Family Medicine | Admitting: Family Medicine

## 2017-06-02 ENCOUNTER — Encounter (HOSPITAL_COMMUNITY): Payer: Self-pay | Admitting: Emergency Medicine

## 2017-06-02 DIAGNOSIS — K1379 Other lesions of oral mucosa: Secondary | ICD-10-CM

## 2017-06-02 MED ORDER — FLUORESCEIN SODIUM 0.6 MG OP STRP
ORAL_STRIP | OPHTHALMIC | Status: AC
  Filled 2017-06-02: qty 1

## 2017-06-02 MED ORDER — TETRACAINE HCL 0.5 % OP SOLN
OPHTHALMIC | Status: AC
  Filled 2017-06-02: qty 4

## 2017-06-02 NOTE — ED Provider Notes (Signed)
[place]    CSN: [id number] Arrival date & time: 06/02/17  1713     History   Chief Complaint Chief Complaint  Patient presents with  . Mouth Lesions    HPI [name] is a 31 y.o. female.   31 year-old female, presenting today complaining of blood blister to the inner right cheek. She noticed this area today. She is unsure whether or not she had a trauma to this area.    The history is provided by the patient.  Mouth Lesions  Location:  Buccal mucosa Buccal mucosa location:  R buccal mucosa Quality:  Blistered Onset quality:  Gradual Severity:  Mild Duration:  1 day Progression:  Unchanged Chronicity:  New Context: not a change in diet, not a change in medications, not medications, not a possible infection, not stress and not trauma   Relieved by:  Nothing Worsened by:  Nothing Ineffective treatments:  None tried Associated symptoms: no congestion, no dental pain, no ear pain, no fever, no malaise, no neck pain, no rash, no rhinorrhea, no sore throat and no swollen glands     Past Medical History:  Diagnosis Date  . Anxiety   . Cancer ([place]) 02/2012   thyroid cancer  . Thyroid disease    cancer    Patient Active Problem List   Diagnosis Date Noted  . Abortion, missed 02/07/2015    Past Surgical History:  Procedure Laterality Date  . CESAREAN SECTION  2006  . DILATION AND EVACUATION N/A 02/07/2015   Procedure: DILATATION AND EVACUATION;  Surgeon: Lavonia Drafts, MD;  Location: [facility];  Service: Gynecology;  Laterality: N/A;  . THYROIDECTOMY  02/2012   total L side first and R side 7 days later.    OB History    Gravida Para Term Preterm AB Living   2 1 1     1    SAB TAB Ectopic Multiple Live Births         1         Home Medications    Prior to Admission medications   Medication Sig Start Date End Date Taking? Authorizing Provider  fexofenadine (ALLEGRA) 180 MG tablet Take 1 tablet (180 mg total) by mouth daily.  01/03/17 01/13/17  Duffy Bruce, MD  levothyroxine (SYNTHROID, LEVOTHROID) 200 MCG tablet Take 1 tablet (200 mcg total) by mouth daily before breakfast. 09/02/16 12/30/16  Espina, Kemper Durie, PA  ondansetron (ZOFRAN) 4 MG tablet Take 1 tablet (4 mg total) by mouth every 6 (six) hours. 12/30/16   Luvenia Redden, PA-C    Family History Family History  Problem Relation Age of Onset  . Hypertension Mother   . Blindness Mother        one eye    Social History Social History  Substance Use Topics  . Smoking status: Never Smoker  . Smokeless tobacco: Never Used  . Alcohol use No     Allergies   Patient has no known allergies.   Review of Systems Review of Systems  Constitutional: Negative for chills and fever.  HENT: Positive for mouth sores. Negative for congestion, ear pain, rhinorrhea and sore throat.   Eyes: Negative for pain and visual disturbance.  Respiratory: Negative for cough and shortness of breath.   Cardiovascular: Negative for chest pain and palpitations.  Gastrointestinal: Negative for abdominal pain and vomiting.  Genitourinary: Negative for dysuria and hematuria.  Musculoskeletal: Negative for arthralgias, back pain and neck pain.  Skin: Negative for color change and  rash.  Neurological: Negative for seizures and syncope.  All other systems reviewed and are negative.    Physical Exam Triage Vital Signs ED Triage Vitals [06/02/17 1730]  Enc Vitals Group     BP (!) 168/110     Pulse Rate 90     Resp (!) 22     Temp 98.3 F (36.8 C)     Temp Source Oral     SpO2 99 %     Weight      Height      Head Circumference      Peak Flow      Pain Score      Pain Loc      Pain Edu?      Excl. in [place]?    No data found.   Updated Vital Signs BP (!) 168/110 (BP Location: Left Arm)   Pulse 90   Temp 98.3 F (36.8 C) (Oral)   Resp (!) 22   SpO2 99%   Visual Acuity Right Eye Distance:   Left Eye Distance:   Bilateral Distance:    Right Eye  Near:   Left Eye Near:    Bilateral Near:     Physical Exam  Constitutional: She appears well-developed and well-nourished. No distress.  HENT:  Head: Normocephalic and atraumatic.  Mouth/Throat: Uvula is midline. Oral lesions present.  Patient has a small area to the buccal mucosa of the right inner cheek consistent with trauma with small amount of blood noted. No active bleeding  Eyes: Conjunctivae are normal.  Neck: Neck supple.  Cardiovascular: Normal rate and regular rhythm.   No murmur heard. Pulmonary/Chest: Effort normal and breath sounds normal. No respiratory distress.  Abdominal: Soft. There is no tenderness.  Musculoskeletal: She exhibits no edema.  Neurological: She is alert.  Skin: Skin is warm and dry.  Psychiatric: She has a normal mood and affect.  Nursing note and vitals reviewed.    UC Treatments / Results  Labs (all labs ordered are listed, but only abnormal results are displayed) Labs Reviewed - No data to display  EKG  EKG Interpretation None       Radiology No results found.  Procedures Procedures (including critical care time)  Medications Ordered in UC Medications - No data to display   Initial Impression / Assessment and Plan / UC Course  I have reviewed the triage vital signs and the nursing notes.  Pertinent labs & imaging results that were available during my care of the patient were reviewed by me and considered in my medical decision making (see chart for details).   injury to the right cheek buccal mucosa. Will treat symptomatically with nsaids and ice     Final Clinical Impressions(s) / UC Diagnoses   Final diagnoses:  Mouth sore    New Prescriptions New Prescriptions   No medications on file     Controlled Substance Prescriptions  Controlled Substance Registry consulted? Not Applicable   Phebe Colla, [place] 06/02/17 1751

## 2017-06-02 NOTE — ED Triage Notes (Signed)
Blood blister inside mouth on right side.  Noticed prior to coming here today.  No pain

## 2017-06-16 ENCOUNTER — Encounter (HOSPITAL_COMMUNITY): Payer: Self-pay | Admitting: Emergency Medicine

## 2017-06-16 ENCOUNTER — Emergency Department (HOSPITAL_COMMUNITY)
Admission: EM | Admit: 2017-06-16 | Discharge: 2017-06-16 | Disposition: A | Discharge status: Home / Self Care | Payer: Medicaid Other | Attending: Emergency Medicine | Admitting: Emergency Medicine

## 2017-06-16 ENCOUNTER — Emergency Department (HOSPITAL_COMMUNITY): Payer: Medicaid Other

## 2017-06-16 DIAGNOSIS — Z5321 Procedure and treatment not carried out due to patient leaving prior to being seen by health care provider: Secondary | ICD-10-CM | POA: Diagnosis not present

## 2017-06-16 DIAGNOSIS — I10 Essential (primary) hypertension: Secondary | ICD-10-CM

## 2017-06-16 DIAGNOSIS — H532 Diplopia: Secondary | ICD-10-CM | POA: Diagnosis not present

## 2017-06-16 DIAGNOSIS — R51 Headache: Secondary | ICD-10-CM | POA: Diagnosis not present

## 2017-06-16 DIAGNOSIS — R519 Headache, unspecified: Secondary | ICD-10-CM

## 2017-06-16 LAB — BASIC METABOLIC PANEL
Anion gap: 8 (ref 5–15)
BUN: 19 mg/dL (ref 6–20)
CALCIUM: 9.3 mg/dL (ref 8.9–10.3)
CHLORIDE: 105 mmol/L (ref 101–111)
CO2: 26 mmol/L (ref 22–32)
Creatinine, Ser: 0.97 mg/dL (ref 0.44–1.00)
GFR calc Af Amer: >60 mL/min (ref >60)
GFR calc non Af Amer: >60 mL/min (ref >60)
Glucose, Bld: 108 mg/dL — ABNORMAL HIGH (ref 65–99)
Potassium: 3.9 mmol/L (ref 3.5–5.1)
SODIUM: 139 mmol/L (ref 135–145)

## 2017-06-16 LAB — I-STAT BETA HCG BLOOD, ED (MC, WL, AP ONLY)

## 2017-06-16 LAB — I-STAT TROPONIN, ED: TROPONIN I, POC: 0 ng/mL (ref 0.00–0.08)

## 2017-06-16 LAB — CBC
HCT: 39.3 % (ref 36.0–46.0)
Hemoglobin: 13.5 g/dL (ref 12.0–15.0)
MCH: 31 pg (ref 26.0–34.0)
MCHC: 34.4 g/dL (ref 30.0–36.0)
MCV: 90.3 fL (ref 78.0–100.0)
PLATELETS: 254 10*3/uL (ref 150–400)
RBC: 4.35 MIL/uL (ref 3.87–5.11)
RDW: 13.4 % (ref 11.5–15.5)
WBC: 12.1 10*3/uL — AB (ref 4.0–10.5)

## 2017-06-16 MED ORDER — HYDRALAZINE HCL 20 MG/ML IJ SOLN
5.0000 mg | Freq: Once | INTRAMUSCULAR | Status: AC
  Administered 2017-06-16: 5 mg via INTRAVENOUS
  Filled 2017-06-16: qty 1

## 2017-06-16 MED ORDER — ONDANSETRON HCL 4 MG/2ML IJ SOLN
4.0000 mg | Freq: Once | INTRAMUSCULAR | Status: AC
  Administered 2017-06-16: 4 mg via INTRAVENOUS
  Filled 2017-06-16: qty 2

## 2017-06-16 MED ORDER — HYDROMORPHONE HCL 1 MG/ML IJ SOLN: 1.0000 mg | Freq: Once | INTRAMUSCULAR | Status: DC

## 2017-06-16 NOTE — ED Notes (Signed)
Pt wanting to leave, made MD and PA aware.

## 2017-06-16 NOTE — ED Triage Notes (Addendum)
Patient reports left sided headache with blurred vision that started around 3pm today while at work. Patient reports going home and then started having central chest pain and "thumping" in her chest.  Patient reports PMh HTN when pregnant. Patient reports has sensitivity to light.

## 2017-06-17 ENCOUNTER — Emergency Department (HOSPITAL_COMMUNITY)
Admission: EM | Admit: 2017-06-17 | Discharge: 2017-06-17 | Disposition: A | Discharge status: Home / Self Care | Payer: Medicaid Other | Attending: Emergency Medicine | Admitting: Emergency Medicine

## 2017-06-17 ENCOUNTER — Other Ambulatory Visit: Payer: Self-pay

## 2017-06-17 ENCOUNTER — Encounter (HOSPITAL_COMMUNITY): Payer: Self-pay | Admitting: Emergency Medicine

## 2017-06-17 ENCOUNTER — Emergency Department (HOSPITAL_COMMUNITY): Payer: Medicaid Other

## 2017-06-17 DIAGNOSIS — I10 Essential (primary) hypertension: Secondary | ICD-10-CM | POA: Insufficient documentation

## 2017-06-17 DIAGNOSIS — R55 Syncope and collapse: Secondary | ICD-10-CM | POA: Diagnosis present

## 2017-06-17 DIAGNOSIS — Z85850 Personal history of malignant neoplasm of thyroid: Secondary | ICD-10-CM | POA: Insufficient documentation

## 2017-06-17 DIAGNOSIS — Z79899 Other long term (current) drug therapy: Secondary | ICD-10-CM | POA: Diagnosis not present

## 2017-06-17 LAB — URINALYSIS, ROUTINE W REFLEX MICROSCOPIC
BACTERIA UA: NONE SEEN
BILIRUBIN URINE: NEGATIVE
Glucose, UA: NEGATIVE mg/dL
Ketones, ur: NEGATIVE mg/dL
LEUKOCYTES UA: NEGATIVE
NITRITE: NEGATIVE
PROTEIN: NEGATIVE mg/dL
SPECIFIC GRAVITY, URINE: 1.013 (ref 1.005–1.030)
pH: 7 (ref 5.0–8.0)

## 2017-06-17 LAB — BASIC METABOLIC PANEL
ANION GAP: 8 (ref 5–15)
BUN: 13 mg/dL (ref 6–20)
CO2: 24 mmol/L (ref 22–32)
Calcium: 9.2 mg/dL (ref 8.9–10.3)
Chloride: 106 mmol/L (ref 101–111)
Creatinine, Ser: 1.05 mg/dL — ABNORMAL HIGH (ref 0.44–1.00)
GFR calc Af Amer: >60 mL/min (ref >60)
Glucose, Bld: 148 mg/dL — ABNORMAL HIGH (ref 65–99)
POTASSIUM: 3.6 mmol/L (ref 3.5–5.1)
SODIUM: 138 mmol/L (ref 135–145)

## 2017-06-17 LAB — CBC
HEMATOCRIT: 38.6 % (ref 36.0–46.0)
HEMOGLOBIN: 12.9 g/dL (ref 12.0–15.0)
MCH: 30.6 pg (ref 26.0–34.0)
MCHC: 33.4 g/dL (ref 30.0–36.0)
MCV: 91.5 fL (ref 78.0–100.0)
Platelets: 222 10*3/uL (ref 150–400)
RBC: 4.22 MIL/uL (ref 3.87–5.11)
RDW: 13.7 % (ref 11.5–15.5)
WBC: 8.3 10*3/uL (ref 4.0–10.5)

## 2017-06-17 LAB — POC URINE PREG, ED: PREG TEST UR: NEGATIVE

## 2017-06-17 LAB — CBG MONITORING, ED: Glucose-Capillary: 141 mg/dL — ABNORMAL HIGH (ref 65–99)

## 2017-06-17 MED ORDER — AMLODIPINE BESYLATE 5 MG PO TABS: 5.0000 mg | ORAL_TABLET | Freq: Every day | ORAL | 1 refills | Status: DC

## 2017-06-17 MED ORDER — ONDANSETRON HCL 4 MG/2ML IJ SOLN
4.0000 mg | Freq: Once | INTRAMUSCULAR | Status: AC
  Administered 2017-06-17: 4 mg via INTRAVENOUS
  Filled 2017-06-17: qty 2

## 2017-06-17 MED ORDER — SODIUM CHLORIDE 0.9 % IV BOLUS (SEPSIS)
1000.0000 mL | Freq: Once | INTRAVENOUS | Status: AC
  Administered 2017-06-17: 1000 mL via INTRAVENOUS

## 2017-06-17 MED ORDER — FENTANYL CITRATE (PF) 100 MCG/2ML IJ SOLN
50.0000 ug | Freq: Once | INTRAMUSCULAR | Status: AC
  Administered 2017-06-17: 50 ug via INTRAVENOUS
  Filled 2017-06-17: qty 2

## 2017-06-17 NOTE — ED Notes (Signed)
CBG - 141 ° °

## 2017-06-17 NOTE — ED Provider Notes (Signed)
[facility] EMERGENCY DEPARTMENT Provider Note   CSN: [id number] Arrival date & time: 06/17/17  4235     History   Chief Complaint Chief Complaint  Patient presents with  . Loss of Consciousness  . Headache    HPI [name] is a 31 y.o. female.  Patient became overheated at work and allegedly "passed out".  She blames this on being overheated.  She has a history of hypertension, but no specific treatment.  Past medical history includes thyroid cancer and subsequent thyroidectomy.  She is currently on Synthroid 200 mcg a day.  Review of systems positive for headache and nausea.  No chest pain, dyspnea, dysuria, fever, sweats, chills, neuro deficits.  EMS report of blood pressure 172/107.  She was seen last night at [facility] with a headache and high blood pressure.  Severity of symptoms is moderate.      Past Medical History:  Diagnosis Date  . Anxiety   . Cancer ([place]) 02/2012   thyroid cancer  . Thyroid disease    cancer    Patient Active Problem List   Diagnosis Date Noted  . Abortion, missed 02/07/2015    Past Surgical History:  Procedure Laterality Date  . CESAREAN SECTION  2006  . THYROIDECTOMY  02/2012   total L side first and R side 7 days later.    OB History    Gravida Para Term Preterm AB Living   2 1 1     1    SAB TAB Ectopic Multiple Live Births         1         Home Medications    Prior to Admission medications   Medication Sig Start Date End Date Taking? Authorizing Provider  levothyroxine (SYNTHROID, LEVOTHROID) 200 MCG tablet Take 200 mcg daily before breakfast by mouth.   Yes [provider]  amLODipine (NORVASC) 5 MG tablet Take 1 tablet (5 mg total) daily by mouth. 06/17/17   Nat Christen, MD  fexofenadine (ALLEGRA) 180 MG tablet Take 1 tablet (180 mg total) by mouth daily. 01/03/17 01/13/17  Duffy Bruce, MD  levothyroxine (SYNTHROID, LEVOTHROID) 200 MCG tablet Take 1 tablet (200 mcg total) by  mouth daily before breakfast. 09/02/16 12/30/16  Espina, Kemper Durie, PA  ondansetron (ZOFRAN) 4 MG tablet Take 1 tablet (4 mg total) by mouth every 6 (six) hours. Patient not taking: Reported on 06/16/2017 12/30/16   Luvenia Redden, PA-C    Family History Family History  Problem Relation Age of Onset  . Hypertension Mother   . Blindness Mother        one eye    Social History Social History   Tobacco Use  . Smoking status: Never Smoker  . Smokeless tobacco: Never Used  Substance Use Topics  . Alcohol use: No  . Drug use: No     Allergies   Patient has no known allergies.   Review of Systems Review of Systems  All other systems reviewed and are negative.    Physical Exam Updated Vital Signs BP (!) 161/98   Pulse 80   Resp 16   Ht 5\' 2"  (1.575 m)   Wt 122.5 kg (270 lb)   LMP 06/11/2017   SpO2 100%   BMI 49.38 kg/m   Physical Exam  Constitutional: She is oriented to person, place, and time. She appears well-developed and well-nourished.  Hypertensive.  HENT:  Head: Normocephalic and atraumatic.  Eyes: Conjunctivae are normal.  Neck: Neck  supple.  Cardiovascular: Normal rate and regular rhythm.  Pulmonary/Chest: Effort normal and breath sounds normal.  Abdominal: Soft. Bowel sounds are normal.  Musculoskeletal: Normal range of motion.  Neurological: She is alert and oriented to person, place, and time.  Skin: Skin is warm and dry.  Psychiatric: She has a normal mood and affect. Her behavior is normal.  Nursing note and vitals reviewed.    ED Treatments / Results  Labs (all labs ordered are listed, but only abnormal results are displayed) Labs Reviewed  BASIC METABOLIC PANEL - Abnormal; Notable for the following components:      Result Value   Glucose, Bld 148 (*)    Creatinine, Ser 1.05 (*)    All other components within normal limits  URINALYSIS, ROUTINE W REFLEX MICROSCOPIC - Abnormal; Notable for the following components:   Color, Urine  STRAW (*)    Hgb urine dipstick SMALL (*)    Squamous Epithelial / LPF 0-5 (*)    All other components within normal limits  CBG MONITORING, ED - Abnormal; Notable for the following components:   Glucose-Capillary 141 (*)    All other components within normal limits  CBC  CBG MONITORING, ED  POC URINE PREG, ED    EKG  EKG Interpretation  Date/Time:  Wednesday June 17 2017 09:13:37 EST Ventricular Rate:  90 PR Interval:    QRS Duration: 92 QT Interval:  359 QTC Calculation: 440 R Axis:   17 Text Interpretation:  Sinus rhythm Borderline abnrm T, anterolateral leads Baseline wander in lead(s) III aVF Confirmed by Nat Christen [phone]) on 06/17/2017 10:21:21 AM       Radiology Dg Chest 2 View  Result Date: 06/16/2017 CLINICAL DATA:  Left-sided headache with chest pain EXAM: CHEST  2 VIEW COMPARISON:  01/03/2017 FINDINGS: The heart size and mediastinal contours are within normal limits. Both lungs are clear. The visualized skeletal structures are unremarkable. IMPRESSION: No active cardiopulmonary disease. Electronically Signed   By: Donavan Foil M.D.   On: 06/16/2017 18:41   Ct Head Wo Contrast  Result Date: 06/17/2017 CLINICAL DATA:  Frontal headache and nausea since last night. No reported injury. History of thyroid cancer. EXAM: CT HEAD WITHOUT CONTRAST TECHNIQUE: Contiguous axial images were obtained from the base of the skull through the vertex without intravenous contrast. COMPARISON:  06/16/2017 head CT. FINDINGS: Brain: No evidence of parenchymal hemorrhage or extra-axial fluid collection. No mass lesion, mass effect, or midline shift. No CT evidence of acute infarction. Cerebral volume is age appropriate. No ventriculomegaly. Vascular: No acute abnormality. Skull: No evidence of calvarial fracture. Sinuses/Orbits: Partial opacification of the visualized maxillary sinuses bilaterally. No appreciable fluid levels. Other:  The mastoid air cells are unopacified. IMPRESSION: 1.   No evidence of acute intracranial abnormality. 2. Paranasal sinusitis, probably chronic. Electronically Signed   By: Ilona Sorrel M.D.   On: 06/17/2017 11:59   Ct Head Wo Contrast  Result Date: 06/16/2017 CLINICAL DATA:  Left-sided headache and blurred vision beginning 5 hours ago. EXAM: CT HEAD WITHOUT CONTRAST TECHNIQUE: Contiguous axial images were obtained from the base of the skull through the vertex without intravenous contrast. COMPARISON:  10/31/2016 FINDINGS: Brain: No evidence of malformation, atrophy, old or acute small or large vessel infarction, mass lesion, hemorrhage, hydrocephalus or extra-axial collection. No evidence of pituitary lesion. Vascular: No vascular calcification.  No hyperdense vessels. Skull: Normal.  No fracture or focal bone lesion. Sinuses/Orbits: Visualized sinuses are clear. No fluid in the middle ears or mastoids. Visualized  orbits are normal. Other: None significant IMPRESSION: Normal head CT Electronically Signed   By: Nelson Chimes M.D.   On: 06/16/2017 20:16    Procedures Procedures (including critical care time)  Medications Ordered in ED Medications  sodium chloride 0.9 % bolus 1,000 mL (0 mLs Intravenous Stopped 06/17/17 1353)  fentaNYL (SUBLIMAZE) injection 50 mcg (50 mcg Intravenous Given 06/17/17 1047)  sodium chloride 0.9 % bolus 1,000 mL (0 mLs Intravenous Stopped 06/17/17 1411)  ondansetron (ZOFRAN) injection 4 mg (4 mg Intravenous Given 06/17/17 1222)     Initial Impression / Assessment and Plan / ED Course  I have reviewed the triage vital signs and the nursing notes.  Pertinent labs & imaging results that were available during my care of the patient were reviewed by me and considered in my medical decision making (see chart for details).     Patient presents with syncopal spell at work.  Additionally, she complains of headache and nausea.  Repeat head CT scan showed no acute findings.  She feels better after IV fluids and IV Zofran.  No  neurological deficit at discharge.  Will start Norvasc 5 mg daily.  She is encouraged to get primary care follow-up.  Final Clinical Impressions(s) / ED Diagnoses   Final diagnoses:  Syncope, unspecified syncope type  Hypertension, unspecified type    ED Discharge Orders        Ordered    amLODipine (NORVASC) 5 MG tablet  Daily     06/17/17 1508       Nat Christen, MD 06/17/17 1516

## 2017-06-17 NOTE — Discharge Instructions (Signed)
Make sure you eat and drink in the morning.  We will start a blood pressure medication.  You will need primary care follow-up.

## 2017-06-17 NOTE — ED Triage Notes (Signed)
Pt in from work via [employer] [facility] with c/o HA and syncope. Pt states she was at work, became very hot, stepped outside and passed out, fell to carpet. Denies any head, neck pain. Pt was seen at [facility] last night for cp and dizziness. Hypertensive for EMS - 172/107. Hx of thyroidectomy s/p CA. A&ox4, denies cp, sob or n/v

## 2017-09-27 ENCOUNTER — Other Ambulatory Visit: Payer: Self-pay

## 2017-09-27 ENCOUNTER — Encounter (HOSPITAL_COMMUNITY): Payer: Self-pay | Admitting: Emergency Medicine

## 2017-09-27 ENCOUNTER — Emergency Department (HOSPITAL_COMMUNITY): Payer: Medicaid Other

## 2017-09-27 ENCOUNTER — Emergency Department (HOSPITAL_COMMUNITY)
Admission: EM | Admit: 2017-09-27 | Discharge: 2017-09-28 | Disposition: A | Discharge status: Home / Self Care | Payer: Medicaid Other | Attending: Emergency Medicine | Admitting: Emergency Medicine

## 2017-09-27 DIAGNOSIS — K5792 Diverticulitis of intestine, part unspecified, without perforation or abscess without bleeding: Secondary | ICD-10-CM | POA: Diagnosis not present

## 2017-09-27 DIAGNOSIS — R109 Unspecified abdominal pain: Secondary | ICD-10-CM | POA: Diagnosis present

## 2017-09-27 LAB — I-STAT BETA HCG BLOOD, ED (MC, WL, AP ONLY): I-stat hCG, quantitative: <5 m[IU]/mL (ref <5)

## 2017-09-27 LAB — BASIC METABOLIC PANEL
ANION GAP: 11 (ref 5–15)
BUN: 11 mg/dL (ref 6–20)
CO2: 22 mmol/L (ref 22–32)
Calcium: 8.5 mg/dL — ABNORMAL LOW (ref 8.9–10.3)
Chloride: 104 mmol/L (ref 101–111)
Creatinine, Ser: 0.69 mg/dL (ref 0.44–1.00)
GFR calc Af Amer: >60 mL/min (ref >60)
GFR calc non Af Amer: >60 mL/min (ref >60)
GLUCOSE: 142 mg/dL — AB (ref 65–99)
POTASSIUM: 4 mmol/L (ref 3.5–5.1)
Sodium: 137 mmol/L (ref 135–145)

## 2017-09-27 LAB — URINALYSIS, ROUTINE W REFLEX MICROSCOPIC
BILIRUBIN URINE: NEGATIVE
Glucose, UA: NEGATIVE mg/dL
KETONES UR: 5 mg/dL — AB
Leukocytes, UA: NEGATIVE
NITRITE: NEGATIVE
PH: 5 (ref 5.0–8.0)
Protein, ur: NEGATIVE mg/dL
SPECIFIC GRAVITY, URINE: 1.03 (ref 1.005–1.030)

## 2017-09-27 LAB — CBC
HEMATOCRIT: 38.6 % (ref 36.0–46.0)
HEMOGLOBIN: 13.1 g/dL (ref 12.0–15.0)
MCH: 30.6 pg (ref 26.0–34.0)
MCHC: 33.9 g/dL (ref 30.0–36.0)
MCV: 90.2 fL (ref 78.0–100.0)
Platelets: 273 10*3/uL (ref 150–400)
RBC: 4.28 MIL/uL (ref 3.87–5.11)
RDW: 12.5 % (ref 11.5–15.5)
WBC: 14.3 10*3/uL — ABNORMAL HIGH (ref 4.0–10.5)

## 2017-09-27 MED ORDER — ONDANSETRON HCL 4 MG/2ML IJ SOLN
4.0000 mg | Freq: Once | INTRAMUSCULAR | Status: AC
  Administered 2017-09-28: 4 mg via INTRAVENOUS
  Filled 2017-09-27: qty 2

## 2017-09-27 MED ORDER — MORPHINE SULFATE (PF) 4 MG/ML IV SOLN
4.0000 mg | Freq: Once | INTRAVENOUS | Status: AC
  Administered 2017-09-28: 4 mg via INTRAVENOUS
  Filled 2017-09-27: qty 1

## 2017-09-27 MED ORDER — SODIUM CHLORIDE 0.9 % IV BOLUS (SEPSIS)
1000.0000 mL | Freq: Once | INTRAVENOUS | Status: AC
  Administered 2017-09-28: 1000 mL via INTRAVENOUS

## 2017-09-27 MED ORDER — IOPAMIDOL (ISOVUE-300) INJECTION 61%
INTRAVENOUS | Status: AC
  Administered 2017-09-28: 100 mL
  Filled 2017-09-27: qty 100

## 2017-09-27 NOTE — ED Triage Notes (Signed)
Pt sent by primary care doctor after being seen for right side pain, Pt WBC were elevated per pt they called and told her to come to the ER.

## 2017-09-27 NOTE — ED Notes (Signed)
Pt states that she has a UTI and took 2 days of antibiotics but that they made her sick so she stopped taking them. She reports telling her primary care doctor but they did not give her new antibiotics

## 2017-09-27 NOTE — ED Provider Notes (Signed)
[facility] EMERGENCY DEPARTMENT Provider Note   CSN: [id number] Arrival date & time: 09/27/17  2012     History   Chief Complaint Chief Complaint  Patient presents with  . Abnormal Lab  . Flank Pain    HPI [name] is a 32 y.o. female.  Patient is a 32 year old female with history of thyroid cancer presenting for evaluation of right-sided abdominal pain.  This began 2 days ago and is worsening.  She was treated for a UTI last week and this seems to have improved.  She was seen by her primary doctor this morning and had blood drawn.  They called her this evening telling her that she had an elevated white count and she should go to the ER.  She denies any urinary complaints.  She denies any fever.  She denies any abnormal vaginal bleeding.  Her last menstrual period was in December, but it is normal for her to have abnormal and missed periods.  She denies the possibility of pregnancy.   The history is provided by the patient.  Abdominal Pain   This is a new problem. The problem occurs constantly. The problem has been gradually worsening. The pain is located in the RLQ. Pertinent negatives include fever, hematochezia, constipation, dysuria and frequency. The symptoms are aggravated by certain positions and palpation. Nothing relieves the symptoms.    Past Medical History:  Diagnosis Date  . Anxiety   . Cancer ([place]) 02/2012   thyroid cancer  . Thyroid disease    cancer    Patient Active Problem List   Diagnosis Date Noted  . Abortion, missed 02/07/2015    Past Surgical History:  Procedure Laterality Date  . CESAREAN SECTION  2006  . DILATION AND EVACUATION N/A 02/07/2015   Procedure: DILATATION AND EVACUATION;  Surgeon: Lavonia Drafts, MD;  Location: [facility];  Service: Gynecology;  Laterality: N/A;  . THYROIDECTOMY  02/2012   total L side first and R side 7 days later.    OB History    Gravida Para Term Preterm AB Living   2 1 1     1      SAB TAB Ectopic Multiple Live Births         1         Home Medications    Prior to Admission medications   Medication Sig Start Date End Date Taking? Authorizing Provider  amLODipine (NORVASC) 5 MG tablet Take 1 tablet (5 mg total) daily by mouth. 06/17/17   Nat Christen, MD  fexofenadine (ALLEGRA) 180 MG tablet Take 1 tablet (180 mg total) by mouth daily. 01/03/17 01/13/17  Duffy Bruce, MD  levothyroxine (SYNTHROID, LEVOTHROID) 200 MCG tablet Take 1 tablet (200 mcg total) by mouth daily before breakfast. 09/02/16 12/30/16  Espina, Kemper Durie, PA  levothyroxine (SYNTHROID, LEVOTHROID) 200 MCG tablet Take 200 mcg daily before breakfast by mouth.    [provider]  ondansetron (ZOFRAN) 4 MG tablet Take 1 tablet (4 mg total) by mouth every 6 (six) hours. Patient not taking: Reported on 06/16/2017 12/30/16   Luvenia Redden, PA-C    Family History Family History  Problem Relation Age of Onset  . Hypertension Mother   . Blindness Mother        one eye    Social History Social History   Tobacco Use  . Smoking status: Never Smoker  . Smokeless tobacco: Never Used  Substance Use Topics  . Alcohol use: No  .  Drug use: No     Allergies   Patient has no known allergies.   Review of Systems Review of Systems  Constitutional: Negative for fever.  Gastrointestinal: Positive for abdominal pain. Negative for constipation and hematochezia.  Genitourinary: Negative for dysuria and frequency.  All other systems reviewed and are negative.    Physical Exam Updated Vital Signs Pulse 98   Temp 98.2 F (36.8 C) (Oral)   Resp 16   Ht 5\' 2"  (1.575 m)   Wt 118.4 kg (261 lb)   LMP  (LMP Unknown)   SpO2 99%   BMI 47.74 kg/m   Physical Exam  Constitutional: She is oriented to person, place, and time. She appears well-developed and well-nourished. No distress.  HENT:  Head: Normocephalic and atraumatic.  Neck: Normal range of motion. Neck supple.   Cardiovascular: Normal rate and regular rhythm. Exam reveals no gallop and no friction rub.  No murmur heard. Pulmonary/Chest: Effort normal and breath sounds normal. No respiratory distress. She has no wheezes.  Abdominal: Soft. Bowel sounds are normal. She exhibits no distension. There is tenderness. There is no rebound and no guarding.  There is tenderness to palpation in the right lower quadrant and right flank.  Musculoskeletal: Normal range of motion.  Neurological: She is alert and oriented to person, place, and time.  Skin: Skin is warm and dry. She is not diaphoretic.  Nursing note and vitals reviewed.    ED Treatments / Results  Labs (all labs ordered are listed, but only abnormal results are displayed) Labs Reviewed  URINALYSIS, ROUTINE W REFLEX MICROSCOPIC - Abnormal; Notable for the following components:      Result Value   APPearance HAZY (*)    Hgb urine dipstick MODERATE (*)    Ketones, ur 5 (*)    Bacteria, UA RARE (*)    Squamous Epithelial / LPF 0-5 (*)    All other components within normal limits  BASIC METABOLIC PANEL - Abnormal; Notable for the following components:   Glucose, Bld 142 (*)    Calcium 8.5 (*)    All other components within normal limits  CBC - Abnormal; Notable for the following components:   WBC 14.3 (*)    All other components within normal limits  I-STAT BETA HCG BLOOD, ED ([facility], [facility], [facility] ONLY)    EKG  EKG Interpretation None       Radiology No results found.  Procedures Procedures (including critical care time)  Medications Ordered in ED Medications  sodium chloride 0.9 % bolus 1,000 mL (not administered)  ondansetron (ZOFRAN) injection 4 mg (not administered)  morphine 4 MG/ML injection 4 mg (not administered)     Initial Impression / Assessment and Plan / ED Course  I have reviewed the triage vital signs and the nursing notes.  Pertinent labs & imaging results that were available during my care of the patient were  reviewed by me and considered in my medical decision making (see chart for details).  T scan shows uncomplicated acute diverticulitis of the ascending colon.  This will be treated with Cipro and Flagyl, pain medicine, and follow-up with primary doctor.  She is to return as needed if she worsens.  Final Clinical Impressions(s) / ED Diagnoses   Final diagnoses:  None    ED Discharge Orders    None       Veryl Speak, MD 09/28/17 [phone]

## 2017-09-28 MED ORDER — METRONIDAZOLE 500 MG PO TABS: 500.0000 mg | ORAL_TABLET | Freq: Three times a day (TID) | ORAL | 0 refills | Status: DC

## 2017-09-28 MED ORDER — CIPROFLOXACIN HCL 500 MG PO TABS: 500.0000 mg | ORAL_TABLET | Freq: Two times a day (BID) | ORAL | 0 refills | Status: DC

## 2017-09-28 MED ORDER — HYDROCODONE-ACETAMINOPHEN 5-325 MG PO TABS: 1.0000 | ORAL_TABLET | Freq: Four times a day (QID) | ORAL | 0 refills | Status: DC | PRN

## 2017-09-28 NOTE — ED Notes (Signed)
Signature pad not available for pt at time of discharge. Pt verbalized understanding of discharge instructions and denied further requests.

## 2017-09-28 NOTE — Discharge Instructions (Signed)
Cipro and Flagyl as prescribed.  Hydrocodone as prescribed as needed for pain.  Return to the emergency department if you develop worsening pain, high fevers, bloody stools, or other new and concerning symptoms.

## 2017-09-28 NOTE — ED Notes (Signed)
Patient transported to CT 

## 2017-09-28 NOTE — ED Notes (Signed)
ED Provider at bedside. 

## 2017-09-28 NOTE — ED Notes (Signed)
Medications verified with Sharrie Rothman, RN. Scanner broken at this time.

## 2017-11-10 ENCOUNTER — Other Ambulatory Visit: Payer: Self-pay

## 2017-11-10 ENCOUNTER — Inpatient Hospital Stay (HOSPITAL_COMMUNITY)
Admission: AD | Admit: 2017-11-10 | Discharge: 2017-11-10 | Disposition: A | Discharge status: Home / Self Care | Payer: Medicaid Other | Source: Ambulatory Visit | Attending: Obstetrics and Gynecology | Admitting: Obstetrics and Gynecology

## 2017-11-10 ENCOUNTER — Encounter (HOSPITAL_COMMUNITY): Payer: Self-pay | Admitting: *Deleted

## 2017-11-10 DIAGNOSIS — O219 Vomiting of pregnancy, unspecified: Secondary | ICD-10-CM | POA: Diagnosis not present

## 2017-11-10 DIAGNOSIS — O99281 Endocrine, nutritional and metabolic diseases complicating pregnancy, first trimester: Secondary | ICD-10-CM | POA: Diagnosis not present

## 2017-11-10 DIAGNOSIS — O210 Mild hyperemesis gravidarum: Secondary | ICD-10-CM | POA: Diagnosis not present

## 2017-11-10 DIAGNOSIS — Z3A01 Less than 8 weeks gestation of pregnancy: Secondary | ICD-10-CM | POA: Diagnosis not present

## 2017-11-10 DIAGNOSIS — O10911 Unspecified pre-existing hypertension complicating pregnancy, first trimester: Secondary | ICD-10-CM | POA: Diagnosis not present

## 2017-11-10 DIAGNOSIS — Z85850 Personal history of malignant neoplasm of thyroid: Secondary | ICD-10-CM | POA: Diagnosis not present

## 2017-11-10 DIAGNOSIS — O10011 Pre-existing essential hypertension complicating pregnancy, first trimester: Secondary | ICD-10-CM | POA: Insufficient documentation

## 2017-11-10 DIAGNOSIS — R319 Hematuria, unspecified: Secondary | ICD-10-CM | POA: Diagnosis present

## 2017-11-10 DIAGNOSIS — I10 Essential (primary) hypertension: Secondary | ICD-10-CM | POA: Diagnosis present

## 2017-11-10 DIAGNOSIS — E039 Hypothyroidism, unspecified: Secondary | ICD-10-CM | POA: Insufficient documentation

## 2017-11-10 HISTORY — DX: Essential (primary) hypertension: I10

## 2017-11-10 LAB — URINALYSIS, ROUTINE W REFLEX MICROSCOPIC
Bilirubin Urine: NEGATIVE
Glucose, UA: NEGATIVE mg/dL
KETONES UR: NEGATIVE mg/dL
Nitrite: NEGATIVE
PROTEIN: NEGATIVE mg/dL
Specific Gravity, Urine: 1.024 (ref 1.005–1.030)
pH: 5 (ref 5.0–8.0)

## 2017-11-10 LAB — POCT PREGNANCY, URINE: PREG TEST UR: POSITIVE — AB

## 2017-11-10 MED ORDER — CONCEPT OB 130-92.4-1 MG PO CAPS: 1.0000 | ORAL_CAPSULE | Freq: Every day | ORAL | 12 refills | Status: DC

## 2017-11-10 MED ORDER — LABETALOL HCL 200 MG PO TABS: 200.0000 mg | ORAL_TABLET | Freq: Two times a day (BID) | ORAL | 6 refills | Status: DC

## 2017-11-10 MED ORDER — LABETALOL HCL 100 MG PO TABS
200.0000 mg | ORAL_TABLET | Freq: Once | ORAL | Status: AC
  Administered 2017-11-10: 200 mg via ORAL
  Filled 2017-11-10: qty 2

## 2017-11-10 MED ORDER — PROMETHAZINE HCL 25 MG PO TABS: 25.0000 mg | ORAL_TABLET | Freq: Four times a day (QID) | ORAL | 2 refills | Status: DC | PRN

## 2017-11-10 NOTE — MAU Note (Signed)
BP is up at times, "doesn't have high blood pressure", not on meds.

## 2017-11-10 NOTE — Discharge Instructions (Signed)
Hypertension During Pregnancy Hypertension is also called high blood pressure. High blood pressure means that the force of your blood moving in your body is too strong. When you are pregnant, this condition should be watched carefully. It can cause problems for you and your baby. Follow these instructions at home: Eating and drinking  Drink enough fluid to keep your pee (urine) clear or pale yellow.  Eat healthy foods that are low in salt (sodium). ? Do not add salt to your food. ? Check labels on foods and drinks to see much salt is in them. Look on the label where you see "Sodium." Lifestyle  Do not use any products that contain nicotine or tobacco, such as cigarettes and e-cigarettes. If you need help quitting, ask your doctor.  Do not use alcohol.  Avoid caffeine.  Avoid stress. Rest and get plenty of sleep. General instructions  Take over-the-counter and prescription medicines only as told by your doctor.  While lying down, lie on your left side. This keeps pressure off your baby.  While sitting or lying down, raise (elevate) your feet. Try putting some pillows under your lower legs.  Exercise regularly. Ask your doctor what kinds of exercise are best for you.  Keep all prenatal and follow-up visits as told by your doctor. This is important. Contact a doctor if:  You have symptoms that your doctor told you to watch for, such as: ? Fever. ? Throwing up (vomiting). ? Headache. Get help right away if:  You have very bad pain in your belly (abdomen).  You are throwing up, and this does not get better with treatment.  You suddenly get swelling in your hands, ankles, or face.  You gain 4 lb (1.8 kg) or more in 1 week.  You get bleeding from your vagina.  You have blood in your pee.  You do not feel your baby moving as much as normal.  You have a change in vision.  You have muscle twitching or sudden tightening (spasms).  You have trouble breathing.  Your lips  or fingernails turn blue. This information is not intended to replace advice given to you by your health care provider. Make sure you discuss any questions you have with your health care provider. Document Released: 08/23/2010 Document Revised: 04/01/2016 Document Reviewed: 04/01/2016 Elsevier Interactive Patient Education  2018 [employer].   Morning Sickness Morning sickness is when you feel sick to your stomach (nauseous) during pregnancy. This nauseous feeling may or may not come with vomiting. It often occurs in the morning but can be a problem any time of day. Morning sickness is most common during the first trimester, but it may continue throughout pregnancy. While morning sickness is unpleasant, it is usually harmless unless you develop severe and continual vomiting (hyperemesis gravidarum). This condition requires more intense treatment. What are the causes? The cause of morning sickness is not completely known but seems to be related to normal hormonal changes that occur in pregnancy. What increases the risk? You are at greater risk if you:  Experienced nausea or vomiting before your pregnancy.  Had morning sickness during a previous pregnancy.  Are pregnant with more than one baby, such as twins.  How is this treated? Do not use any medicines (prescription, over-the-counter, or herbal) for morning sickness without first talking to your health care provider. Your health care provider may prescribe or recommend:  Vitamin B6 supplements.  Anti-nausea medicines.  The herbal medicine ginger.  Follow these instructions at home:  Only take over-the-counter or prescription medicines as directed by your health care provider.  Taking multivitamins before getting pregnant can prevent or decrease the severity of morning sickness in most women.  Eat a piece of dry toast or unsalted crackers before getting out of bed in the morning.  Eat five or six small meals a day.  Eat dry  and bland foods (rice, baked potato). Foods high in carbohydrates are often helpful.  Do not drink liquids with your meals. Drink liquids between meals.  Avoid greasy, fatty, and spicy foods.  Get someone to cook for you if the smell of any food causes nausea and vomiting.  If you feel nauseous after taking prenatal vitamins, take the vitamins at night or with a snack.  Snack on protein foods (nuts, yogurt, cheese) between meals if you are hungry.  Eat unsweetened gelatins for desserts.  Wearing an acupressure wristband (worn for sea sickness) may be helpful.  Acupuncture may be helpful.  Do not smoke.  Get a humidifier to keep the air in your house free of odors.  Get plenty of fresh air. Contact a health care provider if:  Your home remedies are not working, and you need medicine.  You feel dizzy or lightheaded.  You are losing weight. Get help right away if:  You have persistent and uncontrolled nausea and vomiting.  You pass out (faint). This information is not intended to replace advice given to you by your health care provider. Make sure you discuss any questions you have with your health care provider. Document Released: 09/11/2006 Document Revised: 12/27/2015 Document Reviewed: 01/05/2013 Elsevier Interactive Patient Education  2017 [employer].

## 2017-11-10 NOTE — MAU Note (Signed)
Started throwing up 2 days ago.  Denies fever or diarrhea.  Has been having hot flashes.  Cycle is late, but has been irregular since thyroid cancer, has not done home test.

## 2017-11-10 NOTE — MAU Provider Note (Addendum)
History   [name] is a 32 year old female G3P1011 that presents to the MAU today for vomiting and nausea. The patient has a history of follicular thyroid cancer. She had been dealing with some abdominal pain in February that was diagnosed as uncomplicated acute diverticulitis. The patient thinks she could be possibly pregnant. The patient also admits to having high blood pressure that is not controlled on medication.   For the last four days she endorses night sweats and chills that only occur at night. She also has been nauseous for the last four days, but only vomits at night. She states that food and drink help alleviate the nausea. Her last period was in the first week of March but she usually does not regular menses. She also complains of slight abdominal soreness, which she attributes to the vomiting.     The patient complains of a copper like taste in her mouth. She states this was the same symptom that presented with her last pregnancy. She attributes her vomiting and nausea on this abnormal taste.   CSN: [id number]  Arrival date and time: 11/10/17 4098   First Provider Initiated Contact with Patient 11/10/17 0915      Chief Complaint  Patient presents with  . Emesis  . Possible Pregnancy   HPI  OB History    Gravida  3   Para  1   Term  1   Preterm      AB  1   Living  1     SAB  1   TAB      Ectopic      Multiple  0   Live Births              Past Medical History:  Diagnosis Date  . Anxiety   . Cancer ([facility]) 02/2012   thyroid cancer  . Hypertension   . Thyroid disease    cancer    Past Surgical History:  Procedure Laterality Date  . CESAREAN SECTION  2006  . DILATION AND EVACUATION N/A 02/07/2015   Procedure: DILATATION AND EVACUATION;  Surgeon: Lavonia Drafts, MD;  Location: [facility];  Service: Gynecology;  Laterality: N/A;  . THYROIDECTOMY  02/2012   total L side first and R side 7 days later.    Family History  Problem Relation Age  of Onset  . Hypertension Mother   . Blindness Mother        one eye    Social History   Tobacco Use  . Smoking status: Never Smoker  . Smokeless tobacco: Never Used  Substance Use Topics  . Alcohol use: No  . Drug use: No    Allergies: No Known Allergies  Medications Prior to Admission  Medication Sig Dispense Refill Last Dose  . amLODipine (NORVASC) 5 MG tablet Take 1 tablet (5 mg total) daily by mouth. 30 tablet 1   . ciprofloxacin (CIPRO) 500 MG tablet Take 1 tablet (500 mg total) by mouth 2 (two) times daily. One po bid x 7 days 14 tablet 0   . fexofenadine (ALLEGRA) 180 MG tablet Take 1 tablet (180 mg total) by mouth daily. 10 tablet 0   . HYDROcodone-acetaminophen (NORCO) 5-325 MG tablet Take 1-2 tablets by mouth every 6 (six) hours as needed. 15 tablet 0   . levothyroxine (SYNTHROID, LEVOTHROID) 200 MCG tablet Take 1 tablet (200 mcg total) by mouth daily before breakfast. 30 tablet 0 12/30/2016 at Unknown time  . levothyroxine (SYNTHROID, LEVOTHROID) 200 MCG  tablet Take 200 mcg daily before breakfast by mouth.   06/16/2017 at Unknown time  . metroNIDAZOLE (FLAGYL) 500 MG tablet Take 1 tablet (500 mg total) by mouth 3 (three) times daily. One po tid x 7 days 21 tablet 0   . ondansetron (ZOFRAN) 4 MG tablet Take 1 tablet (4 mg total) by mouth every 6 (six) hours. (Patient not taking: Reported on 06/16/2017) 12 tablet 0 Not Taking at Unknown time    Review of Systems  Constitutional: Positive for chills and diaphoresis. Negative for activity change, appetite change and fever.  HENT: Negative for postnasal drip, rhinorrhea, sneezing and sore throat.   Eyes: Negative for photophobia and visual disturbance.  Respiratory: Negative for apnea, cough, chest tightness, shortness of breath, wheezing and stridor.   Cardiovascular: Negative for chest pain, palpitations and leg swelling.  Gastrointestinal: Positive for nausea and vomiting. Negative for abdominal distention, abdominal pain,  constipation and diarrhea.  Genitourinary: Negative for difficulty urinating, dysuria, flank pain, frequency, genital sores, hematuria, pelvic pain, vaginal bleeding, vaginal discharge and vaginal pain.  Skin: Negative for color change, pallor, rash and wound.  Neurological: Negative for dizziness, syncope, weakness, light-headedness, numbness and headaches.   Physical Exam   Blood pressure (!) 169/98, pulse 78, temperature 98.9 F (37.2 C), temperature source Oral, resp. rate 17, weight 115.9 kg (255 lb 8 oz), last menstrual period 10/03/2017, SpO2 100 %, unknown if currently breastfeeding.  Physical Exam  Constitutional: She is oriented to person, place, and time. She appears well-developed and well-nourished. No distress.  HENT:  Head: Normocephalic and atraumatic.  Eyes: Pupils are equal, round, and reactive to light. Right eye exhibits no discharge. Left eye exhibits no discharge. No scleral icterus.  Neck: No tracheal deviation present.  Cardiovascular: Normal rate, regular rhythm, normal heart sounds and intact distal pulses.  Respiratory: Effort normal and breath sounds normal. No stridor. No respiratory distress. She has no wheezes. She has no rales. She exhibits no tenderness.  GI: Soft. Bowel sounds are normal. She exhibits no distension and no mass. There is no tenderness. There is no rebound and no guarding.  Neurological: She is alert and oriented to person, place, and time.  Skin: Skin is warm and dry. She is not diaphoretic.  Psychiatric: She has a normal mood and affect. Her behavior is normal.    MAU Course  Procedures  MDM Results for orders placed or performed during the hospital encounter of 11/10/17 (from the past 24 hour(s))  Urinalysis, Routine w reflex microscopic     Status: Abnormal   Collection Time: 11/10/17  8:44 AM  Result Value Ref Range   Color, Urine YELLOW YELLOW   APPearance CLOUDY (A) CLEAR   Specific Gravity, Urine 1.024 1.005 - 1.030   pH 5.0  5.0 - 8.0   Glucose, UA NEGATIVE NEGATIVE mg/dL   Hgb urine dipstick MODERATE (A) NEGATIVE   Bilirubin Urine NEGATIVE NEGATIVE   Ketones, ur NEGATIVE NEGATIVE mg/dL   Protein, ur NEGATIVE NEGATIVE mg/dL   Nitrite NEGATIVE NEGATIVE   Leukocytes, UA TRACE (A) NEGATIVE   RBC / HPF 6-30 0 - 5 RBC/hpf   WBC, UA 6-30 0 - 5 WBC/hpf   Bacteria, UA RARE (A) NONE SEEN   Squamous Epithelial / LPF 6-30 (A) NONE SEEN   Mucus PRESENT   Pregnancy, urine POC     Status: Abnormal   Collection Time: 11/10/17  8:52 AM  Result Value Ref Range   Preg Test, Ur POSITIVE (A) NEGATIVE  Assessment and Plan  A:  Pregnancy  HTN  Nausea  P:  Prescription for Labetalol 200 mg BID and follow up outpatient Prescription for Phenergan Neonatal Vitamins  Outpatient follow up for prenatal care  [name] 11/10/2017, 10:57 AM   CNM attestation:  I have seen and examined this patient and agree with above documentation in the PA student's note.   [name] is a 32 y.o. G3P1011 at [redacted]w[redacted]d reporting N/V x 4 days, mostly at night. Hasn't not tried anything for her Sx. Also reports being late for her period. Dx pregnancy in MAU today. Associated Sx: Neg for fever, abd pain, VB, vaginal discharge, chest pain, SOB. Pos for feeling hot and cold. No sick contacts.   Hx HTN. Not taking meds. Doesn't go to PCP.   PE: BP 148/97, 161/96, 144/100, 166/114,  Gen: calm comfortable, NAD Resp: normal effort, no distress Heart: Regular rate Abd: Soft, NT, gravid, S=D  ROS, labs, PMH reviewed No vomiting while in MAU. Results for orders placed or performed during the hospital encounter of 11/10/17 (from the past 168 hour(s))  Urinalysis, Routine w reflex microscopic   Collection Time: 11/10/17  8:44 AM  Result Value Ref Range   Color, Urine YELLOW YELLOW   APPearance CLOUDY (A) CLEAR   Specific Gravity, Urine 1.024 1.005 - 1.030   pH 5.0 5.0 - 8.0   Glucose, UA NEGATIVE NEGATIVE mg/dL   Hgb urine  dipstick MODERATE (A) NEGATIVE   Bilirubin Urine NEGATIVE NEGATIVE   Ketones, ur NEGATIVE NEGATIVE mg/dL   Protein, ur NEGATIVE NEGATIVE mg/dL   Nitrite NEGATIVE NEGATIVE   Leukocytes, UA TRACE (A) NEGATIVE   RBC / HPF 6-30 0 - 5 RBC/hpf   WBC, UA 6-30 0 - 5 WBC/hpf   Bacteria, UA RARE (A) NONE SEEN   Squamous Epithelial / LPF 6-30 (A) NONE SEEN   Mucus PRESENT   Pregnancy, urine POC   Collection Time: 11/10/17  8:52 AM  Result Value Ref Range   Preg Test, Ur POSITIVE (A) NEGATIVE    Orders Placed This Encounter  Procedures  . Urinalysis, Routine w reflex microscopic  . Pregnancy, urine POC  . Discharge patient   Meds ordered this encounter  Medications  . labetalol (NORMODYNE) tablet 200 mg  . promethazine (PHENERGAN) 25 MG tablet    Sig: Take 1 tablet (25 mg total) by mouth every 6 (six) hours as needed.    Dispense:  30 tablet    Refill:  2    Order Specific Question:   Supervising Provider    Answer:   Truett Mainland [[id number]]  . labetalol (NORMODYNE) 200 MG tablet    Sig: Take 1 tablet (200 mg total) by mouth 2 (two) times daily.    Dispense:  60 tablet    Refill:  6    Order Specific Question:   Supervising Provider    Answer:   Truett Mainland [[id number]]  . Prenat w/o A Vit-FeFum-FePo-FA (CONCEPT OB) 130-92.4-1 MG CAPS    Sig: Take 1 tablet by mouth daily.    Dispense:  30 capsule    Refill:  12    Order Specific Question:   Supervising Provider    Answer:   Truett Mainland [[id number]]   Offered pt Phenergan in MAU, but she drove herself and doesn't have a ride. OK for Rx.   MDM - NV of pregnancy. No dehydration or active vomiting. Rx phenergan - Uncontrolled CHTN w/out evidence of end  organ involvement. Consulted w/ Dr. Rosana Hoes. PO Labetalol started in MAU. BP check in office in 2 days. Discussed risks of uncontrolled HTN especially in pregnancy. Urged pt to be compliant w/ Meds and appts.  - Hx Thyroid CA on Synthroid. Continue Synthroid. Has F/U appt 4/11 w/ Endo.  Will leave monitoring of thyroid to them.  Assessment: 1. 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 Nausea and vomiting during pregnancy prior to [redacted] weeks gestation   3. Hypothyroid in pregnancy, antepartum, first trimester   4. Essential hypertension     Plan: - Discharge home in stable condition. - HTN and first trimester precautions. Follow-up [facility] for [facility] Follow up on 11/12/2017.   Specialty:  Obstetrics and Gynecology Why:  at 1:00 for blood pressure check Contact information: [place] [phone]       [facility] Follow up.   Why:  as needed in pregnancy emergencies Contact information: [address] [phone] [place] [phone]         Allergies as of 11/10/2017      Reactions   Hydrocodone Nausea And Vomiting      Medication List    STOP taking these medications   amLODipine 5 MG tablet Commonly known as:  NORVASC   ciprofloxacin 500 MG tablet Commonly known as:  CIPRO   fexofenadine 180 MG tablet Commonly known as:  ALLEGRA   HYDROcodone-acetaminophen 5-325 MG tablet Commonly known as:  NORCO   metroNIDAZOLE 500 MG tablet Commonly known as:  FLAGYL   ondansetron 4 MG tablet Commonly known as:  ZOFRAN     TAKE these medications   CONCEPT OB 130-92.4-1 MG Caps Take 1 tablet by mouth daily.   labetalol 200 MG tablet Commonly known as:  NORMODYNE Take 1 tablet (200 mg total) by mouth 2 (two) times daily.   levothyroxine 200 MCG tablet Commonly known as:  SYNTHROID, LEVOTHROID Take 1 tablet (200 mcg total) by mouth daily before breakfast. What changed:  Another medication with the same name was removed. Continue taking this medication, and follow the directions you see here.   promethazine 25 MG tablet Commonly known as:  PHENERGAN Take 1 tablet (25 mg total) by  mouth every 6 (six) hours as needed.        Tamala Julian, [place], [place] 11/11/2017 3:11 PM

## 2017-11-11 DIAGNOSIS — R319 Hematuria, unspecified: Secondary | ICD-10-CM | POA: Diagnosis present

## 2017-11-12 ENCOUNTER — Ambulatory Visit (INDEPENDENT_AMBULATORY_CARE_PROVIDER_SITE_OTHER): Payer: Medicaid Other | Admitting: *Deleted

## 2017-11-12 VITALS — BP 128/78 | HR 89 | Wt 253.3 lb

## 2017-11-12 DIAGNOSIS — O10919 Unspecified pre-existing hypertension complicating pregnancy, unspecified trimester: Secondary | ICD-10-CM

## 2017-11-12 MED ORDER — CONCEPT OB 130-92.4-1 MG PO CAPS
1.0000 | ORAL_CAPSULE | Freq: Every day | ORAL | 12 refills | Status: DC
Start: 2017-11-12 — End: 2018-06-04

## 2017-11-12 NOTE — Progress Notes (Signed)
BP normal today and patient is asymptomatic.   Chart reviewed for nurse visit. Agree with plan of care.   [place]arr [place], [place] 11/12/2017 5:23 PM

## 2017-11-12 NOTE — Progress Notes (Signed)
Pt denies H/A or visual disturbances. pt is taking Labetalol as directed. She states that she has nausea and the Phenergan makes her too sleepy - even when taking 1/2 tablet (12.5 mg).  I advised pt to try 1/4 tablet. She reports that she did not receive Rx for prenatal vitamins previously. Rx e-prescribed today.

## 2017-11-30 ENCOUNTER — Inpatient Hospital Stay (HOSPITAL_COMMUNITY)
Admission: AD | Admit: 2017-11-30 | Discharge: 2017-11-30 | Disposition: A | Discharge status: Home / Self Care | Payer: No Typology Code available for payment source | Source: Ambulatory Visit | Attending: Obstetrics and Gynecology | Admitting: Obstetrics and Gynecology

## 2017-11-30 NOTE — MAU Note (Signed)
Not in lobby #3 

## 2017-11-30 NOTE — MAU Note (Signed)
Not in lobby

## 2017-12-11 ENCOUNTER — Inpatient Hospital Stay (HOSPITAL_COMMUNITY)
Admission: AD | Admit: 2017-12-11 | Discharge: 2017-12-12 | Disposition: A | Discharge status: Home / Self Care | Payer: Medicaid Other | Source: Ambulatory Visit | Attending: Obstetrics and Gynecology | Admitting: Obstetrics and Gynecology

## 2017-12-11 DIAGNOSIS — Z79899 Other long term (current) drug therapy: Secondary | ICD-10-CM | POA: Insufficient documentation

## 2017-12-11 DIAGNOSIS — Z885 Allergy status to narcotic agent status: Secondary | ICD-10-CM | POA: Insufficient documentation

## 2017-12-11 DIAGNOSIS — O26891 Other specified pregnancy related conditions, first trimester: Secondary | ICD-10-CM

## 2017-12-11 DIAGNOSIS — R109 Unspecified abdominal pain: Secondary | ICD-10-CM | POA: Insufficient documentation

## 2017-12-11 DIAGNOSIS — O209 Hemorrhage in early pregnancy, unspecified: Secondary | ICD-10-CM | POA: Insufficient documentation

## 2017-12-11 DIAGNOSIS — O26892 Other specified pregnancy related conditions, second trimester: Secondary | ICD-10-CM | POA: Insufficient documentation

## 2017-12-11 DIAGNOSIS — Z3A10 10 weeks gestation of pregnancy: Secondary | ICD-10-CM | POA: Insufficient documentation

## 2017-12-11 DIAGNOSIS — Z85850 Personal history of malignant neoplasm of thyroid: Secondary | ICD-10-CM | POA: Insufficient documentation

## 2017-12-11 DIAGNOSIS — O161 Unspecified maternal hypertension, first trimester: Secondary | ICD-10-CM | POA: Insufficient documentation

## 2017-12-11 DIAGNOSIS — E890 Postprocedural hypothyroidism: Secondary | ICD-10-CM | POA: Insufficient documentation

## 2017-12-11 DIAGNOSIS — O99281 Endocrine, nutritional and metabolic diseases complicating pregnancy, first trimester: Secondary | ICD-10-CM | POA: Insufficient documentation

## 2017-12-11 DIAGNOSIS — Z79890 Hormone replacement therapy (postmenopausal): Secondary | ICD-10-CM | POA: Insufficient documentation

## 2017-12-11 NOTE — MAU Note (Addendum)
Pt arrived via EMS c/o of vaginal bleeding, passing clots  and lower abdominal pain X 12mins.

## 2017-12-12 ENCOUNTER — Encounter (HOSPITAL_COMMUNITY): Payer: Self-pay | Admitting: *Deleted

## 2017-12-12 ENCOUNTER — Inpatient Hospital Stay (HOSPITAL_COMMUNITY): Payer: Medicaid Other

## 2017-12-12 DIAGNOSIS — O26891 Other specified pregnancy related conditions, first trimester: Secondary | ICD-10-CM

## 2017-12-12 DIAGNOSIS — R109 Unspecified abdominal pain: Secondary | ICD-10-CM

## 2017-12-12 DIAGNOSIS — E890 Postprocedural hypothyroidism: Secondary | ICD-10-CM | POA: Diagnosis not present

## 2017-12-12 DIAGNOSIS — O209 Hemorrhage in early pregnancy, unspecified: Secondary | ICD-10-CM | POA: Diagnosis not present

## 2017-12-12 DIAGNOSIS — Z79899 Other long term (current) drug therapy: Secondary | ICD-10-CM | POA: Diagnosis not present

## 2017-12-12 DIAGNOSIS — O161 Unspecified maternal hypertension, first trimester: Secondary | ICD-10-CM | POA: Diagnosis not present

## 2017-12-12 DIAGNOSIS — O26892 Other specified pregnancy related conditions, second trimester: Secondary | ICD-10-CM | POA: Diagnosis not present

## 2017-12-12 DIAGNOSIS — Z79890 Hormone replacement therapy (postmenopausal): Secondary | ICD-10-CM | POA: Diagnosis not present

## 2017-12-12 DIAGNOSIS — Z85850 Personal history of malignant neoplasm of thyroid: Secondary | ICD-10-CM | POA: Diagnosis not present

## 2017-12-12 DIAGNOSIS — Z3A10 10 weeks gestation of pregnancy: Secondary | ICD-10-CM | POA: Diagnosis not present

## 2017-12-12 DIAGNOSIS — O99281 Endocrine, nutritional and metabolic diseases complicating pregnancy, first trimester: Secondary | ICD-10-CM | POA: Diagnosis not present

## 2017-12-12 DIAGNOSIS — Z885 Allergy status to narcotic agent status: Secondary | ICD-10-CM | POA: Diagnosis not present

## 2017-12-12 LAB — CBC
HCT: 34.9 % — ABNORMAL LOW (ref 36.0–46.0)
Hemoglobin: 11.7 g/dL — ABNORMAL LOW (ref 12.0–15.0)
MCH: 31 pg (ref 26.0–34.0)
MCHC: 33.5 g/dL (ref 30.0–36.0)
MCV: 92.3 fL (ref 78.0–100.0)
PLATELETS: 239 10*3/uL (ref 150–400)
RBC: 3.78 MIL/uL — AB (ref 3.87–5.11)
RDW: 12.7 % (ref 11.5–15.5)
WBC: 13.2 10*3/uL — ABNORMAL HIGH (ref 4.0–10.5)

## 2017-12-12 LAB — WET PREP, GENITAL
Sperm: NONE SEEN
Trich, Wet Prep: NONE SEEN
Yeast Wet Prep HPF POC: NONE SEEN

## 2017-12-12 LAB — HCG, QUANTITATIVE, PREGNANCY: HCG, BETA CHAIN, QUANT, S: 209007 m[IU]/mL — AB (ref <5)

## 2017-12-12 MED ORDER — METRONIDAZOLE 0.75 % VA GEL: 1.0000 | Freq: Every day | VAGINAL | 0 refills | Status: AC

## 2017-12-12 NOTE — Discharge Instructions (Signed)
Vaginal Bleeding During Pregnancy, First Trimester °A small amount of bleeding (spotting) from the vagina is common in early pregnancy. Sometimes the bleeding is normal and is not a problem, and sometimes it is a sign of something serious. Be sure to tell your doctor about any bleeding from your vagina right away. °Follow these instructions at home: °· Watch your condition for any changes. °· Follow your doctor's instructions about how active you can be. °· If you are on bed rest: °? You may need to stay in bed and only get up to use the bathroom. °? You may be allowed to do some activities. °? If you need help, make plans for someone to help you. °· Write down: °? The number of pads you use each day. °? How often you change pads. °? How soaked (saturated) your pads are. °· Do not use tampons. °· Do not douche. °· Do not have sex or orgasms until your doctor says it is okay. °· If you pass any tissue from your vagina, save the tissue so you can show it to your doctor. °· Only take medicines as told by your doctor. °· Do not take aspirin because it can make you bleed. °· Keep all follow-up visits as told by your doctor. °Contact a doctor if: °· You bleed from your vagina. °· You have cramps. °· You have labor pains. °· You have a fever that does not go away after you take medicine. °Get help right away if: °· You have very bad cramps in your back or belly (abdomen). °· You pass large clots or tissue from your vagina. °· You bleed more. °· You feel light-headed or weak. °· You pass out (faint). °· You have chills. °· You are leaking fluid or have a gush of fluid from your vagina. °· You pass out while pooping (having a bowel movement). °This information is not intended to replace advice given to you by your health care provider. Make sure you discuss any questions you have with your health care provider. °Document Released: 12/05/2013 Document Revised: 12/27/2015 Document Reviewed: 03/28/2013 °Elsevier Interactive  Patient Education © 2018 [employer] ° °

## 2017-12-12 NOTE — MAU Provider Note (Signed)
Chief Complaint: Vaginal Bleeding   SUBJECTIVE HPI: [name] is a 32 y.o. G3P1011 at [redacted]w[redacted]d who presents to MAU complaining of vaginal bleeding.  Patient states when she was in shower she had heavy bleeding.  Patient states that prior to getting in shower she had one big painful cramp.  When she had in the shower she had heavy bright red bleeding with clots.  Bleeding lasted for about 45 minutes.  States that since then she has had further bleeding.  Patient denies any fevers, vaginal discharge, abdominal pain. Has a h/o SAB. Current pregnancy is high risk due to h/o thyroid cancer s/p thyroidectomy and cHTN on labetalol (200mg  daily).    Past Medical History:  Diagnosis Date  . Anxiety   . Cancer ([place]) 02/2012   thyroid cancer  . Hypertension   . Thyroid disease    cancer   OB History  Gravida Para Term Preterm AB Living  3 1 1   1 1   SAB TAB Ectopic Multiple Live Births  1     0      # Outcome Date GA Lbr Len/2nd Weight Sex Delivery Anes PTL Lv  3 Current           2 SAB           1 Term            Past Surgical History:  Procedure Laterality Date  . CESAREAN SECTION  2006  . DILATION AND EVACUATION N/A 02/07/2015   Procedure: DILATATION AND EVACUATION;  Surgeon: Lavonia Drafts, MD;  Location: [facility];  Service: Gynecology;  Laterality: N/A;  . THYROIDECTOMY  02/2012   total L side first and R side 7 days later.   Social History   Socioeconomic History  . Marital status: Married    Spouse name: Not on file  . Number of children: Not on file  . Years of education: Not on file  . Highest education level: Not on file  Occupational History  . Not on file  Social Needs  . Financial resource strain: Not on file  . Food insecurity:    Worry: Not on file    Inability: Not on file  . Transportation needs:    Medical: Not on file    Non-medical: Not on file  Tobacco Use  . Smoking status: Never Smoker  . Smokeless tobacco: Never Used  Substance and Sexual  Activity  . Alcohol use: No  . Drug use: No  . Sexual activity: Yes    Birth control/protection: None  Lifestyle  . Physical activity:    Days per week: Not on file    Minutes per session: Not on file  . Stress: Not on file  Relationships  . Social connections:    Talks on phone: Not on file    Gets together: Not on file    Attends religious service: Not on file    Active member of club or organization: Not on file    Attends meetings of clubs or organizations: Not on file    Relationship status: Not on file  . Intimate partner violence:    Fear of current or ex partner: Not on file    Emotionally abused: Not on file    Physically abused: Not on file    Forced sexual activity: Not on file  Other Topics Concern  . Not on file  Social History Narrative  . Not on file   No current facility-administered medications on file  prior to encounter.    Current Outpatient Medications on File Prior to Encounter  Medication Sig Dispense Refill  . labetalol (NORMODYNE) 200 MG tablet Take 1 tablet (200 mg total) by mouth 2 (two) times daily. 60 tablet 6  . levothyroxine (SYNTHROID, LEVOTHROID) 200 MCG tablet Take 1 tablet (200 mcg total) by mouth daily before breakfast. 30 tablet 0  . Prenat w/o A Vit-FeFum-FePo-FA (CONCEPT OB) 130-92.4-1 MG CAPS Take 1 tablet by mouth daily. 30 capsule 12  . promethazine (PHENERGAN) 25 MG tablet Take 1 tablet (25 mg total) by mouth every 6 (six) hours as needed. 30 tablet 2   Allergies  Allergen Reactions  . Hydrocodone Nausea And Vomiting    I have reviewed the past Medical Hx, Surgical Hx, Social Hx, Allergies and Medications.   REVIEW OF SYSTEMS All systems reviewed and are negative for acute change except as noted in the HPI.   OBJECTIVE BP (!) 156/96   Pulse 94   Temp 98.3 F (36.8 C) (Oral)   Resp 20   LMP 10/03/2017    PHYSICAL EXAM Constitutional: Well-developed, well-nourished female in no acute distress.  Cardiovascular: normal  rate and rhythm, pulses intact Respiratory: normal rate and effort.  GI: Abd soft, non-tender, non-distended.  MS: Extremities nontender, no edema, normal ROM Neurologic: Alert and oriented x 4. No focal deficits SPECULUM EXAM: NEFG, physiologic discharge, no blood noted, cervix clean BIMANUAL: cervix closed; uterus normal size, no adnexal tenderness or masses. No CMT. Psych: normal mood and affect Skin: warm and dry   LAB RESULTS Results for orders placed or performed during the hospital encounter of 12/11/17  Wet prep, genital  Result Value Ref Range   Yeast Wet Prep HPF POC NONE SEEN NONE SEEN   Trich, Wet Prep NONE SEEN NONE SEEN   Clue Cells Wet Prep HPF POC PRESENT (A) NONE SEEN   WBC, Wet Prep HPF POC FEW (A) NONE SEEN   Sperm NONE SEEN   CBC  Result Value Ref Range   WBC 13.2 (H) 4.0 - 10.5 K/uL   RBC 3.78 (L) 3.87 - 5.11 MIL/uL   Hemoglobin 11.7 (L) 12.0 - 15.0 g/dL   HCT 34.9 (L) 36.0 - 46.0 %   MCV 92.3 78.0 - 100.0 fL   MCH 31.0 26.0 - 34.0 pg   MCHC 33.5 30.0 - 36.0 g/dL   RDW 12.7 11.5 - 15.5 %   Platelets 239 150 - 400 K/uL  hCG, quantitative, pregnancy  Result Value Ref Range   hCG, Beta Chain, Quant, S 209,007 (H) <5 mIU/mL    IMAGING US Ob Comp Less 14 Wks  Result Date: 12/12/2017 CLINICAL DATA:  32 year old pregnant female with vaginal bleeding. LMP: 10/08/2017 corresponding to an estimated gestational age of [redacted] weeks, 2 days. EXAM: OBSTETRIC <14 WK ULTRASOUND TECHNIQUE: Transabdominal ultrasound was performed for evaluation of the gestation as well as the maternal uterus and adnexal regions. COMPARISON:  None. FINDINGS: Intrauterine gestational sac: Single intrauterine gestational sac. Yolk sac:  Seen Embryo:  Present Cardiac Activity: Detected Heart Rate: 167 bpm CRL:   24 mm   9 w 1 d                  [place] EDC: 07/16/2018 Subchorionic hemorrhage:  None visualized. Maternal uterus/adnexae: The right ovary measures 2.2 x 2.0 x 2.5 cm and appears unremarkable.  The left ovary is not visualized. No free fluid within the pelvis. IMPRESSION: Single live intrauterine pregnancy with an estimated gestational age  of [redacted] weeks, 1 day based on today's ultrasound and concordant with LMP. Electronically Signed   By: Anner Crete M.D.   On: 12/12/2017 01:53    MAU Management/MDM: Vitals and nursing notes reviewed Orders Placed This Encounter  Procedures  . Wet prep, genital  . US OB Comp Less 14 Wks  . CBC  . hCG, quantitative, pregnancy    Meds ordered this encounter  Medications  . metroNIDAZOLE (METROGEL) 0.75 % vaginal gel    Sig: Place 1 Applicatorful vaginally at bedtime for 5 days.    Dispense:  50 g    Refill:  0   VSS. Hbg stable. No bleeding appreciated on speculum exam. Abdomen non-surgical. US showing normal IUP. Unknown cause of bleeding at this time. Patient at increased risk for threatened abortion. Wet prep with signs of BV.  BPs elevated. Patient with cHTN in pregnancy. Has only been taking labetalol once daily instead of BID per instructions.   Plan of care reviewed with patient, including labs and tests ordered and medical treatment.   ASSESSMENT 1. Vaginal bleeding in pregnancy, first trimester   2. Abdominal pain during pregnancy in first trimester     PLAN Discharge home in stable condition. Take labetalol 200mg  BID Rx for metrogel to treat BV Pt discharged with strict return precautions. Follow-up with OB provider Handout given    Allergies as of 12/12/2017      Reactions   Hydrocodone Nausea And Vomiting      Medication List    TAKE these medications   CONCEPT OB 130-92.4-1 MG Caps Take 1 tablet by mouth daily.   labetalol 200 MG tablet Commonly known as:  NORMODYNE Take 1 tablet (200 mg total) by mouth 2 (two) times daily.   levothyroxine 200 MCG tablet Commonly known as:  SYNTHROID, LEVOTHROID Take 1 tablet (200 mcg total) by mouth daily before breakfast.   metroNIDAZOLE 0.75 % vaginal  gel Commonly known as:  METROGEL Place 1 Applicatorful vaginally at bedtime for 5 days.   promethazine 25 MG tablet Commonly known as:  PHENERGAN Take 1 tablet (25 mg total) by mouth every 6 (six) hours as needed.       Luiz Blare, DO [facility]  12/12/2017, 12:25 AM

## 2017-12-14 LAB — GC/CHLAMYDIA PROBE AMP (~~LOC~~) NOT AT ARMC
CHLAMYDIA, DNA PROBE: NEGATIVE
NEISSERIA GONORRHEA: NEGATIVE

## 2017-12-16 ENCOUNTER — Encounter: Payer: Self-pay | Admitting: Advanced Practice Midwife

## 2017-12-16 ENCOUNTER — Encounter: Payer: Self-pay | Admitting: *Deleted

## 2017-12-16 ENCOUNTER — Ambulatory Visit (INDEPENDENT_AMBULATORY_CARE_PROVIDER_SITE_OTHER): Payer: Medicaid Other | Admitting: Advanced Practice Midwife

## 2017-12-16 ENCOUNTER — Other Ambulatory Visit: Payer: Self-pay | Admitting: *Deleted

## 2017-12-16 ENCOUNTER — Other Ambulatory Visit (HOSPITAL_COMMUNITY)
Admission: RE | Admit: 2017-12-16 | Discharge: 2017-12-16 | Disposition: A | Discharge status: Home / Self Care | Payer: Medicaid Other | Source: Ambulatory Visit | Attending: Advanced Practice Midwife | Admitting: Advanced Practice Midwife

## 2017-12-16 VITALS — BP 140/79 | HR 84 | Wt 254.5 lb

## 2017-12-16 DIAGNOSIS — O10919 Unspecified pre-existing hypertension complicating pregnancy, unspecified trimester: Secondary | ICD-10-CM

## 2017-12-16 DIAGNOSIS — E890 Postprocedural hypothyroidism: Secondary | ICD-10-CM

## 2017-12-16 DIAGNOSIS — O0990 Supervision of high risk pregnancy, unspecified, unspecified trimester: Secondary | ICD-10-CM | POA: Insufficient documentation

## 2017-12-16 DIAGNOSIS — Z98891 History of uterine scar from previous surgery: Secondary | ICD-10-CM | POA: Insufficient documentation

## 2017-12-16 DIAGNOSIS — C73 Malignant neoplasm of thyroid gland: Secondary | ICD-10-CM

## 2017-12-16 LAB — POCT URINALYSIS DIP (DEVICE)
BILIRUBIN URINE: NEGATIVE
Glucose, UA: NEGATIVE mg/dL
Ketones, ur: NEGATIVE mg/dL
Leukocytes, UA: NEGATIVE
NITRITE: NEGATIVE
PH: 5.5 (ref 5.0–8.0)
Protein, ur: NEGATIVE mg/dL
Specific Gravity, Urine: >=1.03 (ref 1.005–1.030)
Urobilinogen, UA: 0.2 mg/dL (ref 0.0–1.0)

## 2017-12-16 MED ORDER — NIFEDIPINE ER OSMOTIC RELEASE 30 MG PO TB24: 30.0000 mg | ORAL_TABLET | Freq: Every day | ORAL | 5 refills | Status: DC

## 2017-12-16 NOTE — Patient Instructions (Signed)
First Trimester of Pregnancy The first trimester of pregnancy is from week 1 until the end of week 13 (months 1 through 3). A week after a sperm fertilizes an egg, the egg will implant on the wall of the uterus. This embryo will begin to develop into a baby. Genes from you and your partner will form the baby. The female genes will determine whether the baby will be a boy or a girl. At 6-8 weeks, the eyes and face will be formed, and the heartbeat can be seen on ultrasound. At the end of 12 weeks, all the baby's organs will be formed. Now that you are pregnant, you will want to do everything you can to have a healthy baby. Two of the most important things are to get good prenatal care and to follow your health care provider's instructions. Prenatal care is all the medical care you receive before the baby's birth. This care will help prevent, find, and treat any problems during the pregnancy and childbirth. Body changes during your first trimester Your body goes through many changes during pregnancy. The changes vary from woman to woman.  You may gain or lose a couple of pounds at first.  You may feel sick to your stomach (nauseous) and you may throw up (vomit). If the vomiting is uncontrollable, call your health care provider.  You may tire easily.  You may develop headaches that can be relieved by medicines. All medicines should be approved by your health care provider.  You may urinate more often. Painful urination may mean you have a bladder infection.  You may develop heartburn as a result of your pregnancy.  You may develop constipation because certain hormones are causing the muscles that push stool through your intestines to slow down.  You may develop hemorrhoids or swollen veins (varicose veins).  Your breasts may begin to grow larger and become tender. Your nipples may stick out more, and the tissue that surrounds them (areola) may become darker.  Your gums may bleed and may be  sensitive to brushing and flossing.  Dark spots or blotches (chloasma, mask of pregnancy) may develop on your face. This will likely fade after the baby is born.  Your menstrual periods will stop.  You may have a loss of appetite.  You may develop cravings for certain kinds of food.  You may have changes in your emotions from day to day, such as being excited to be pregnant or being concerned that something may go wrong with the pregnancy and baby.  You may have more vivid and strange dreams.  You may have changes in your hair. These can include thickening of your hair, rapid growth, and changes in texture. Some women also have hair loss during or after pregnancy, or hair that feels dry or thin. Your hair will most likely return to normal after your baby is born.  What to expect at prenatal visits During a routine prenatal visit:  You will be weighed to make sure you and the baby are growing normally.  Your blood pressure will be taken.  Your abdomen will be measured to track your baby's growth.  The fetal heartbeat will be listened to between weeks 10 and 14 of your pregnancy.  Test results from any previous visits will be discussed.  Your health care provider may ask you:  How you are feeling.  If you are feeling the baby move.  If you have had any abnormal symptoms, such as leaking fluid, bleeding, severe headaches,   or abdominal cramping.  If you are using any tobacco products, including cigarettes, chewing tobacco, and electronic cigarettes.  If you have any questions.  Other tests that may be performed during your first trimester include:  Blood tests to find your blood type and to check for the presence of any previous infections. The tests will also be used to check for low iron levels (anemia) and protein on red blood cells (Rh antibodies). Depending on your risk factors, or if you previously had diabetes during pregnancy, you may have tests to check for high blood  sugar that affects pregnant women (gestational diabetes).  Urine tests to check for infections, diabetes, or protein in the urine.  An ultrasound to confirm the proper growth and development of the baby.  Fetal screens for spinal cord problems (spina bifida) and Down syndrome.  HIV (human immunodeficiency virus) testing. Routine prenatal testing includes screening for HIV, unless you choose not to have this test.  You may need other tests to make sure you and the baby are doing well.  Follow these instructions at home: Medicines  Follow your health care provider's instructions regarding medicine use. Specific medicines may be either safe or unsafe to take during pregnancy.  Take a prenatal vitamin that contains at least 600 micrograms (mcg) of folic acid.  If you develop constipation, try taking a stool softener if your health care provider approves. Eating and drinking  Eat a balanced diet that includes fresh fruits and vegetables, whole grains, good sources of protein such as meat, eggs, or tofu, and low-fat dairy. Your health care provider will help you determine the amount of weight gain that is right for you.  Avoid raw meat and uncooked cheese. These carry germs that can cause birth defects in the baby.  Eating four or five small meals rather than three large meals a day may help relieve nausea and vomiting. If you start to feel nauseous, eating a few soda crackers can be helpful. Drinking liquids between meals, instead of during meals, also seems to help ease nausea and vomiting.  Limit foods that are high in fat and processed sugars, such as fried and sweet foods.  To prevent constipation: ? Eat foods that are high in fiber, such as fresh fruits and vegetables, whole grains, and beans. ? Drink enough fluid to keep your urine clear or pale yellow. Activity  Exercise only as directed by your health care provider. Most women can continue their usual exercise routine during  pregnancy. Try to exercise for 30 minutes at least 5 days a week. Exercising will help you: ? Control your weight. ? Stay in shape. ? Be prepared for labor and delivery.  Experiencing pain or cramping in the lower abdomen or lower back is a good sign that you should stop exercising. Check with your health care provider before continuing with normal exercises.  Try to avoid standing for long periods of time. Move your legs often if you must stand in one place for a long time.  Avoid heavy lifting.  Wear low-heeled shoes and practice good posture.  You may continue to have sex unless your health care provider tells you not to. Relieving pain and discomfort  Wear a good support bra to relieve breast tenderness.  Take warm sitz baths to soothe any pain or discomfort caused by hemorrhoids. Use hemorrhoid cream if your health care provider approves.  Rest with your legs elevated if you have leg cramps or low back pain.  If you develop   varicose veins in your legs, wear support hose. Elevate your feet for 15 minutes, 3-4 times a day. Limit salt in your diet. Prenatal care  Schedule your prenatal visits by the twelfth week of pregnancy. They are usually scheduled monthly at first, then more often in the last 2 months before delivery.  Write down your questions. Take them to your prenatal visits.  Keep all your prenatal visits as told by your health care provider. This is important. Safety  Wear your seat belt at all times when driving.  Make a list of emergency phone numbers, including numbers for family, friends, the hospital, and police and fire departments. General instructions  Ask your health care provider for a referral to a local prenatal education class. Begin classes no later than the beginning of month 6 of your pregnancy.  Ask for help if you have counseling or nutritional needs during pregnancy. Your health care provider can offer advice or refer you to specialists for help  with various needs.  Do not use hot tubs, steam rooms, or saunas.  Do not douche or use tampons or scented sanitary pads.  Do not cross your legs for long periods of time.  Avoid cat litter boxes and soil used by cats. These carry germs that can cause birth defects in the baby and possibly loss of the fetus by miscarriage or stillbirth.  Avoid all smoking, herbs, alcohol, and medicines not prescribed by your health care provider. Chemicals in these products affect the formation and growth of the baby.  Do not use any products that contain nicotine or tobacco, such as cigarettes and e-cigarettes. If you need help quitting, ask your health care provider. You may receive counseling support and other resources to help you quit.  Schedule a dentist appointment. At home, brush your teeth with a soft toothbrush and be gentle when you floss. Contact a health care provider if:  You have dizziness.  You have mild pelvic cramps, pelvic pressure, or nagging pain in the abdominal area.  You have persistent nausea, vomiting, or diarrhea.  You have a bad smelling vaginal discharge.  You have pain when you urinate.  You notice increased swelling in your face, hands, legs, or ankles.  You are exposed to fifth disease or chickenpox.  You are exposed to German measles (rubella) and have never had it. Get help right away if:  You have a fever.  You are leaking fluid from your vagina.  You have spotting or bleeding from your vagina.  You have severe abdominal cramping or pain.  You have rapid weight gain or loss.  You vomit blood or material that looks like coffee grounds.  You develop a severe headache.  You have shortness of breath.  You have any kind of trauma, such as from a fall or a car accident. Summary  The first trimester of pregnancy is from week 1 until the end of week 13 (months 1 through 3).  Your body goes through many changes during pregnancy. The changes vary from  woman to woman.  You will have routine prenatal visits. During those visits, your health care provider will examine you, discuss any test results you may have, and talk with you about how you are feeling. This information is not intended to replace advice given to you by your health care provider. Make sure you discuss any questions you have with your health care provider. Document Released: 07/15/2001 Document Revised: 07/02/2016 Document Reviewed: 07/02/2016 Elsevier Interactive Patient Education  2018 Elsevier   Inc.  

## 2017-12-16 NOTE — Progress Notes (Signed)
Here for new ob. States had bleeding 12/12/17 and came to hospital. No bleeding since then. Given new ob booklets. Already has MyChart. Signed up for [employer] app only. Declines flu shot.  Addendum: PMH completed.

## 2017-12-16 NOTE — Progress Notes (Signed)
   PRENATAL VISIT NOTE  Subjective:  [name] is a 32 y.o. G3P1011 at [redacted]w[redacted]d being seen today for initial prenatal visit.  She is currently monitored for the following issues for this high-risk pregnancy and has Hypertension; Postoperative hypothyroidism; Thyroid cancer ([place]); Hematuria; Supervision of high risk pregnancy, antepartum; Chronic hypertension in pregnancy; History of cesarean delivery; and Status post complete thyroidectomy on their problem list.  Patient reports no complaints.  Contractions: Not present. Vag. Bleeding: Other.  Movement: Absent. Denies leaking of fluid.   The following portions of the patient's history were reviewed and updated as appropriate: allergies, current medications, past family history, past medical history, past social history, past surgical history and problem list. Problem list updated.  Objective:   Vitals:   12/16/17 1112  BP: 140/79  Pulse: 84  Weight: 254 lb 8 oz (115.4 kg)    Fetal Status: Fetal Heart Rate (bpm): 164   Movement: Absent     VS reviewed, nursing note reviewed,  Constitutional: well developed, well nourished, no distress HEENT: normocephalic CV: normal rate Pulm/chest wall: normal effort Breast Exam:  right breast normal without mass, skin or nipple changes or axillary nodes, left breast normal without mass, skin or nipple changes or axillary nodes Abdomen: soft Neuro: alert and oriented x 3 Skin: warm, dry Psych: affect normal Pelvic exam: Cervix pink, visually closed, without lesion, scant white creamy discharge, vaginal walls and external genitalia normal Bimanual exam: Cervix 0/long/high, firm, anterior, neg CMT, uterus nontender, ~10 week size, adnexa without tenderness, enlargement, or mass  Assessment and Plan:  Pregnancy: G3P1011 at [redacted]w[redacted]d  1. Supervision of high risk pregnancy, antepartum --Discussed genetic screening as optional testing, presented risks/benefits/alternatives including selecting no  serum genetic screening.  Anatomy US is a form of genetic screening, discussed limitations of [place] in detecting Downs syndrome. Pt desires NIPS.  Will schedule next week at 11 weeks due to obesity, better likelihood of adequate sample. - [facility] AMB BABYSCRIPTS OPT IN - Obstetric Panel, Including HIV - Culture, OB Urine - Hemoglobinopathy Evaluation - SMN1 COPY NUMBER ANALYSIS (SMA Carrier Screen) --Pap collected today with cotesting  2. Chronic hypertension in pregnancy --Pt reports she often forgets her second dose of labetalol in the evening, desires once daily dosing.   --Consult Dr Rip Harbour. Will try Procardia 30XL daily, BP check next week with NIPS testing.  - [facility] AMB BABYSCRIPTS OPT IN - Obstetric Panel, Including HIV - Culture, OB Urine - Hemoglobinopathy Evaluation - SMN1 COPY NUMBER ANALYSIS (SMA Carrier Screen)  3. History of cesarean delivery --Desires repeat  4. Thyroid cancer ([place])  --Hx of total thyroidectomy, on meds.  Preterm labor symptoms and general obstetric precautions including but not limited to vaginal bleeding, contractions, leaking of fluid and fetal movement were reviewed in detail with the patient. Please refer to After Visit Summary for other counseling recommendations.  No follow-ups on file.  No future appointments.  Fatima Blank, CNM

## 2017-12-16 NOTE — Progress Notes (Signed)
Bedside US for FHR = 164 bpm per M-mode.  Yolk sac and FM observed.

## 2017-12-19 LAB — URINE CULTURE, OB REFLEX

## 2017-12-19 LAB — CULTURE, OB URINE

## 2017-12-23 LAB — SMN1 COPY NUMBER ANALYSIS (SMA CARRIER SCREENING)

## 2017-12-23 LAB — OBSTETRIC PANEL, INCLUDING HIV
Antibody Screen: NEGATIVE
Basophils Absolute: 0 10*3/uL (ref 0.0–0.2)
Basos: 0 %
EOS (ABSOLUTE): 0.1 10*3/uL (ref 0.0–0.4)
Eos: 1 %
HIV Screen 4th Generation wRfx: NONREACTIVE
Hematocrit: 34.4 % (ref 34.0–46.6)
Hemoglobin: 11.6 g/dL (ref 11.1–15.9)
Hepatitis B Surface Ag: NEGATIVE
Immature Grans (Abs): 0 10*3/uL (ref 0.0–0.1)
Immature Granulocytes: 0 %
Lymphocytes Absolute: 1.8 10*3/uL (ref 0.7–3.1)
Lymphs: 19 %
MCH: 30.4 pg (ref 26.6–33.0)
MCHC: 33.7 g/dL (ref 31.5–35.7)
MCV: 90 fL (ref 79–97)
Monocytes Absolute: 0.8 10*3/uL (ref 0.1–0.9)
Monocytes: 8 %
Neutrophils Absolute: 6.8 10*3/uL (ref 1.4–7.0)
Neutrophils: 72 %
Platelets: 277 10*3/uL (ref 150–379)
RBC: 3.81 x10E6/uL (ref 3.77–5.28)
RDW: 13.2 % (ref 12.3–15.4)
RPR Ser Ql: NONREACTIVE
Rh Factor: POSITIVE
Rubella Antibodies, IGG: 3.74 index (ref >0.99)
WBC: 9.4 10*3/uL (ref 3.4–10.8)

## 2017-12-23 LAB — HEMOGLOBINOPATHY EVALUATION
Ferritin: 116 ng/mL (ref 15–150)
HGB C: 0 %
HGB SOLUBILITY: NEGATIVE
Hgb A2 Quant: 2.4 % (ref 1.8–3.2)
Hgb A: 97 % (ref 96.4–98.8)
Hgb F Quant: 0.6 % (ref 0.0–2.0)
Hgb S: 0 %
Hgb Variant: 0 %

## 2017-12-25 ENCOUNTER — Ambulatory Visit: Payer: Medicaid Other

## 2017-12-29 ENCOUNTER — Encounter: Payer: Self-pay | Admitting: *Deleted

## 2017-12-29 ENCOUNTER — Other Ambulatory Visit: Payer: Self-pay

## 2017-12-29 ENCOUNTER — Inpatient Hospital Stay (HOSPITAL_COMMUNITY)
Admission: AD | Admit: 2017-12-29 | Discharge: 2017-12-29 | Disposition: A | Discharge status: Home / Self Care | Payer: Medicaid Other | Source: Ambulatory Visit | Attending: Obstetrics and Gynecology | Admitting: Obstetrics and Gynecology

## 2017-12-29 ENCOUNTER — Encounter (HOSPITAL_COMMUNITY): Payer: Self-pay | Admitting: *Deleted

## 2017-12-29 DIAGNOSIS — O99281 Endocrine, nutritional and metabolic diseases complicating pregnancy, first trimester: Secondary | ICD-10-CM | POA: Insufficient documentation

## 2017-12-29 DIAGNOSIS — N898 Other specified noninflammatory disorders of vagina: Secondary | ICD-10-CM | POA: Diagnosis not present

## 2017-12-29 DIAGNOSIS — Z885 Allergy status to narcotic agent status: Secondary | ICD-10-CM | POA: Diagnosis not present

## 2017-12-29 DIAGNOSIS — Z98891 History of uterine scar from previous surgery: Secondary | ICD-10-CM

## 2017-12-29 DIAGNOSIS — B9689 Other specified bacterial agents as the cause of diseases classified elsewhere: Secondary | ICD-10-CM | POA: Diagnosis not present

## 2017-12-29 DIAGNOSIS — O34219 Maternal care for unspecified type scar from previous cesarean delivery: Secondary | ICD-10-CM | POA: Diagnosis not present

## 2017-12-29 DIAGNOSIS — Z79890 Hormone replacement therapy (postmenopausal): Secondary | ICD-10-CM | POA: Insufficient documentation

## 2017-12-29 DIAGNOSIS — O10011 Pre-existing essential hypertension complicating pregnancy, first trimester: Secondary | ICD-10-CM | POA: Diagnosis not present

## 2017-12-29 DIAGNOSIS — Z85850 Personal history of malignant neoplasm of thyroid: Secondary | ICD-10-CM | POA: Diagnosis not present

## 2017-12-29 DIAGNOSIS — Z3A11 11 weeks gestation of pregnancy: Secondary | ICD-10-CM

## 2017-12-29 DIAGNOSIS — O23591 Infection of other part of genital tract in pregnancy, first trimester: Secondary | ICD-10-CM | POA: Insufficient documentation

## 2017-12-29 DIAGNOSIS — Z79899 Other long term (current) drug therapy: Secondary | ICD-10-CM | POA: Insufficient documentation

## 2017-12-29 DIAGNOSIS — N760 Acute vaginitis: Secondary | ICD-10-CM | POA: Diagnosis not present

## 2017-12-29 DIAGNOSIS — O26891 Other specified pregnancy related conditions, first trimester: Secondary | ICD-10-CM | POA: Diagnosis not present

## 2017-12-29 DIAGNOSIS — E890 Postprocedural hypothyroidism: Secondary | ICD-10-CM | POA: Diagnosis not present

## 2017-12-29 DIAGNOSIS — O10919 Unspecified pre-existing hypertension complicating pregnancy, unspecified trimester: Secondary | ICD-10-CM

## 2017-12-29 DIAGNOSIS — O0990 Supervision of high risk pregnancy, unspecified, unspecified trimester: Secondary | ICD-10-CM

## 2017-12-29 LAB — WET PREP, GENITAL
CLUE CELLS WET PREP: NONE SEEN
Sperm: NONE SEEN
Trich, Wet Prep: NONE SEEN
Yeast Wet Prep HPF POC: NONE SEEN

## 2017-12-29 LAB — URINALYSIS, ROUTINE W REFLEX MICROSCOPIC
Bacteria, UA: NONE SEEN
Bilirubin Urine: NEGATIVE
GLUCOSE, UA: NEGATIVE mg/dL
KETONES UR: NEGATIVE mg/dL
Leukocytes, UA: NEGATIVE
NITRITE: NEGATIVE
PH: 5 (ref 5.0–8.0)
Protein, ur: NEGATIVE mg/dL
SPECIFIC GRAVITY, URINE: 1.025 (ref 1.005–1.030)

## 2017-12-29 MED ORDER — METRONIDAZOLE 500 MG PO TABS
500.0000 mg | ORAL_TABLET | Freq: Two times a day (BID) | ORAL | 0 refills | Status: DC
Start: 2017-12-29 — End: 2018-01-28

## 2017-12-29 NOTE — Progress Notes (Signed)
Wet prep specimen obtained by L. Leftwich-Kirby, CNM.

## 2017-12-29 NOTE — MAU Provider Note (Signed)
Chief Complaint: Vaginal Discharge and Vaginal Irritation   First Provider Initiated Contact with Patient 12/29/17 [phone]      SUBJECTIVE HPI: [name] is a 32 y.o. G3P1011 at [redacted]w[redacted]d by LMP pt of [facility] who presents to maternity admissions reporting vaginal discharge and irritation x 2 days.  She was diagnosed with BV on 5/11 in MAU when she presented with vaginal bleeding and prescribed Metrogel. She did not start the medication because bleeding stopped.  On 5/15, she was seen for initial OB visit and I discussed taking the medicine vs not taking it. Since she was no longer bleeding and no other symptoms, she chose not to take the medication.  She presents today with thin white vaginal discharge and vaginal irritation. She denies pain or bleeding. There is no itching.  There are are no other symptoms. She has not tried any treatments.  She has CHTN and usually takes her daily Procardia at 9 am but has not taken it today. She has a BP check and NIPS testing in the office this week.    HPI  Past Medical History:  Diagnosis Date  . Anxiety   . Cancer ([place]) 02/2012   thyroid cancer  . Hypertension   . Thyroid disease    cancer   Past Surgical History:  Procedure Laterality Date  . CESAREAN SECTION  2006  . DILATION AND EVACUATION N/A 02/07/2015   Procedure: DILATATION AND EVACUATION;  Surgeon: Lavonia Drafts, MD;  Location: [facility];  Service: Gynecology;  Laterality: N/A;  . THYROIDECTOMY  02/2012   total L side first and R side 7 days later.   Social History   Socioeconomic History  . Marital status: Single    Spouse name: Not on file  . Number of children: Not on file  . Years of education: Not on file  . Highest education level: Not on file  Occupational History  . Not on file  Social Needs  . Financial resource strain: Not on file  . Food insecurity:    Worry: Not on file    Inability: Not on file  . Transportation needs:    Medical: Not on file   Non-medical: Not on file  Tobacco Use  . Smoking status: Never Smoker  . Smokeless tobacco: Never Used  Substance and Sexual Activity  . Alcohol use: No  . Drug use: No  . Sexual activity: Yes    Birth control/protection: None  Lifestyle  . Physical activity:    Days per week: Not on file    Minutes per session: Not on file  . Stress: Not on file  Relationships  . Social connections:    Talks on phone: Not on file    Gets together: Not on file    Attends religious service: Not on file    Active member of club or organization: Not on file    Attends meetings of clubs or organizations: Not on file    Relationship status: Not on file  . Intimate partner violence:    Fear of current or ex partner: Not on file    Emotionally abused: Not on file    Physically abused: Not on file    Forced sexual activity: Not on file  Other Topics Concern  . Not on file  Social History Narrative  . Not on file   No current facility-administered medications on file prior to encounter.    Current Outpatient Medications on File Prior to Encounter  Medication Sig Dispense  Refill  . levothyroxine (SYNTHROID, LEVOTHROID) 125 MCG tablet Take 125 mcg by mouth 2 (two) times daily.   5  . NIFEdipine (PROCARDIA XL) 30 MG 24 hr tablet Take 1 tablet (30 mg total) by mouth daily. 30 tablet 5  . Prenat w/o A Vit-FeFum-FePo-FA (CONCEPT OB) 130-92.4-1 MG CAPS Take 1 tablet by mouth daily. 30 capsule 12  . promethazine (PHENERGAN) 25 MG tablet Take 1 tablet (25 mg total) by mouth every 6 (six) hours as needed. 30 tablet 2   Allergies  Allergen Reactions  . Hydrocodone Nausea And Vomiting    ROS:  Review of Systems  Constitutional: Negative for chills, fatigue and fever.  Respiratory: Negative for shortness of breath.   Cardiovascular: Negative for chest pain.  Genitourinary: Positive for vaginal discharge. Negative for difficulty urinating, dysuria, flank pain, pelvic pain, vaginal bleeding and vaginal  pain.  Neurological: Negative for dizziness and headaches.  Psychiatric/Behavioral: Negative.      I have reviewed patient's Past Medical Hx, Surgical Hx, Family Hx, Social Hx, medications and allergies.   Physical Exam   Patient Vitals for the past 24 hrs:  BP Temp Temp src Pulse Resp SpO2 Height Weight  12/29/17 1042 (!) 163/95 - - 87 - 99 % - -  12/29/17 1040 - 97.9 F (36.6 C) Oral - 18 - - -  12/29/17 0815 (!) 144/93 98.5 F (36.9 C) Oral 90 20 100 % 5\' 2"  (1.575 m) 257 lb (116.6 kg)   Constitutional: Well-developed, well-nourished female in no acute distress.  Cardiovascular: normal rate Respiratory: normal effort GI: Abd soft, non-tender. Pos BS x 4 MS: Extremities nontender, no edema, normal ROM Neurologic: Alert and oriented x 4.  GU: Neg CVAT.  PELVIC EXAM: Wet prep collected by blind swab  FHT not audible by doppler  LAB RESULTS Results for orders placed or performed during the hospital encounter of 12/29/17 (from the past 24 hour(s))  Urinalysis, Routine w reflex microscopic     Status: Abnormal   Collection Time: 12/29/17  8:10 AM  Result Value Ref Range   Color, Urine YELLOW YELLOW   APPearance HAZY (A) CLEAR   Specific Gravity, Urine 1.025 1.005 - 1.030   pH 5.0 5.0 - 8.0   Glucose, UA NEGATIVE NEGATIVE mg/dL   Hgb urine dipstick SMALL (A) NEGATIVE   Bilirubin Urine NEGATIVE NEGATIVE   Ketones, ur NEGATIVE NEGATIVE mg/dL   Protein, ur NEGATIVE NEGATIVE mg/dL   Nitrite NEGATIVE NEGATIVE   Leukocytes, UA NEGATIVE NEGATIVE   RBC / HPF 0-5 0 - 5 RBC/hpf   WBC, UA 0-5 0 - 5 WBC/hpf   Bacteria, UA NONE SEEN NONE SEEN   Squamous Epithelial / LPF 0-5 0 - 5   Mucus PRESENT    Hyaline Casts, UA PRESENT   Wet prep, genital     Status: Abnormal   Collection Time: 12/29/17 10:01 AM  Result Value Ref Range   Yeast Wet Prep HPF POC NONE SEEN NONE SEEN   Trich, Wet Prep NONE SEEN NONE SEEN   Clue Cells Wet Prep HPF POC NONE SEEN NONE SEEN   WBC, Wet Prep HPF  POC FEW (A) NONE SEEN   Sperm NONE SEEN     O/Positive/-- (05/15 1205)  IMAGING Pt informed that the ultrasound is considered a limited OB ultrasound and is not intended to be a complete ultrasound exam.  Patient also informed that the ultrasound is not being completed with the intent of assessing for fetal or placental anomalies  or any pelvic abnormalities.  Explained that the purpose of today's ultrasound is to assess for  viability.  Patient acknowledges the purpose of the exam and the limitations of the study.    Limited OB US Date: 12/29/17 EDD : 07/15/18 based on LMP Viability:  FHT detected CRL measurement c/w previous dates   MAU Management/MDM: Wet prep is normal today, and pt denies any exposure to STDs, with normal testing this month.  However, with pt symptoms and pelvic exam with some evidence, will treat for BV. Pt prefers Flagyl PO to Metrogel.  Rx sent to pharmacy.  Pt to office this week for NIPS and BP check with new medication.  Pt discharged with strict return precautions.  ASSESSMENT 1. BV (bacterial vaginosis)   2. Supervision of high risk pregnancy, antepartum   3. Chronic hypertension in pregnancy   4. History of cesarean delivery   5. [redacted] weeks gestation of pregnancy   6. Chronic hypertension during pregnancy, antepartum     PLAN Discharge home Allergies as of 12/29/2017      Reactions   Hydrocodone Nausea And Vomiting      Medication List    TAKE these medications   CONCEPT OB 130-92.4-1 MG Caps Take 1 tablet by mouth daily.   levothyroxine 125 MCG tablet Commonly known as:  SYNTHROID, LEVOTHROID Take 125 mcg by mouth 2 (two) times daily.   metroNIDAZOLE 500 MG tablet Commonly known as:  FLAGYL Take 1 tablet (500 mg total) by mouth 2 (two) times daily.   NIFEdipine 30 MG 24 hr tablet Commonly known as:  PROCARDIA XL Take 1 tablet (30 mg total) by mouth daily.   promethazine 25 MG tablet Commonly known as:  PHENERGAN Take 1 tablet (25 mg  total) by mouth every 6 (six) hours as needed.      Follow-up [place] for [facility] Follow up.   Specialty:  Obstetrics and Gynecology Why:  This week for genetic testing and blood pressure check. Return to MAU as needed for emergencies Contact information: [place] Certified Nurse-Midwife 12/29/2017  10:46 AM

## 2017-12-29 NOTE — MAU Note (Signed)
Pt presents with c/o possible BV infection.  States was seen here, given Rx for BV treatment, but didn't take meds because her primary OB instructed her not take med.  Reports currently having a white discharge with vaginal tingling.

## 2017-12-29 NOTE — Progress Notes (Signed)
Pt reports hasn't taken meds for CHTN this morning.

## 2017-12-29 NOTE — Progress Notes (Signed)
Bedside ultrasound done to verify FHT.

## 2017-12-30 ENCOUNTER — Encounter: Payer: Self-pay | Admitting: Family Medicine

## 2017-12-30 DIAGNOSIS — E66813 Obesity, class 3: Secondary | ICD-10-CM | POA: Insufficient documentation

## 2017-12-30 DIAGNOSIS — E039 Hypothyroidism, unspecified: Secondary | ICD-10-CM | POA: Insufficient documentation

## 2017-12-30 DIAGNOSIS — O99210 Obesity complicating pregnancy, unspecified trimester: Secondary | ICD-10-CM | POA: Insufficient documentation

## 2017-12-30 DIAGNOSIS — O99280 Endocrine, nutritional and metabolic diseases complicating pregnancy, unspecified trimester: Secondary | ICD-10-CM

## 2018-01-01 ENCOUNTER — Ambulatory Visit (INDEPENDENT_AMBULATORY_CARE_PROVIDER_SITE_OTHER): Payer: Medicaid Other

## 2018-01-01 ENCOUNTER — Other Ambulatory Visit: Payer: Self-pay

## 2018-01-01 VITALS — BP 156/102 | HR 98 | Wt 258.0 lb

## 2018-01-01 DIAGNOSIS — O10919 Unspecified pre-existing hypertension complicating pregnancy, unspecified trimester: Secondary | ICD-10-CM

## 2018-01-01 DIAGNOSIS — O0990 Supervision of high risk pregnancy, unspecified, unspecified trimester: Secondary | ICD-10-CM

## 2018-01-01 LAB — CYTOLOGY - PAP
ADEQUACY: ABSENT
CHLAMYDIA, DNA PROBE: NEGATIVE
DIAGNOSIS: NEGATIVE
HPV: NOT DETECTED
NEISSERIA GONORRHEA: NEGATIVE

## 2018-01-01 NOTE — Patient Instructions (Signed)
Common Medications Safe in Pregnancy  Acne:      Constipation:  Benzoyl Peroxide     Colace  Clindamycin      Dulcolax Suppository  Topica Erythromycin     Fibercon  Salicylic Acid      Metamucil         Miralax AVOID:        Senakot   Accutane    Cough:  Retin-A       Cough Drops  Tetracycline      Phenergan w/ Codeine if Rx  Minocycline      Robitussin (Plain & DM)  Antibiotics:     Crabs/Lice:  Ceclor       RID  Cephalosporins    AVOID:  E-Mycins      Kwell  Keflex  Macrobid/Macrodantin   Diarrhea:  Penicillin      Kao-Pectate  Zithromax      Imodium AD         PUSH FLUIDS AVOID:       Cipro     Fever:  Tetracycline      Tylenol (Regular or Extra  Minocycline       Strength)  Levaquin      Extra Strength-Do not          Exceed 8 tabs/24 hrs Caffeine:        <200mg/day (equiv. To 1 cup of coffee or  approx. 3 12 oz sodas)         Gas: Cold/Hayfever:       Gas-X  Benadryl      Mylicon  Claritin       Phazyme  **Claritin-D        Chlor-Trimeton    Headaches:  Dimetapp      ASA-Free Excedrin  Drixoral-Non-Drowsy     Cold Compress  Mucinex (Guaifenasin)     Tylenol (Regular or Extra  Sudafed/Sudafed-12 Hour     Strength)  **Sudafed PE Pseudoephedrine   Tylenol Cold & Sinus     Vicks Vapor Rub  Zyrtec  **AVOID if Problems With Blood Pressure         Heartburn: Avoid lying down for at least 1 hour after meals  Aciphex      Maalox     Rash:  Milk of Magnesia     Benadryl    Mylanta       1% Hydrocortisone Cream  Pepcid  Pepcid Complete   Sleep Aids:  Prevacid      Ambien   Prilosec       Benadryl  Rolaids       Chamomile Tea  Tums (Limit 4/day)     Unisom  Zantac       Tylenol PM         Warm milk-add vanilla or  Hemorrhoids:       Sugar for taste  Anusol/Anusol H.C.  (RX: Analapram 2.5%)  Sugar Substitutes:  Hydrocortisone OTC     Ok in moderation  Preparation H      Tucks        Vaseline lotion applied to tissue with  wiping    Herpes:     Throat:  Acyclovir      Oragel  Famvir  Valtrex     Vaccines:         Flu Shot Leg Cramps:       *Gardasil  Benadryl      Hepatitis A         Hepatitis B Nasal Spray:         Pneumovax  Saline Nasal Spray     Polio Booster         Tetanus Nausea:       Tuberculosis test or PPD  Vitamin B6 25 mg TID   AVOID:    Dramamine      *Gardasil  Emetrol       Live Poliovirus  Ginger Root 250 mg QID    MMR (measles, mumps &  High Complex Carbs @ Bedtime    rebella)  Sea Bands-Accupressure    Varicella (Chickenpox)  Unisom 1/2 tab TID     *No known complications           If received before Pain:         Known pregnancy;   Darvocet       Resume series after  Lortab        Delivery  Percocet    Yeast:   Tramadol      Femstat  Tylenol 3      Gyne-lotrimin  Ultram       Monistat  Vicodin           MISC:         All Sunscreens           Hair Coloring/highlights          Insect Repellant's          (Including DEET)         Mystic Tans  

## 2018-01-01 NOTE — Progress Notes (Signed)
Hypertension: Patient here for follow-up of elevated blood pressure. She is not exercising and is not adherent to low salt diet.  Blood pressure is not well controlled at home. Cardiac symptoms fatigue. Patient denies chest pain.  Cardiovascular risk factors: none. Use of agents associated with hypertension: none. History of target organ damage: none.

## 2018-01-06 ENCOUNTER — Encounter: Payer: Self-pay | Admitting: Family Medicine

## 2018-01-06 ENCOUNTER — Encounter: Payer: Self-pay | Admitting: *Deleted

## 2018-01-06 DIAGNOSIS — O0990 Supervision of high risk pregnancy, unspecified, unspecified trimester: Secondary | ICD-10-CM

## 2018-01-08 LAB — HEMOGLOBIN A1C
ESTIMATED AVERAGE GLUCOSE: 105 mg/dL
HEMOGLOBIN A1C: 5.3 % (ref 4.8–5.6)

## 2018-01-08 LAB — SPECIMEN STATUS REPORT

## 2018-01-13 ENCOUNTER — Encounter: Payer: Self-pay | Admitting: *Deleted

## 2018-01-15 ENCOUNTER — Encounter: Payer: Medicaid Other | Admitting: Obstetrics and Gynecology

## 2018-01-15 ENCOUNTER — Encounter: Payer: Self-pay | Admitting: Obstetrics and Gynecology

## 2018-01-16 NOTE — Progress Notes (Signed)
Patient did not keep return OB appointment for 01/15/2018.  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

## 2018-01-28 ENCOUNTER — Encounter (HOSPITAL_COMMUNITY): Payer: Self-pay | Admitting: *Deleted

## 2018-01-28 ENCOUNTER — Inpatient Hospital Stay (HOSPITAL_COMMUNITY)
Admission: AD | Admit: 2018-01-28 | Discharge: 2018-01-28 | Disposition: A | Discharge status: Home / Self Care | Payer: Medicaid Other | Source: Ambulatory Visit | Attending: Obstetrics and Gynecology | Admitting: Obstetrics and Gynecology

## 2018-01-28 DIAGNOSIS — O0990 Supervision of high risk pregnancy, unspecified, unspecified trimester: Secondary | ICD-10-CM

## 2018-01-28 DIAGNOSIS — O99210 Obesity complicating pregnancy, unspecified trimester: Secondary | ICD-10-CM

## 2018-01-28 DIAGNOSIS — O10919 Unspecified pre-existing hypertension complicating pregnancy, unspecified trimester: Secondary | ICD-10-CM

## 2018-01-28 DIAGNOSIS — E039 Hypothyroidism, unspecified: Secondary | ICD-10-CM

## 2018-01-28 DIAGNOSIS — O99280 Endocrine, nutritional and metabolic diseases complicating pregnancy, unspecified trimester: Secondary | ICD-10-CM

## 2018-01-28 DIAGNOSIS — K5900 Constipation, unspecified: Secondary | ICD-10-CM | POA: Diagnosis not present

## 2018-01-28 DIAGNOSIS — N898 Other specified noninflammatory disorders of vagina: Secondary | ICD-10-CM

## 2018-01-28 DIAGNOSIS — O0932 Supervision of pregnancy with insufficient antenatal care, second trimester: Secondary | ICD-10-CM

## 2018-01-28 DIAGNOSIS — O26892 Other specified pregnancy related conditions, second trimester: Secondary | ICD-10-CM | POA: Diagnosis not present

## 2018-01-28 DIAGNOSIS — Z3A16 16 weeks gestation of pregnancy: Secondary | ICD-10-CM | POA: Diagnosis not present

## 2018-01-28 DIAGNOSIS — Z98891 History of uterine scar from previous surgery: Secondary | ICD-10-CM

## 2018-01-28 DIAGNOSIS — O209 Hemorrhage in early pregnancy, unspecified: Secondary | ICD-10-CM | POA: Insufficient documentation

## 2018-01-28 LAB — URINALYSIS, ROUTINE W REFLEX MICROSCOPIC
Bilirubin Urine: NEGATIVE
GLUCOSE, UA: NEGATIVE mg/dL
Ketones, ur: NEGATIVE mg/dL
Leukocytes, UA: NEGATIVE
NITRITE: NEGATIVE
PROTEIN: NEGATIVE mg/dL
SPECIFIC GRAVITY, URINE: 1.021 (ref 1.005–1.030)
pH: 6 (ref 5.0–8.0)

## 2018-01-28 LAB — WET PREP, GENITAL
CLUE CELLS WET PREP: NONE SEEN
SPERM: NONE SEEN
Trich, Wet Prep: NONE SEEN
YEAST WET PREP: NONE SEEN

## 2018-01-28 NOTE — MAU Provider Note (Signed)
History     CSN: [id number]  Arrival date and time: 01/28/18 0946   First Provider Initiated Contact with Patient 01/28/18 1002      Chief Complaint  Patient presents with  . Vaginal Bleeding   HPI  [name] is a 32 y.o. G3P1011 at [redacted]w[redacted]d who presents to MAU with report of one episode of vaginal bleeding this morning at approximately 9:00am.   Vaginal bleeding New problem. Patient states she voided, saw a smear of dark brown vaginal discharge when she wiped, then came immediately to MAU. Denies frank red vaginal bleeding, leaking of fluid, fever, falls, or recent illness.  Denies sexual intercourse.  Constipation New complaint. Patient verbalized concern for constipation. States she has one bowel movement each day but "feels like things are slowing down". Reports PO hydration predominantly with Pepsi.'  Chronic Hypertension Patient took her Procardia this morning. States she is much happier with daily dosing.  OB History    Gravida  3   Para  1   Term  1   Preterm      AB  1   Living  1     SAB  1   TAB      Ectopic      Multiple  0   Live Births  1           Past Medical History:  Diagnosis Date  . Anxiety   . Cancer ([place]) 02/2012   thyroid cancer  . Hypertension   . Thyroid disease    cancer    Past Surgical History:  Procedure Laterality Date  . CESAREAN SECTION  2006  . DILATION AND EVACUATION N/A 02/07/2015   Procedure: DILATATION AND EVACUATION;  Surgeon: Lavonia Drafts, MD;  Location: [facility];  Service: Gynecology;  Laterality: N/A;  . THYROIDECTOMY  02/2012   total L side first and R side 7 days later.    Family History  Problem Relation Age of Onset  . Hypertension Mother   . Blindness Mother        one eye    Social History   Tobacco Use  . Smoking status: Never Smoker  . Smokeless tobacco: Never Used  Substance Use Topics  . Alcohol use: No  . Drug use: No    Allergies:  Allergies  Allergen Reactions   . Hydrocodone Nausea And Vomiting    Medications Prior to Admission  Medication Sig Dispense Refill Last Dose  . levothyroxine (SYNTHROID, LEVOTHROID) 125 MCG tablet Take 125 mcg by mouth 2 (two) times daily.   5 Taking  . metroNIDAZOLE (FLAGYL) 500 MG tablet Take 1 tablet (500 mg total) by mouth 2 (two) times daily. 14 tablet 0 Not Taking  . NIFEdipine (PROCARDIA XL) 30 MG 24 hr tablet Take 1 tablet (30 mg total) by mouth daily. 30 tablet 5 Taking  . Prenat w/o A Vit-FeFum-FePo-FA (CONCEPT OB) 130-92.4-1 MG CAPS Take 1 tablet by mouth daily. 30 capsule 12 Taking  . promethazine (PHENERGAN) 25 MG tablet Take 1 tablet (25 mg total) by mouth every 6 (six) hours as needed. 30 tablet 2 Taking    Review of Systems  Respiratory: Negative for shortness of breath.   Gastrointestinal: Negative for abdominal pain, nausea and vomiting.  Genitourinary: Positive for vaginal discharge. Negative for difficulty urinating, pelvic pain, vaginal bleeding and vaginal pain.  Neurological: Negative for headaches.   Physical Exam   Blood pressure 138/85, pulse 89, temperature 98.4 F (36.9 C), resp. rate 18, height  5\' 2"  (1.575 m), weight 262 lb (118.8 kg), last menstrual period 10/08/2017, unknown if currently breastfeeding.  Physical Exam  Nursing note and vitals reviewed. Constitutional: She is oriented to person, place, and time. She appears well-developed and well-nourished.  Cardiovascular: Normal rate, regular rhythm, normal heart sounds and intact distal pulses.  Respiratory: Effort normal and breath sounds normal.  GI: Soft. Bowel sounds are normal.  Fundus palpable at approximately 17cm  Genitourinary: Uterus normal. Vaginal discharge found.  Genitourinary Comments: Scant amount of brown discharge with predominantly off white mucus No foul smell  Musculoskeletal: Normal range of motion.  Neurological: She is alert and oriented to person, place, and time. She has normal reflexes.  Skin: Skin  is warm and dry.  Psychiatric: She has a normal mood and affect. Her behavior is normal. Judgment and thought content normal.   FHT 148 by Doppler  MAU Course  Procedures  MDM Orders Placed This Encounter  Procedures  . Wet prep, genital    Standing Status:   Standing    Number of Occurrences:   1  . Urinalysis, Routine w reflex microscopic    Standing Status:   Standing    Number of Occurrences:   1  . Discharge patient    Order Specific Question:   Discharge disposition    Answer:   01-Home or Self Care [1]    Order Specific Question:   Discharge patient date    Answer:   01/28/2018    Results for orders placed or performed during the hospital encounter of 01/28/18 (from the past 24 hour(s))  Wet prep, genital     Status: Abnormal   Collection Time: 01/28/18 10:12 AM  Result Value Ref Range   Yeast Wet Prep HPF POC NONE SEEN NONE SEEN   Trich, Wet Prep NONE SEEN NONE SEEN   Clue Cells Wet Prep HPF POC NONE SEEN NONE SEEN   WBC, Wet Prep HPF POC FEW (A) NONE SEEN   Sperm NONE SEEN   Urinalysis, Routine w reflex microscopic     Status: Abnormal   Collection Time: 01/28/18 10:30 AM  Result Value Ref Range   Color, Urine YELLOW YELLOW   APPearance HAZY (A) CLEAR   Specific Gravity, Urine 1.021 1.005 - 1.030   pH 6.0 5.0 - 8.0   Glucose, UA NEGATIVE NEGATIVE mg/dL   Hgb urine dipstick SMALL (A) NEGATIVE   Bilirubin Urine NEGATIVE NEGATIVE   Ketones, ur NEGATIVE NEGATIVE mg/dL   Protein, ur NEGATIVE NEGATIVE mg/dL   Nitrite NEGATIVE NEGATIVE   Leukocytes, UA NEGATIVE NEGATIVE   RBC / HPF 0-5 0 - 5 RBC/hpf   WBC, UA 0-5 0 - 5 WBC/hpf   Bacteria, UA RARE (A) NONE SEEN   Squamous Epithelial / LPF 0-5 0 - 5   Mucus PRESENT    Normal FHT No new bleeding visualized on exam Interrupted prenatal care  Constipation of pregnancy  Assessment and Plan  --[name] 32 y.o. [id number] at [redacted]w[redacted]d  --Normal vaginal discharge --Normal lab results --Discussed fiber, fruit  and water to minimize episodes of constipation --Follow up [facility] scheduled for July 10 at 10:35 at [facility] for missed appointment 01/15/2018 --Discharge home in stable condition  Darlina Rumpf, CNM 01/28/2018, 11:13 AM

## 2018-01-28 NOTE — MAU Note (Signed)
Pt reports she saw some blood when wiping today. Had some cramping yesterday but none today.

## 2018-01-28 NOTE — Discharge Instructions (Signed)
Second Trimester of Pregnancy The second trimester is from week 13 through week 28, month 4 through 6. This is often the time in pregnancy that you feel your best. Often times, morning sickness has lessened or quit. You may have more energy, and you may get hungry more often. Your unborn baby (fetus) is growing rapidly. At the end of the sixth month, he or she is about 9 inches long and weighs about 1 pounds. You will likely feel the baby move (quickening) between 18 and 20 weeks of pregnancy. Follow these instructions at home:  Avoid all smoking, herbs, and alcohol. Avoid drugs not approved by your doctor.  Do not use any tobacco products, including cigarettes, chewing tobacco, and electronic cigarettes. If you need help quitting, ask your doctor. You may get counseling or other support to help you quit.  Only take medicine as told by your doctor. Some medicines are safe and some are not during pregnancy.  Exercise only as told by your doctor. Stop exercising if you start having cramps.  Eat regular, healthy meals.  Wear a good support bra if your breasts are tender.  Do not use hot tubs, steam rooms, or saunas.  Wear your seat belt when driving.  Avoid raw meat, uncooked cheese, and liter boxes and soil used by cats.  Take your prenatal vitamins.  Take 1500-2000 milligrams of calcium daily starting at the 20th week of pregnancy until you deliver your baby.  Try taking medicine that helps you poop (stool softener) as needed, and if your doctor approves. Eat more fiber by eating fresh fruit, vegetables, and whole grains. Drink enough fluids to keep your pee (urine) clear or pale yellow.  Take warm water baths (sitz baths) to soothe pain or discomfort caused by hemorrhoids. Use hemorrhoid cream if your doctor approves.  If you have puffy, bulging veins (varicose veins), wear support hose. Raise (elevate) your feet for 15 minutes, 3-4 times a day. Limit salt in your diet.  Avoid heavy  lifting, wear low heals, and sit up straight.  Rest with your legs raised if you have leg cramps or low back pain.  Visit your dentist if you have not gone during your pregnancy. Use a soft toothbrush to brush your teeth. Be gentle when you floss.  You can have sex (intercourse) unless your doctor tells you not to.  Go to your doctor visits. Get help if:  You feel dizzy.  You have mild cramps or pressure in your lower belly (abdomen).  You have a nagging pain in your belly area.  You continue to feel sick to your stomach (nauseous), throw up (vomit), or have watery poop (diarrhea).  You have bad smelling fluid coming from your vagina.  You have pain with peeing (urination). Get help right away if:  You have a fever.  You are leaking fluid from your vagina.  You have spotting or bleeding from your vagina.  You have severe belly cramping or pain.  You lose or gain weight rapidly.  You have trouble catching your breath and have chest pain.  You notice sudden or extreme puffiness (swelling) of your face, hands, ankles, feet, or legs.  You have not felt the baby move in over an hour.  You have severe headaches that do not go away with medicine.  You have vision changes. This information is not intended to replace advice given to you by your health care provider. Make sure you discuss any questions you have with your health care   provider. Document Released: 10/15/2009 Document Revised: 12/27/2015 Document Reviewed: 09/21/2012 Elsevier Interactive Patient Education  2017 [employer] Inc[employer]  

## 2018-01-29 LAB — GC/CHLAMYDIA PROBE AMP (~~LOC~~) NOT AT ARMC
CHLAMYDIA, DNA PROBE: NEGATIVE
NEISSERIA GONORRHEA: NEGATIVE

## 2018-02-07 ENCOUNTER — Encounter (HOSPITAL_COMMUNITY): Payer: Self-pay | Admitting: Emergency Medicine

## 2018-02-07 ENCOUNTER — Emergency Department (HOSPITAL_COMMUNITY)
Admission: EM | Admit: 2018-02-07 | Discharge: 2018-02-07 | Disposition: A | Discharge status: Home / Self Care | Payer: Medicaid Other | Attending: Emergency Medicine | Admitting: Emergency Medicine

## 2018-02-07 DIAGNOSIS — Z79899 Other long term (current) drug therapy: Secondary | ICD-10-CM | POA: Insufficient documentation

## 2018-02-07 DIAGNOSIS — Z3A17 17 weeks gestation of pregnancy: Secondary | ICD-10-CM | POA: Insufficient documentation

## 2018-02-07 DIAGNOSIS — R109 Unspecified abdominal pain: Secondary | ICD-10-CM | POA: Diagnosis present

## 2018-02-07 DIAGNOSIS — I10 Essential (primary) hypertension: Secondary | ICD-10-CM | POA: Diagnosis not present

## 2018-02-07 DIAGNOSIS — M25552 Pain in left hip: Secondary | ICD-10-CM | POA: Diagnosis not present

## 2018-02-07 DIAGNOSIS — O9989 Other specified diseases and conditions complicating pregnancy, childbirth and the puerperium: Secondary | ICD-10-CM | POA: Diagnosis not present

## 2018-02-07 DIAGNOSIS — O99282 Endocrine, nutritional and metabolic diseases complicating pregnancy, second trimester: Secondary | ICD-10-CM | POA: Diagnosis not present

## 2018-02-07 DIAGNOSIS — Z85850 Personal history of malignant neoplasm of thyroid: Secondary | ICD-10-CM | POA: Insufficient documentation

## 2018-02-07 DIAGNOSIS — E039 Hypothyroidism, unspecified: Secondary | ICD-10-CM | POA: Diagnosis not present

## 2018-02-07 LAB — HCG, QUANTITATIVE, PREGNANCY: hCG, Beta Chain, Quant, S: 46425 m[IU]/mL — ABNORMAL HIGH (ref <5)

## 2018-02-07 LAB — BASIC METABOLIC PANEL
Anion gap: 11 (ref 5–15)
BUN: 8 mg/dL (ref 6–20)
CHLORIDE: 104 mmol/L (ref 98–111)
CO2: 18 mmol/L — AB (ref 22–32)
CREATININE: 0.68 mg/dL (ref 0.44–1.00)
Calcium: 9.3 mg/dL (ref 8.9–10.3)
GFR calc Af Amer: >60 mL/min (ref >60)
GFR calc non Af Amer: >60 mL/min (ref >60)
GLUCOSE: 92 mg/dL (ref 70–99)
Potassium: 3.3 mmol/L — ABNORMAL LOW (ref 3.5–5.1)
Sodium: 133 mmol/L — ABNORMAL LOW (ref 135–145)

## 2018-02-07 LAB — CBC
HEMATOCRIT: 34.4 % — AB (ref 36.0–46.0)
HEMOGLOBIN: 11.7 g/dL — AB (ref 12.0–15.0)
MCH: 30.9 pg (ref 26.0–34.0)
MCHC: 34 g/dL (ref 30.0–36.0)
MCV: 90.8 fL (ref 78.0–100.0)
Platelets: 248 10*3/uL (ref 150–400)
RBC: 3.79 MIL/uL — ABNORMAL LOW (ref 3.87–5.11)
RDW: 12.8 % (ref 11.5–15.5)
WBC: 14.4 10*3/uL — ABNORMAL HIGH (ref 4.0–10.5)
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
## 2018-02-07 NOTE — ED Notes (Signed)
MD did bedside US to verify baby

## 2018-02-07 NOTE — ED Notes (Signed)
Pt understood dc material. NAD noted. 
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
## 2018-02-10 ENCOUNTER — Encounter: Payer: Medicaid Other | Admitting: Obstetrics and Gynecology

## 2018-02-10 ENCOUNTER — Encounter: Payer: Self-pay | Admitting: Obstetrics and Gynecology

## 2018-02-10 NOTE — Progress Notes (Signed)
Patient did not keep her scheduled OB visit, she will be called to reschedule.  Feliz Beam, M.D. [facility] [place] 02/10/18 12:28 PM

## 2018-03-10 ENCOUNTER — Encounter: Payer: Medicaid Other | Admitting: Obstetrics & Gynecology

## 2018-03-12 ENCOUNTER — Encounter: Payer: Self-pay | Admitting: Family Medicine

## 2018-03-12 ENCOUNTER — Telehealth: Payer: Self-pay | Admitting: Family Medicine

## 2018-03-12 NOTE — Telephone Encounter (Signed)
Called patient to inform her of an appointment we scheduled for her. However, I was not able to get her on the phone. I will send a certified letter.

## 2018-03-15 ENCOUNTER — Encounter: Payer: Medicaid Other | Admitting: Family Medicine

## 2018-03-15 ENCOUNTER — Encounter: Payer: Self-pay | Admitting: Family Medicine

## 2018-03-15 NOTE — Progress Notes (Signed)
Patient did not keep appointment today. She will be called to reschedule.  

## 2018-03-25 ENCOUNTER — Encounter: Payer: Medicaid Other | Admitting: Obstetrics & Gynecology

## 2018-04-23 ENCOUNTER — Ambulatory Visit (INDEPENDENT_AMBULATORY_CARE_PROVIDER_SITE_OTHER): Payer: Medicaid Other | Admitting: Internal Medicine

## 2018-04-23 ENCOUNTER — Other Ambulatory Visit (HOSPITAL_COMMUNITY)
Admission: RE | Admit: 2018-04-23 | Discharge: 2018-04-23 | Disposition: A | Discharge status: Home / Self Care | Payer: Medicaid Other | Source: Ambulatory Visit | Attending: Family Medicine | Admitting: Family Medicine

## 2018-04-23 VITALS — BP 141/82 | HR 91 | Wt 260.1 lb

## 2018-04-23 DIAGNOSIS — O99280 Endocrine, nutritional and metabolic diseases complicating pregnancy, unspecified trimester: Secondary | ICD-10-CM

## 2018-04-23 DIAGNOSIS — N898 Other specified noninflammatory disorders of vagina: Secondary | ICD-10-CM

## 2018-04-23 DIAGNOSIS — O219 Vomiting of pregnancy, unspecified: Secondary | ICD-10-CM

## 2018-04-23 DIAGNOSIS — O0990 Supervision of high risk pregnancy, unspecified, unspecified trimester: Secondary | ICD-10-CM

## 2018-04-23 DIAGNOSIS — O10919 Unspecified pre-existing hypertension complicating pregnancy, unspecified trimester: Secondary | ICD-10-CM

## 2018-04-23 DIAGNOSIS — E039 Hypothyroidism, unspecified: Secondary | ICD-10-CM

## 2018-04-23 DIAGNOSIS — K5900 Constipation, unspecified: Secondary | ICD-10-CM

## 2018-04-23 LAB — POCT URINALYSIS DIP (DEVICE)
Glucose, UA: NEGATIVE mg/dL
Ketones, ur: 15 mg/dL — AB
Nitrite: NEGATIVE
PH: 6 (ref 5.0–8.0)
Protein, ur: NEGATIVE mg/dL
Urobilinogen, UA: 0.2 mg/dL (ref 0.0–1.0)

## 2018-04-23 MED ORDER — ASPIRIN EC 81 MG PO TBEC: 81.0000 mg | DELAYED_RELEASE_TABLET | Freq: Every day | ORAL | 1 refills | Status: DC

## 2018-04-23 MED ORDER — SENNOSIDES-DOCUSATE SODIUM 8.6-50 MG PO TABS: 1.0000 | ORAL_TABLET | Freq: Two times a day (BID) | ORAL | 1 refills | Status: DC | PRN

## 2018-04-23 MED ORDER — ONDANSETRON HCL 4 MG PO TABS: 4.0000 mg | ORAL_TABLET | Freq: Three times a day (TID) | ORAL | 0 refills | Status: DC | PRN
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
## 2018-04-23 NOTE — Patient Instructions (Addendum)
We have checked several labs for routine prenatal care, your high blood pressure, and your thyroid. We have also checked a swab for your vaginal irritation and will call you with the results. I have prescribed Zofran for nausea and a laxative/stool softener combo for your constipation.     [facility] [place] [address] [place], [place]  [address] Phone - [phone]   Fax - [phone]  [facility] [address] [place], [place] [address] Phone - [phone]   Fax - [place] [address] [place], [place]  [address] Phone - [phone]   Fax - [phone]  [place]. [address] [place] [address] [place], [place]  [address] Phone - [phone]   Fax - [phone]  [place] [address] [place], [place]  [address] Phone - [phone]   Fax - [phone]  [facility] [address] [place], [place]  [address] Phone - [phone]   Fax - [place] [address] [place], [place]  [address] Phone - [phone]   Fax - [phone]  [place] [address] [place], [place] [address] [place], [place]  [address] Phone - [phone]   Fax - [place] [address] [place], [place]  [address] Phone - [phone]   Fax - [phone] [facility] [place]. [address] [place], [place]  [address] Phone - [phone]   Fax - [phone]  [place] [address] [place], [place]  [address] Phone - [phone]   Fax - [phone]  [facility] AT [place], [place], [place]  [address] Phone - [phone]   Fax - [place] [address], [place], [place]   [address] Phone - [phone]   Fax - [phone]  [facility] [address] [place], [place]  [address] Phone - [place], [place]  [address] Phone - [phone]   Fax - [place] [address], [place], [place]  [address] Phone - [phone]   Fax - [phone]  [place] [address] [place], [place]  [address] Phone - [phone]   Fax - [place]. [place], [place]  [address] Phone - [phone]   Fax - [place], [place], [place]  [address] Phone - [phone]   Fax - [place] [address], [place], [place]  [address] Phone - [phone]   Fax - [phone]  [place] [place] [address], [place], [place]  [address] Phone - [phone]   Fax - [place], [place], [place]  [address] Phone - [phone]   Fax - [phone]  [place] [address] [place],   [address] Phone - [phone]   Fax - [phone] [place] [address] [place],   [address] Phone - [phone]   Fax - [place] [address] [place],   [address] Phone - [phone]   Fax - [place] [address]  [address] [place], [place]  [address] Phone - [phone]   Fax - [phone]  [place] MD [address] [place] [address] Phone [phone]  Fax [phone]

## 2018-04-24 LAB — RPR: RPR: NONREACTIVE

## 2018-04-24 LAB — PROTEIN / CREATININE RATIO, URINE
Creatinine, Urine: 262.7 mg/dL
PROTEIN/CREAT RATIO: 133 mg/g{creat} (ref 0–200)
Protein, Ur: 35 mg/dL

## 2018-04-24 LAB — HIV ANTIBODY (ROUTINE TESTING W REFLEX): HIV SCREEN 4TH GENERATION: NONREACTIVE

## 2018-04-24 LAB — T4, FREE: FREE T4: 0.47 ng/dL — AB (ref 0.82–1.77)

## 2018-04-24 LAB — COMPREHENSIVE METABOLIC PANEL
A/G RATIO: 1.6 (ref 1.2–2.2)
ALT: 11 IU/L (ref 0–32)
AST: 14 IU/L (ref 0–40)
Albumin: 3.6 g/dL (ref 3.5–5.5)
Alkaline Phosphatase: 63 IU/L (ref 39–117)
BUN/Creatinine Ratio: 9 (ref 9–23)
BUN: 6 mg/dL (ref 6–20)
Bilirubin Total: 0.5 mg/dL (ref 0.0–1.2)
CO2: 17 mmol/L — ABNORMAL LOW (ref 20–29)
Calcium: 8.6 mg/dL — ABNORMAL LOW (ref 8.7–10.2)
Chloride: 105 mmol/L (ref 96–106)
Creatinine, Ser: 0.68 mg/dL (ref 0.57–1.00)
GFR calc non Af Amer: 116 mL/min/{1.73_m2} (ref >59)
GFR, EST AFRICAN AMERICAN: 134 mL/min/{1.73_m2} (ref >59)
GLOBULIN, TOTAL: 2.2 g/dL (ref 1.5–4.5)
Glucose: 124 mg/dL — ABNORMAL HIGH (ref 65–99)
POTASSIUM: 3.5 mmol/L (ref 3.5–5.2)
SODIUM: 137 mmol/L (ref 134–144)
TOTAL PROTEIN: 5.8 g/dL — AB (ref 6.0–8.5)

## 2018-04-24 LAB — CBC
HEMATOCRIT: 31.4 % — AB (ref 34.0–46.6)
Hemoglobin: 10.9 g/dL — ABNORMAL LOW (ref 11.1–15.9)
MCH: 31.4 pg (ref 26.6–33.0)
MCHC: 34.7 g/dL (ref 31.5–35.7)
MCV: 91 fL (ref 79–97)
Platelets: 229 10*3/uL (ref 150–450)
RBC: 3.47 x10E6/uL — ABNORMAL LOW (ref 3.77–5.28)
RDW: 12.8 % (ref 12.3–15.4)
WBC: 9.4 10*3/uL (ref 3.4–10.8)

## 2018-04-24 LAB — TSH: TSH: 142.4 u[IU]/mL — ABNORMAL HIGH (ref 0.450–4.500)

## 2018-04-24 LAB — GLUCOSE TOLERANCE, 1 HOUR: Glucose, 1Hr PP: 108 mg/dL (ref 65–199)

## 2018-04-24 LAB — T3, FREE: T3 FREE: 1.2 pg/mL — AB (ref 2.0–4.4)

## 2018-04-26 ENCOUNTER — Other Ambulatory Visit: Payer: Self-pay | Admitting: Internal Medicine

## 2018-04-26 DIAGNOSIS — O99283 Endocrine, nutritional and metabolic diseases complicating pregnancy, third trimester: Principal | ICD-10-CM

## 2018-04-26 DIAGNOSIS — E039 Hypothyroidism, unspecified: Secondary | ICD-10-CM

## 2018-04-26 LAB — CERVICOVAGINAL ANCILLARY ONLY
Bacterial vaginitis: NEGATIVE
CHLAMYDIA, DNA PROBE: NEGATIVE
Candida vaginitis: POSITIVE — AB
NEISSERIA GONORRHEA: NEGATIVE
Trichomonas: NEGATIVE

## 2018-04-26 MED ORDER — LEVOTHYROXINE SODIUM 200 MCG PO TABS: 200.0000 ug | ORAL_TABLET | Freq: Every day | ORAL | 11 refills | Status: DC

## 2018-04-26 NOTE — Progress Notes (Signed)
Reviewed patients' labs from 28 week visit. Notable for significantly elevated TSH of 124. Patient currently on Synthroid 125 mcg BID. Reports poor compliance with second dosing most days. Will change to 200 mcg daily. I have sent this to the pharmacy. She has a ultrasound scheduled with MFM in two days on 9/25. Will also order thyroid ultrasound.   Please call MFM and ask for the patient to have an added consultation appointment with her ultrasound on 9/25.  Please call [facility] and ask that patient have an ASAP appointment due to third trimester of pregnancy with uncontrolled thyroid labs. Please call to schedule the thyroid ultrasound.   Please call patient to update her on the above and of the change in medication dosage.   Thanks,   Phill Myron, D.O. [facility] Family Medicine Fellow, [facility] for [facility], [facility] 04/26/2018, 11:49 AM

## 2018-04-27 ENCOUNTER — Other Ambulatory Visit: Payer: Self-pay | Admitting: *Deleted

## 2018-04-27 ENCOUNTER — Telehealth: Payer: Self-pay | Admitting: *Deleted

## 2018-04-27 DIAGNOSIS — O99283 Endocrine, nutritional and metabolic diseases complicating pregnancy, third trimester: Principal | ICD-10-CM

## 2018-04-27 DIAGNOSIS — E039 Hypothyroidism, unspecified: Secondary | ICD-10-CM

## 2018-04-27 NOTE — Telephone Encounter (Signed)
Called pt to inform her of her increased thyroid labs and dosage change on her medications.  Pt did not pick up and voicemail left instructing her to call back to go over her results.  Will send a MyChart message to the pt.  [facility] and they said that, d/t wanting to get the pt in ASAP, Dr. Thompson Caul nurse would contact the pt to work on getting her into the schedule.  Dr. Tamala Julian at their [facility] location is who she has seen in the past.    MFM does not have an open spot for a consult tomorrow, 9/25, but does report that the she will be seeing the doctor following her detailed ultrasound and he would make a referral.  Order for consult placed.

## 2018-04-28 ENCOUNTER — Ambulatory Visit (HOSPITAL_COMMUNITY): Admission: RE | Admit: 2018-04-28 | Payer: Medicaid Other | Source: Ambulatory Visit

## 2018-05-07 ENCOUNTER — Other Ambulatory Visit: Payer: Self-pay

## 2018-05-07 ENCOUNTER — Inpatient Hospital Stay (HOSPITAL_COMMUNITY)
Admission: AD | Admit: 2018-05-07 | Discharge: 2018-05-07 | Disposition: A | Discharge status: Home / Self Care | Payer: Medicaid Other | Source: Ambulatory Visit | Attending: Family Medicine | Admitting: Family Medicine

## 2018-05-07 ENCOUNTER — Ambulatory Visit (INDEPENDENT_AMBULATORY_CARE_PROVIDER_SITE_OTHER): Payer: Medicaid Other | Admitting: Obstetrics and Gynecology

## 2018-05-07 ENCOUNTER — Other Ambulatory Visit: Payer: Self-pay | Admitting: *Deleted

## 2018-05-07 ENCOUNTER — Encounter: Payer: Self-pay | Admitting: Obstetrics and Gynecology

## 2018-05-07 ENCOUNTER — Encounter: Payer: Self-pay | Admitting: General Practice

## 2018-05-07 ENCOUNTER — Encounter (HOSPITAL_COMMUNITY): Payer: Self-pay | Admitting: *Deleted

## 2018-05-07 ENCOUNTER — Telehealth: Payer: Self-pay | Admitting: *Deleted

## 2018-05-07 VITALS — BP 136/82 | HR 99 | Wt 260.8 lb

## 2018-05-07 DIAGNOSIS — R1011 Right upper quadrant pain: Secondary | ICD-10-CM | POA: Diagnosis present

## 2018-05-07 DIAGNOSIS — O10913 Unspecified pre-existing hypertension complicating pregnancy, third trimester: Secondary | ICD-10-CM | POA: Diagnosis not present

## 2018-05-07 DIAGNOSIS — Z3A30 30 weeks gestation of pregnancy: Secondary | ICD-10-CM

## 2018-05-07 DIAGNOSIS — O10919 Unspecified pre-existing hypertension complicating pregnancy, unspecified trimester: Secondary | ICD-10-CM

## 2018-05-07 DIAGNOSIS — O99280 Endocrine, nutritional and metabolic diseases complicating pregnancy, unspecified trimester: Secondary | ICD-10-CM

## 2018-05-07 DIAGNOSIS — G4489 Other headache syndrome: Secondary | ICD-10-CM | POA: Insufficient documentation

## 2018-05-07 DIAGNOSIS — Z98891 History of uterine scar from previous surgery: Secondary | ICD-10-CM

## 2018-05-07 DIAGNOSIS — O99210 Obesity complicating pregnancy, unspecified trimester: Secondary | ICD-10-CM

## 2018-05-07 DIAGNOSIS — O0990 Supervision of high risk pregnancy, unspecified, unspecified trimester: Secondary | ICD-10-CM

## 2018-05-07 DIAGNOSIS — O26893 Other specified pregnancy related conditions, third trimester: Secondary | ICD-10-CM | POA: Diagnosis not present

## 2018-05-07 DIAGNOSIS — Z3689 Encounter for other specified antenatal screening: Secondary | ICD-10-CM

## 2018-05-07 DIAGNOSIS — E039 Hypothyroidism, unspecified: Secondary | ICD-10-CM

## 2018-05-07 LAB — COMPREHENSIVE METABOLIC PANEL
ALBUMIN: 3.4 g/dL — AB (ref 3.5–5.0)
ALT: 17 U/L (ref 0–44)
ANION GAP: 9 (ref 5–15)
AST: 19 U/L (ref 15–41)
Alkaline Phosphatase: 59 U/L (ref 38–126)
BILIRUBIN TOTAL: 0.9 mg/dL (ref 0.3–1.2)
BUN: 9 mg/dL (ref 6–20)
CO2: 19 mmol/L — ABNORMAL LOW (ref 22–32)
Calcium: 8.6 mg/dL — ABNORMAL LOW (ref 8.9–10.3)
Chloride: 108 mmol/L (ref 98–111)
Creatinine, Ser: 0.68 mg/dL (ref 0.44–1.00)
GFR calc Af Amer: >60 mL/min (ref >60)
GFR calc non Af Amer: >60 mL/min (ref >60)
GLUCOSE: 106 mg/dL — AB (ref 70–99)
POTASSIUM: 3.3 mmol/L — AB (ref 3.5–5.1)
SODIUM: 136 mmol/L (ref 135–145)
Total Protein: 6.8 g/dL (ref 6.5–8.1)

## 2018-05-07 LAB — URINALYSIS, ROUTINE W REFLEX MICROSCOPIC
Bacteria, UA: NONE SEEN
Bilirubin Urine: NEGATIVE
GLUCOSE, UA: NEGATIVE mg/dL
Ketones, ur: NEGATIVE mg/dL
Nitrite: NEGATIVE
PH: 5 (ref 5.0–8.0)
Protein, ur: 30 mg/dL — AB
SPECIFIC GRAVITY, URINE: 1.029 (ref 1.005–1.030)

## 2018-05-07 LAB — PROTEIN / CREATININE RATIO, URINE
CREATININE, URINE: 395 mg/dL
PROTEIN CREATININE RATIO: 0.09 mg/mg{creat} (ref 0.00–0.15)
Total Protein, Urine: 35 mg/dL

## 2018-05-07 LAB — CBC
HEMATOCRIT: 30.1 % — AB (ref 36.0–46.0)
Hemoglobin: 10.6 g/dL — ABNORMAL LOW (ref 12.0–15.0)
MCH: 32.1 pg (ref 26.0–34.0)
MCHC: 35.2 g/dL (ref 30.0–36.0)
MCV: 91.2 fL (ref 78.0–100.0)
Platelets: 204 10*3/uL (ref 150–400)
RBC: 3.3 MIL/uL — ABNORMAL LOW (ref 3.87–5.11)
RDW: 13.4 % (ref 11.5–15.5)
WBC: 10 10*3/uL (ref 4.0–10.5)

## 2018-05-07 MED ORDER — GI COCKTAIL ~~LOC~~
30.0000 mL | Freq: Once | ORAL | Status: AC
  Administered 2018-05-07: 30 mL via ORAL
  Filled 2018-05-07: qty 30

## 2018-05-07 MED ORDER — MAGNESIUM OXIDE 400 (241.3 MG) MG PO TABS: 400.0000 mg | ORAL_TABLET | Freq: Every day | ORAL | 3 refills | Status: DC | PRN

## 2018-05-07 NOTE — Progress Notes (Signed)
Pt states started having bad headaches starting Wednesday, Took Tylenol ,went away but came back. Not sure on how many pills she can take per day with pregnancy. Pt also states having real bad cramps in upper right abdomen for a week now, she also had the same issue before pregnancy.

## 2018-05-07 NOTE — Telephone Encounter (Signed)
Pt has an appointment with Dr. Ilda Basset today.  Will inform her of new ultrasound appointment at that time.

## 2018-05-07 NOTE — Discharge Instructions (Signed)

## 2018-05-07 NOTE — Progress Notes (Signed)
EFM not tracing well.  FHR indeterminate

## 2018-05-07 NOTE — MAU Note (Signed)
Pt sent from clinic for BP eval.  Denies H/A or visual disturbances, but c/o epigastric pain. Reports had had H/A& visual disturbances within last 3 days. Denies LOF or VB.  Reports+FM.

## 2018-05-07 NOTE — Telephone Encounter (Signed)
-----   Message from Sloan Leiter, MD sent at 05/06/2018 12:54 PM EDT ----- Please reschedule patient's anatomy scan.

## 2018-05-07 NOTE — MAU Provider Note (Signed)
History     CSN: [id number]  Arrival date and time: 05/07/18 1054   Chief Complaint  Patient presents with  . BP Eval   G3P1011 @30 .1 wks sent from clinic for elevated BP. Denies HA, visual disturbances, SOB, and CP. Had HA earlier that resolved. Reports RUQ pain x1 week. Pain is worse when sitting. Describes as nagging. She thinks it might be from the baby moving. Reports daily heartburn. Doesn't take anything for it. Pt takes Labetalol inconsistently because it makes her too sedated   OB History    Gravida  3   Para  1   Term  1   Preterm      AB  1   Living  1     SAB  1   TAB      Ectopic      Multiple  0   Live Births  1           Past Medical History:  Diagnosis Date  . Anxiety   . Cancer ([place]) 02/2012   thyroid cancer  . Hypertension   . Thyroid disease    cancer    Past Surgical History:  Procedure Laterality Date  . CESAREAN SECTION  2006  . DILATION AND EVACUATION N/A 02/07/2015   Procedure: DILATATION AND EVACUATION;  Surgeon: Lavonia Drafts, MD;  Location: [facility];  Service: Gynecology;  Laterality: N/A;  . THYROIDECTOMY  02/2012   total L side first and R side 7 days later.    Family History  Problem Relation Age of Onset  . Hypertension Mother   . Blindness Mother        one eye    Social History   Tobacco Use  . Smoking status: Never Smoker  . Smokeless tobacco: Never Used  Substance Use Topics  . Alcohol use: No  . Drug use: No    Allergies:  Allergies  Allergen Reactions  . Hydrocodone Nausea And Vomiting    No medications prior to admission.    Review of Systems  Eyes: Negative for visual disturbance.  Gastrointestinal: Positive for abdominal pain.  Neurological: Negative for headaches.   Physical Exam   Blood pressure 131/84, pulse 96, temperature 97.9 F (36.6 C), temperature source Oral, resp. rate 20, height 5\' 2"  (1.575 m), weight 119.1 kg, last menstrual period 10/08/2017, SpO2 100 %, unknown  if currently breastfeeding. Patient Vitals for the past 24 hrs:  BP Temp Temp src Pulse Resp SpO2 Height Weight  05/07/18 1413 - 97.9 F (36.6 C) Oral - 20 - - -  05/07/18 1402 131/84 - - 96 - - - -  05/07/18 1346 121/87 - - 89 - - - -  05/07/18 1331 (!) 142/85 - - 89 - - - -  05/07/18 1315 (!) 144/92 - - 95 - - - -  05/07/18 1246 131/87 - - 93 - - - -  05/07/18 1231 134/88 - - 91 - - - -  05/07/18 1216 122/87 - - 94 - - - -  05/07/18 1201 (!) 144/96 - - 94 - - - -  05/07/18 1150 - - - - - 100 % - -  05/07/18 1146 (!) 163/108 - - 96 - - - -  05/07/18 1131 (!) 130/98 - - 99 - - - -  05/07/18 1122 (!) 148/103 98.1 F (36.7 C) Oral (!) 107 18 - - -  05/07/18 1110 - - - - - - 5\' 2"  (1.575 m) 119.1 kg  Physical Exam  Constitutional: She is oriented to person, place, and time. She appears well-developed and well-nourished. No distress.  HENT:  Head: Normocephalic and atraumatic.  Neck: Normal range of motion.  Cardiovascular: Normal rate.  Respiratory: Effort normal. No respiratory distress.  GI: Soft. She exhibits no distension. There is tenderness in the right upper quadrant.  gravid  Musculoskeletal: Normal range of motion. She exhibits no edema.  Neurological: She is alert and oriented to person, place, and time.  Skin: Skin is warm and dry.  Psychiatric: She has a normal mood and affect.  EFM: 145 bpm, mod variability, + accels, no decels Toco: none  Results for orders placed or performed during the hospital encounter of 05/07/18 (from the past 24 hour(s))  CBC     Status: Abnormal   Collection Time: 05/07/18 11:25 AM  Result Value Ref Range   WBC 10.0 4.0 - 10.5 K/uL   RBC 3.30 (L) 3.87 - 5.11 MIL/uL   Hemoglobin 10.6 (L) 12.0 - 15.0 g/dL   HCT 30.1 (L) 36.0 - 46.0 %   MCV 91.2 78.0 - 100.0 fL   MCH 32.1 26.0 - 34.0 pg   MCHC 35.2 30.0 - 36.0 g/dL   RDW 13.4 11.5 - 15.5 %   Platelets 204 150 - 400 K/uL  Comprehensive metabolic panel     Status: Abnormal    Collection Time: 05/07/18 11:25 AM  Result Value Ref Range   Sodium 136 135 - 145 mmol/L   Potassium 3.3 (L) 3.5 - 5.1 mmol/L   Chloride 108 98 - 111 mmol/L   CO2 19 (L) 22 - 32 mmol/L   Glucose, Bld 106 (H) 70 - 99 mg/dL   BUN 9 6 - 20 mg/dL   Creatinine, Ser 0.68 0.44 - 1.00 mg/dL   Calcium 8.6 (L) 8.9 - 10.3 mg/dL   Total Protein 6.8 6.5 - 8.1 g/dL   Albumin 3.4 (L) 3.5 - 5.0 g/dL   AST 19 15 - 41 U/L   ALT 17 0 - 44 U/L   Alkaline Phosphatase 59 38 - 126 U/L   Total Bilirubin 0.9 0.3 - 1.2 mg/dL   GFR calc non Af Amer >60 >60 mL/min   GFR calc Af Amer >60 >60 mL/min   Anion gap 9 5 - 15  Protein / creatinine ratio, urine     Status: None   Collection Time: 05/07/18  1:08 PM  Result Value Ref Range   Creatinine, Urine 395.00 mg/dL   Total Protein, Urine 35 mg/dL   Protein Creatinine Ratio 0.09 0.00 - 0.15 mg/mg[Cre]  Urinalysis, Routine w reflex microscopic     Status: Abnormal   Collection Time: 05/07/18  1:08 PM  Result Value Ref Range   Color, Urine YELLOW YELLOW   APPearance CLEAR CLEAR   Specific Gravity, Urine 1.029 1.005 - 1.030   pH 5.0 5.0 - 8.0   Glucose, UA NEGATIVE NEGATIVE mg/dL   Hgb urine dipstick MODERATE (A) NEGATIVE   Bilirubin Urine NEGATIVE NEGATIVE   Ketones, ur NEGATIVE NEGATIVE mg/dL   Protein, ur 30 (A) NEGATIVE mg/dL   Nitrite NEGATIVE NEGATIVE   Leukocytes, UA SMALL (A) NEGATIVE   RBC / HPF 0-5 0 - 5 RBC/hpf   WBC, UA 0-5 0 - 5 WBC/hpf   Bacteria, UA NONE SEEN NONE SEEN   Squamous Epithelial / LPF 0-5 0 - 5   Mucus PRESENT    MAU Course  Procedures  MDM Labs ordered and reviewed. No evidence of  PEC. BP stable now, encouraged pt to take Labetalol consistently. Stable for discharge home.  Assessment and Plan  [redacted] weeks gestation NST reactive Discharge home Follow up in [place] in 1 week PEC return precautions  Allergies as of 05/07/2018      Reactions   Hydrocodone Nausea And Vomiting      Medication List    STOP taking these  medications   lisinopril 10 MG tablet Commonly known as:  PRINIVIL,ZESTRIL   NIFEdipine 30 MG 24 hr tablet Commonly known as:  PROCARDIA-XL/ADALAT-CC/NIFEDICAL-XL   senna-docusate 8.6-50 MG tablet Commonly known as:  Senokot-S     TAKE these medications   aspirin EC 81 MG tablet Take 1 tablet (81 mg total) by mouth daily.   CONCEPT OB 130-92.4-1 MG Caps Take 1 tablet by mouth daily.   levothyroxine 200 MCG tablet Commonly known as:  SYNTHROID, LEVOTHROID Take 1 tablet (200 mcg total) by mouth daily.   magnesium oxide 400 (241.3 Mg) MG tablet Commonly known as:  MAG-OX Take 1 tablet (400 mg total) by mouth daily as needed.   ondansetron 4 MG tablet Commonly known as:  ZOFRAN Take 1 tablet (4 mg total) by mouth every 8 (eight) hours as needed for nausea or vomiting.   promethazine 25 MG tablet Commonly known as:  PHENERGAN Take 1 tablet (25 mg total) by mouth every 6 (six) hours as needed. What changed:  reasons to take this       Julianne Handler, CNM 05/07/2018, 2:28 PM

## 2018-05-07 NOTE — Progress Notes (Addendum)
Prenatal Visit Note Date: 05/07/2018 Clinic: [facility]  Subjective:  [name] is a 32 y.o. G3P1011 at [redacted]w[redacted]d being seen today for ongoing prenatal care.  She is currently monitored for the following issues for this high-risk pregnancy and has Hypertension; Postoperative hypothyroidism; Thyroid cancer ([place]); Hematuria; Supervision of high risk pregnancy, antepartum; Chronic hypertension in pregnancy; History of cesarean delivery; Status post complete thyroidectomy; Obesity, Class III, BMI 40-49.9 (morbid obesity) ([place]); Obesity affecting pregnancy, antepartum; and Hypothyroidism during pregnancy, antepartum on their problem list.  Patient reports:  HA that started a few days ago and she has been taking tylenol with some relief but always comes back. She denies any visual s/s.   RUQ that started about a week ago and constant. She states she had something similar in the remote past when she wasn't pregnant but nothing recently until this past week.  Contractions: Irritability. Vag. Bleeding: None.  Movement: Present. Denies leaking of fluid.   The following portions of the patient's history were reviewed and updated as appropriate: allergies, current medications, past family history, past medical history, past social history, past surgical history and problem list. Problem list updated.  Objective:   Vitals:   05/07/18 1014  BP: 136/82  Pulse: 99  Weight: 260 lb 12.8 oz (118.3 kg)    Fetal Status: Fetal Heart Rate (bpm): 135 Fundal Height: 30 cm Movement: Present     General:  Alert, oriented and cooperative. Patient is in no acute distress.  Skin: Skin is warm and dry. No rash noted.   Cardiovascular: Normal heart rate noted  Respiratory: Normal respiratory effort, no problems with respiration noted  Abdomen: Soft, gravid, appropriate for gestational age. Pain/Pressure: Present    Obese. No peritoneal s/s but pt does state it's ttp in the ruq   Pelvic:   Cervical exam deferred        Extremities: Normal range of motion.  Edema: Mild pitting, slight indentation  Mental Status: Normal mood and affect. Normal behavior. Normal judgment and thought content.   Urinalysis:      Assessment and Plan:  Pregnancy: G3P1011 at [redacted]w[redacted]d  1. Chronic hypertension in pregnancy Taking the procardia 30 qday and has been on that since may so unlikely to be causing her HAs. Start ap testing in two weeks. Pt has her anatomy u/s already scheduled  2. Other headache syndrome See above. B/c of the new onset HA and RUQ pain, even though her BPs are normal, I recommend she go to MAU for evaluation. I told her she can try mag ox 400 qday to help with HAs since she has constipation.   3. RUQ pain See above. Probably needs a ruq u/s at somet point if mau evaluation is negative  4. H/o total thyroidectomy Pt states she got a call from her endocrine office but hasn't called them back to make an appointment. Pt states she is taking the synthroid 125 bid and not 200 qday and that she didn't know she should take the 200 qday. At her last visit, it states she was taking the 125 qday and sometimes remembered to do it bid but pt confirms she is definitely taking it bid. If pt doesn't have w/u with endo prior to late September, recheck tsh around that time with. D/w her importance of correct thyroid supplementation given pregnancy and her history and maternal, fetal effects it can cause.   5. Pregnancy Ask about btl nv  6. H/o c-section Confirm she wants rpt nv  and if so schedule   Preterm labor symptoms and general obstetric precautions including but not limited to vaginal bleeding, contractions, leaking of fluid and fetal movement were reviewed in detail with the patient. Please refer to After Visit Summary for other counseling recommendations.  Return in about 1 week (around 05/14/2018) for hrob. 2wk hrob, bpp, nst.   Aletha Halim, MD

## 2018-05-07 NOTE — Telephone Encounter (Signed)
Anatomy scan rescheduled for 10/10 @ 9am.  Called pt to inform her of appointment but she did not pick up.  Voicemail left asking her to call the office to discuss her appointment.

## 2018-05-10 ENCOUNTER — Inpatient Hospital Stay (HOSPITAL_COMMUNITY)
Admission: AD | Admit: 2018-05-10 | Discharge: 2018-05-10 | Disposition: A | Discharge status: Home / Self Care | Payer: Medicaid Other | Source: Ambulatory Visit | Attending: Obstetrics & Gynecology | Admitting: Obstetrics & Gynecology

## 2018-05-10 ENCOUNTER — Encounter (HOSPITAL_COMMUNITY): Payer: Self-pay | Admitting: *Deleted

## 2018-05-10 ENCOUNTER — Other Ambulatory Visit: Payer: Self-pay

## 2018-05-10 ENCOUNTER — Telehealth: Payer: Self-pay | Admitting: Family Medicine

## 2018-05-10 ENCOUNTER — Encounter: Payer: Self-pay | Admitting: *Deleted

## 2018-05-10 ENCOUNTER — Other Ambulatory Visit: Payer: Self-pay | Admitting: *Deleted

## 2018-05-10 DIAGNOSIS — B3731 Acute candidiasis of vulva and vagina: Secondary | ICD-10-CM

## 2018-05-10 DIAGNOSIS — E079 Disorder of thyroid, unspecified: Secondary | ICD-10-CM | POA: Insufficient documentation

## 2018-05-10 DIAGNOSIS — Z9114 Patient's other noncompliance with medication regimen: Secondary | ICD-10-CM | POA: Insufficient documentation

## 2018-05-10 DIAGNOSIS — O10913 Unspecified pre-existing hypertension complicating pregnancy, third trimester: Secondary | ICD-10-CM | POA: Insufficient documentation

## 2018-05-10 DIAGNOSIS — O26893 Other specified pregnancy related conditions, third trimester: Secondary | ICD-10-CM

## 2018-05-10 DIAGNOSIS — Z7982 Long term (current) use of aspirin: Secondary | ICD-10-CM | POA: Insufficient documentation

## 2018-05-10 DIAGNOSIS — Z85850 Personal history of malignant neoplasm of thyroid: Secondary | ICD-10-CM | POA: Diagnosis not present

## 2018-05-10 DIAGNOSIS — Z79890 Hormone replacement therapy (postmenopausal): Secondary | ICD-10-CM | POA: Diagnosis not present

## 2018-05-10 DIAGNOSIS — Z3A30 30 weeks gestation of pregnancy: Secondary | ICD-10-CM | POA: Diagnosis not present

## 2018-05-10 DIAGNOSIS — O163 Unspecified maternal hypertension, third trimester: Secondary | ICD-10-CM | POA: Diagnosis not present

## 2018-05-10 DIAGNOSIS — R12 Heartburn: Secondary | ICD-10-CM | POA: Insufficient documentation

## 2018-05-10 DIAGNOSIS — Z79899 Other long term (current) drug therapy: Secondary | ICD-10-CM | POA: Insufficient documentation

## 2018-05-10 DIAGNOSIS — F419 Anxiety disorder, unspecified: Secondary | ICD-10-CM | POA: Insufficient documentation

## 2018-05-10 DIAGNOSIS — O99283 Endocrine, nutritional and metabolic diseases complicating pregnancy, third trimester: Secondary | ICD-10-CM | POA: Insufficient documentation

## 2018-05-10 DIAGNOSIS — O10919 Unspecified pre-existing hypertension complicating pregnancy, unspecified trimester: Secondary | ICD-10-CM | POA: Diagnosis present

## 2018-05-10 DIAGNOSIS — O99343 Other mental disorders complicating pregnancy, third trimester: Secondary | ICD-10-CM | POA: Insufficient documentation

## 2018-05-10 DIAGNOSIS — B373 Candidiasis of vulva and vagina: Secondary | ICD-10-CM

## 2018-05-10 LAB — COMPREHENSIVE METABOLIC PANEL
ALT: 15 U/L (ref 0–44)
AST: 17 U/L (ref 15–41)
Albumin: 3.4 g/dL — ABNORMAL LOW (ref 3.5–5.0)
Alkaline Phosphatase: 65 U/L (ref 38–126)
Anion gap: 9 (ref 5–15)
BILIRUBIN TOTAL: 0.7 mg/dL (ref 0.3–1.2)
BUN: 6 mg/dL (ref 6–20)
CO2: 20 mmol/L — ABNORMAL LOW (ref 22–32)
CREATININE: 0.67 mg/dL (ref 0.44–1.00)
Calcium: 8.7 mg/dL — ABNORMAL LOW (ref 8.9–10.3)
Chloride: 107 mmol/L (ref 98–111)
GFR calc Af Amer: >60 mL/min (ref >60)
Glucose, Bld: 91 mg/dL (ref 70–99)
POTASSIUM: 3.3 mmol/L — AB (ref 3.5–5.1)
Sodium: 136 mmol/L (ref 135–145)
TOTAL PROTEIN: 6.8 g/dL (ref 6.5–8.1)

## 2018-05-10 LAB — CBC
HCT: 29.2 % — ABNORMAL LOW (ref 36.0–46.0)
Hemoglobin: 10.4 g/dL — ABNORMAL LOW (ref 12.0–15.0)
MCH: 32.1 pg (ref 26.0–34.0)
MCHC: 35.6 g/dL (ref 30.0–36.0)
MCV: 90.1 fL (ref 78.0–100.0)
PLATELETS: 201 10*3/uL (ref 150–400)
RBC: 3.24 MIL/uL — ABNORMAL LOW (ref 3.87–5.11)
RDW: 13 % (ref 11.5–15.5)
WBC: 9.8 10*3/uL (ref 4.0–10.5)

## 2018-05-10 LAB — PROTEIN / CREATININE RATIO, URINE
CREATININE, URINE: 324 mg/dL
Protein Creatinine Ratio: 0.07 mg/mg{Cre} (ref 0.00–0.15)
Total Protein, Urine: 23 mg/dL

## 2018-05-10 MED ORDER — TERCONAZOLE 0.4 % VA CREA: 1.0000 | TOPICAL_CREAM | Freq: Every day | VAGINAL | 0 refills | Status: DC

## 2018-05-10 MED ORDER — FAMOTIDINE 20 MG PO TABS: 20.0000 mg | ORAL_TABLET | Freq: Two times a day (BID) | ORAL | 0 refills | Status: DC

## 2018-05-10 MED ORDER — ACETAMINOPHEN 500 MG PO TABS
1000.0000 mg | ORAL_TABLET | Freq: Once | ORAL | Status: AC
  Administered 2018-05-10: 1000 mg via ORAL
  Filled 2018-05-10: qty 2

## 2018-05-10 MED ORDER — GI COCKTAIL ~~LOC~~
30.0000 mL | Freq: Once | ORAL | Status: AC
  Administered 2018-05-10: 30 mL via ORAL
  Filled 2018-05-10: qty 30

## 2018-05-10 MED ORDER — NIFEDIPINE ER OSMOTIC RELEASE 30 MG PO TB24: 30.0000 mg | ORAL_TABLET | Freq: Every day | ORAL | 0 refills | Status: DC

## 2018-05-10 MED ORDER — LABETALOL HCL 100 MG PO TABS
300.0000 mg | ORAL_TABLET | Freq: Once | ORAL | Status: AC
  Administered 2018-05-10: 300 mg via ORAL
  Filled 2018-05-10: qty 3

## 2018-05-10 NOTE — MAU Note (Addendum)
Having really  Bad heartburn. (tums helps if she eats 3 or 4)  Wants her BP checked,to make sure it isn't up.   Denies HA, visual changes, or epigastric pain.  States is swollen

## 2018-05-10 NOTE — MAU Provider Note (Signed)
Chief Complaint:  Heartburn   First Provider Initiated Contact with Patient 05/10/18 1442     HPI: [name] is a 32 y.o. G3P1011 at [redacted]w[redacted]d who presents to maternity admissions reporting heartburn & hypertension. Pt with chronic hypertension. Per patient she is supposed to be taking labetalol but doesn't take it regularly d/t SE of drowsiness. States it makes her so drowsy that she can't go to work. Per chart review, pt thought to be on procardia but she is adamant that she has never taken that.  Has history of heartburn & RUQ with this pregnancy. Currently takes 4-5 doses of tums per day to help control her symptoms but they are becoming less effective.  Also reports headache this week. Was rx mag ox for her h/a's but didn't start taking b/c she didn't realize what the rx was for.  No visual disturbance & denies epigastric pain today.   Denies contractions, leakage of fluid or vaginal bleeding. Good fetal movement.  Location: chest/head Quality: burning/aching Severity: 3 (heartburn) 5 (head)/10 in pain scale Duration: 1 day/5 days Timing: constant Modifying factors: lying down makes heartburn worse. Nothing makes h/a worse. Tylenol has helped improve h/a but hasn't taken today.  Associated signs and symptoms: none  Pregnancy Course: Red chart d/t thyroidectomy for thyroid CA. CHTN.   Past Medical History:  Diagnosis Date  . Anxiety   . Cancer ([place]) 02/2012   thyroid cancer  . Hypertension   . Thyroid disease    cancer   OB History  Gravida Para Term Preterm AB Living  3 1 1   1 1   SAB TAB Ectopic Multiple Live Births  1     0 1    # Outcome Date GA Lbr Len/2nd Weight Sex Delivery Anes PTL Lv  3 Current           2 SAB 2017             Birth Comments: had d & C  1 Term 10/02/04 [redacted]w[redacted]d   F CS-Unspec   LIV     Birth Comments: c/s - not sure why- but thinks it was due to either baby hr or her hr   Past Surgical History:  Procedure Laterality Date  . CESAREAN SECTION   2006  . DILATION AND EVACUATION N/A 02/07/2015   Procedure: DILATATION AND EVACUATION;  Surgeon: Lavonia Drafts, MD;  Location: [facility];  Service: Gynecology;  Laterality: N/A;  . THYROIDECTOMY  02/2012   total L side first and R side 7 days later.   Family History  Problem Relation Age of Onset  . Hypertension Mother   . Blindness Mother        one eye   Social History   Tobacco Use  . Smoking status: Never Smoker  . Smokeless tobacco: Never Used  Substance Use Topics  . Alcohol use: No  . Drug use: No   Allergies  Allergen Reactions  . Hydrocodone Nausea And Vomiting   Medications Prior to Admission  Medication Sig Dispense Refill Last Dose  . aspirin EC 81 MG tablet Take 1 tablet (81 mg total) by mouth daily. 30 tablet 1 Taking  . levothyroxine (SYNTHROID) 200 MCG tablet Take 1 tablet (200 mcg total) by mouth daily. 30 tablet 11 Taking  . magnesium oxide (MAG-OX) 400 (241.3 Mg) MG tablet Take 1 tablet (400 mg total) by mouth daily as needed. 30 tablet 3   . ondansetron (ZOFRAN) 4 MG tablet Take 1 tablet (4 mg  total) by mouth every 8 (eight) hours as needed for nausea or vomiting. 20 tablet 0 Taking  . Prenat w/o A Vit-FeFum-FePo-FA (CONCEPT OB) 130-92.4-1 MG CAPS Take 1 tablet by mouth daily. (Patient not taking: Reported on 02/07/2018) 30 capsule 12 Not Taking  . promethazine (PHENERGAN) 25 MG tablet Take 1 tablet (25 mg total) by mouth every 6 (six) hours as needed. (Patient taking differently: Take 25 mg by mouth every 6 (six) hours as needed for nausea or vomiting. ) 30 tablet 2 Taking  . senna-docusate (SENOKOT-S) 8.6-50 MG tablet Take 1 tablet by mouth 2 (two) times daily as needed for mild constipation. 60 tablet 1 Taking    I have reviewed patient's Past Medical Hx, Surgical Hx, Family Hx, Social Hx, medications and allergies.   ROS:  Review of Systems  Constitutional: Negative.   Eyes: Negative for visual disturbance.  Respiratory: Negative for chest tightness  and shortness of breath.   Cardiovascular: Negative for chest pain and palpitations.  Gastrointestinal: Negative for abdominal pain, nausea and vomiting.       + heartburn  Genitourinary: Negative.   Neurological: Positive for headaches. Negative for dizziness.    Physical Exam   Patient Vitals for the past 24 hrs:  BP Temp Pulse Resp SpO2 Weight  05/10/18 1429 (!) 152/96 - 88 - - -  05/10/18 1313 (!) 157/103 98.2 F (36.8 C) 94 20 100 % 119.2 kg  05/10/18 1311 - - - - 100 % -    Constitutional: Well-developed, well-nourished female in no acute distress.  Cardiovascular: normal rate & rhythm, no murmur Respiratory: normal effort, lung sounds clear throughout GI: Abd soft, non-tender, gravid appropriate for gestational age. Pos BS x 4 MS: Extremities nontender, no edema, normal ROM Neurologic: Alert and oriented x 4. DTR 2+, no clonus.    NST:  Baseline: 145 bpm, Variability: Good {> 6 bpm), Accelerations: Reactive and Decelerations: Absent   Labs: Results for orders placed or performed during the hospital encounter of 05/10/18 (from the past 24 hour(s))  CBC     Status: Abnormal   Collection Time: 05/10/18  1:45 PM  Result Value Ref Range   WBC 9.8 4.0 - 10.5 K/uL   RBC 3.24 (L) 3.87 - 5.11 MIL/uL   Hemoglobin 10.4 (L) 12.0 - 15.0 g/dL   HCT 29.2 (L) 36.0 - 46.0 %   MCV 90.1 78.0 - 100.0 fL   MCH 32.1 26.0 - 34.0 pg   MCHC 35.6 30.0 - 36.0 g/dL   RDW 13.0 11.5 - 15.5 %   Platelets 201 150 - 400 K/uL  Comprehensive metabolic panel     Status: Abnormal   Collection Time: 05/10/18  1:45 PM  Result Value Ref Range   Sodium 136 135 - 145 mmol/L   Potassium 3.3 (L) 3.5 - 5.1 mmol/L   Chloride 107 98 - 111 mmol/L   CO2 20 (L) 22 - 32 mmol/L   Glucose, Bld 91 70 - 99 mg/dL   BUN 6 6 - 20 mg/dL   Creatinine, Ser 0.67 0.44 - 1.00 mg/dL   Calcium 8.7 (L) 8.9 - 10.3 mg/dL   Total Protein 6.8 6.5 - 8.1 g/dL   Albumin 3.4 (L) 3.5 - 5.0 g/dL   AST 17 15 - 41 U/L   ALT 15 0 -  44 U/L   Alkaline Phosphatase 65 38 - 126 U/L   Total Bilirubin 0.7 0.3 - 1.2 mg/dL   GFR calc non Af Amer >60 >60 mL/min  GFR calc Af Amer >60 >60 mL/min   Anion gap 9 5 - 15  Protein / creatinine ratio, urine     Status: None   Collection Time: 05/10/18  1:46 PM  Result Value Ref Range   Creatinine, Urine 324.00 mg/dL   Total Protein, Urine 23 mg/dL   Protein Creatinine Ratio 0.07 0.00 - 0.15 mg/mg[Cre]    Imaging:  No results found.  MAU Course: Orders Placed This Encounter  Procedures  . CBC  . Comprehensive metabolic panel  . Protein / creatinine ratio, urine  . ED EKG   Meds ordered this encounter  Medications  . gi cocktail (Maalox,Lidocaine,Donnatal)    MDM: Severe range BP x 1. Rest elevated but not severe. Tylenol improved headache. PEC labs negative. GI cocktail resolved heartburn.  Labetalol 300 mg PO given. Per Dr. Harolyn Rutherford will send home with labetalol.  After given labetalol, pt insists is causes drowsiness & she can't work while on it. C/w Dr. Rosana Hoes as Dr. Harolyn Rutherford gone for the day. Will switch to procardia 30 BID & have patient f/u later this week for BP check Assessment: 1. Chronic hypertension in pregnancy   2. Heartburn during pregnancy in third trimester   3. [redacted] weeks gestation of pregnancy     Plan: Discharge home in stable condition.  HTN precautions Start taking mag ox for h/a Pepcid for heartburn D/c labetalol & start procardia   Allergies as of 05/10/2018      Reactions   Hydrocodone Nausea And Vomiting      Medication List    STOP taking these medications   promethazine 25 MG tablet Commonly known as:  PHENERGAN   senna-docusate 8.6-50 MG tablet Commonly known as:  Senokot-S     TAKE these medications   acetaminophen 325 MG tablet Commonly known as:  TYLENOL Take 650 mg by mouth every 6 (six) hours as needed for mild pain or headache.   aspirin EC 81 MG tablet Take 1 tablet (81 mg total) by mouth daily.   CONCEPT OB  130-92.4-1 MG Caps Take 1 tablet by mouth daily.   famotidine 20 MG tablet Commonly known as:  PEPCID Take 1 tablet (20 mg total) by mouth 2 (two) times daily.   levothyroxine 125 MCG tablet Commonly known as:  SYNTHROID, LEVOTHROID Take 125 mcg by mouth 2 (two) times daily.   levothyroxine 200 MCG tablet Commonly known as:  SYNTHROID, LEVOTHROID Take 1 tablet (200 mcg total) by mouth daily.   magnesium oxide 400 (241.3 Mg) MG tablet Commonly known as:  MAG-OX Take 1 tablet (400 mg total) by mouth daily as needed.   NIFEdipine 30 MG 24 hr tablet Commonly known as:  PROCARDIA-XL/ADALAT-CC/NIFEDICAL-XL Take 1 tablet (30 mg total) by mouth daily.   ondansetron 4 MG tablet Commonly known as:  ZOFRAN Take 1 tablet (4 mg total) by mouth every 8 (eight) hours as needed for nausea or vomiting.     ASK your doctor about these medications   terconazole 0.4 % vaginal cream Commonly known as:  TERAZOL 7 Place 1 applicator vaginally at bedtime.       Jorje Guild, NP 05/10/2018 2:42 PM

## 2018-05-10 NOTE — Discharge Instructions (Signed)
Fetal Movement Counts Patient Name: ________________________________________________ Patient Due Date: ____________________ What is a fetal movement count? A fetal movement count is the number of times that you feel your baby move during a certain amount of time. This may also be called a fetal kick count. A fetal movement count is recommended for every pregnant woman. You may be asked to start counting fetal movements as early as week 28 of your pregnancy. Pay attention to when your baby is most active. You may notice your baby's sleep and wake cycles. You may also notice things that make your baby move more. You should do a fetal movement count:  When your baby is normally most active.  At the same time each day.  A good time to count movements is while you are resting, after having something to eat and drink. How do I count fetal movements? 1. Find a quiet, comfortable area. Sit, or lie down on your side. 2. Write down the date, the start time and stop time, and the number of movements that you felt between those two times. Take this information with you to your health care visits. 3. For 2 hours, count kicks, flutters, swishes, rolls, and jabs. You should feel at least 10 movements during 2 hours. 4. You may stop counting after you have felt 10 movements. 5. If you do not feel 10 movements in 2 hours, have something to eat and drink. Then, keep resting and counting for 1 hour. If you feel at least 4 movements during that hour, you may stop counting. Contact a health care provider if:  You feel fewer than 4 movements in 2 hours.  Your baby is not moving like he or she usually does. Date: ____________ Start time: ____________ Stop time: ____________ Movements: ____________ Date: ____________ Start time: ____________ Stop time: ____________ Movements: ____________ Date: ____________ Start time: ____________ Stop time: ____________ Movements: ____________ Date: ____________ Start time:  ____________ Stop time: ____________ Movements: ____________ Date: ____________ Start time: ____________ Stop time: ____________ Movements: ____________ Date: ____________ Start time: ____________ Stop time: ____________ Movements: ____________ Date: ____________ Start time: ____________ Stop time: ____________ Movements: ____________ Date: ____________ Start time: ____________ Stop time: ____________ Movements: ____________ Date: ____________ Start time: ____________ Stop time: ____________ Movements: ____________ This information is not intended to replace advice given to you by your health care provider. Make sure you discuss any questions you have with your health care provider. Document Released: 08/20/2006 Document Revised: 03/19/2016 Document Reviewed: 08/30/2015 Elsevier Interactive Patient Education  2018 [employer]. Heartburn During Pregnancy Heartburn is a type of pain or discomfort that can happen in the throat or chest. It is often described as a burning sensation. Heartburn is common during pregnancy because:  A hormone (progesterone) that is released during pregnancy may relax the valve (lower esophageal sphincter, or LES) that separates the esophagus from the stomach. This allows stomach acid to move up into the esophagus, causing heartburn.  The uterus gets larger and pushes up on the stomach, which pushes more acid into the esophagus. This is especially true in the later stages of pregnancy.  Heartburn usually goes away or gets better after giving birth. What are the causes? Heartburn is caused by stomach acid backing up into the esophagus (reflux). Reflux can be triggered by:  Changing hormone levels.  Large meals.  Certain foods and beverages, such as coffee, chocolate, onions, and peppermint.  Exercise.  Increased stomach acid production.  What increases the risk? You are more likely to experience heartburn during  pregnancy if you:  Had heartburn prior to  becoming pregnant.  Have been pregnant more than once before.  Are overweight or obese.  The likelihood that you will get heartburn also increases as you get farther along in your pregnancy, especially during the last trimester. What are the signs or symptoms? Symptoms of this condition include:  Burning pain in the chest or lower throat.  Bitter taste in the mouth.  Coughing.  Problems swallowing.  Vomiting.  Hoarse voice.  Asthma.  Symptoms may get worse when you lie down or bend over. Symptoms are often worse at night. How is this diagnosed? This condition is diagnosed based on:  Your medical history.  Your symptoms.  Blood tests to check for a certain type of bacteria associated with heartburn.  Whether taking heartburn medicine relieves your symptoms.  Examination of the stomach and esophagus using a tube with a light and camera on the end (endoscopy).  How is this treated? Treatment varies depending on how severe your symptoms are. Your health care provider may recommend:  Over-the-counter medicines (antacids or acid reducers) for mild heartburn.  Prescription medicines to decrease stomach acid or to protect your stomach lining.  Certain changes in your diet.  Raising the head of your bed so it is higher than the foot of the bed. This helps prevent stomach acid from backing up into the esophagus when you are lying down.  Follow these instructions at home: Eating and drinking  Do not drink alcohol during your pregnancy.  Identify foods and beverages that make your symptoms worse, and avoid them.  Beverages that you may want to avoid include: ? Coffee and tea (with or without caffeine). ? Energy drinks and sports drinks. ? Carbonated drinks or sodas. ? Citrus fruit juices.  Foods that you may want to avoid include: ? Chocolate and cocoa. ? Peppermint and mint flavorings. ? Garlic, onions, and horseradish. ? Spicy and acidic foods, including  peppers, chili powder, curry powder, vinegar, hot sauces, and barbecue sauce. ? Citrus fruits, such as oranges, lemons, and limes. ? Tomato-based foods, such as red sauce, chili, and salsa. ? Fried and fatty foods, such as donuts, french fries, potato chips, and high-fat dressings. ? High-fat meats, such as hot dogs, cold cuts, sausage, ham, and bacon. ? High-fat dairy items, such as whole milk, butter, and cheese.  Eat small, frequent meals instead of large meals.  Avoid drinking large amounts of liquid with your meals.  Avoid eating meals during the 2-3 hours before bedtime.  Avoid lying down right after you eat.  Do not exercise right after you eat. Medicines  Take over-the-counter and prescription medicines only as told by your health care provider.  Do not take aspirin, ibuprofen, or other NSAIDs unless your health care provider tells you to do that.  You may be instructed to avoid medicines that contain sodium bicarbonate. General instructions  If directed, raise the head of your bed about 6 inches (15 cm) by putting blocks under the legs. Sleeping with more pillows does not effectively relieve heartburn because it only changes the position of your head.  Do not use any products that contain nicotine or tobacco, such as cigarettes and e-cigarettes. If you need help quitting, ask your health care provider.  Wear loose-fitting clothing.  Try to reduce your stress, such as with yoga or meditation. If you need help managing stress, ask your health care provider.  Maintain a healthy weight. If you are overweight, work with your  health care provider to safely lose weight.  Keep all follow-up visits as told by your health care provider. This is important. Contact a health care provider if:  You develop new symptoms.  Your symptoms do not improve with treatment.  You have unexplained weight loss.  You have difficulty swallowing.  You make loud sounds when you breathe  (wheeze).  You have a cough that does not go away.  You have frequent heartburn for more than 2 weeks.  You have nausea or vomiting that does not get better with treatment.  You have pain in your abdomen. Get help right away if:  You have severe chest pain that spreads to your arm, neck, or jaw.  You feel sweaty, dizzy, or light-headed.  You have shortness of breath.  You have pain when swallowing.  You vomit, and your vomit looks like blood or coffee grounds.  Your stool is bloody or black. This information is not intended to replace advice given to you by your health care provider. Make sure you discuss any questions you have with your health care provider. Document Released: 07/18/2000 Document Revised: 04/07/2016 Document Reviewed: 04/07/2016 Elsevier Interactive Patient Education  2018 [employer].

## 2018-05-10 NOTE — MAU Provider Note (Signed)
History     CSN: [id number]  Arrival date and time: 05/10/18 1300   First Provider Initiated Contact with Patient 05/10/18 1442      Chief Complaint  Patient presents with  . Heartburn   Orva Cherry, is a G3P1011 with hx of CHTN, thyroid cancer s/p radiation (2018) at [redacted]w[redacted]d presenting to the MAU today for chest pain and heart burn which began this morning around 9am. Her pain is worse when lying down. She also endorses RUQ pain x1week, and H/A over the last few days. Her H/A was present on her last visit 3 days ago, when it was a 10/10 pain. Today its 5/10 on the left side of her head. She thinks her RUQ might be from baby moving. She denies visual changes, SOB. She has not been consistent with her labetalol, and just started taking it bid recently. She reports vomiting all day yesterday and is not sure how much of her medicine she's kept down. She would like to check to make sure her BP isn't too high.      Past Medical History:  Diagnosis Date  . Anxiety   . Cancer ([place]) 02/2012   thyroid cancer  . Hypertension   . Thyroid disease    cancer    Past Surgical History:  Procedure Laterality Date  . CESAREAN SECTION  2006  . DILATION AND EVACUATION N/A 02/07/2015   Procedure: DILATATION AND EVACUATION;  Surgeon: Lavonia Drafts, MD;  Location: [facility];  Service: Gynecology;  Laterality: N/A;  . THYROIDECTOMY  02/2012   total L side first and R side 7 days later.    Family History  Problem Relation Age of Onset  . Hypertension Mother   . Blindness Mother        one eye    Social History   Tobacco Use  . Smoking status: Never Smoker  . Smokeless tobacco: Never Used  Substance Use Topics  . Alcohol use: No  . Drug use: No    Allergies:  Allergies  Allergen Reactions  . Hydrocodone Nausea And Vomiting    Medications Prior to Admission  Medication Sig Dispense Refill Last Dose  . acetaminophen (TYLENOL) 325 MG tablet Take 650 mg by mouth every 6 (six)  hours as needed for mild pain or headache.   Past Week at Unknown time  . aspirin EC 81 MG tablet Take 1 tablet (81 mg total) by mouth daily. 30 tablet 1 05/09/2018 at Unknown time  . levothyroxine (SYNTHROID, LEVOTHROID) 125 MCG tablet Take 125 mcg by mouth 2 (two) times daily.     . ondansetron (ZOFRAN) 4 MG tablet Take 1 tablet (4 mg total) by mouth every 8 (eight) hours as needed for nausea or vomiting. 20 tablet 0 05/10/2018 at Unknown time  . levothyroxine (SYNTHROID) 200 MCG tablet Take 1 tablet (200 mcg total) by mouth daily. (Patient not taking: Reported on 05/10/2018) 30 tablet 11 Not Taking at Unknown time  . magnesium oxide (MAG-OX) 400 (241.3 Mg) MG tablet Take 1 tablet (400 mg total) by mouth daily as needed. (Patient not taking: Reported on 05/10/2018) 30 tablet 3 Not Taking at Unknown time  . Prenat w/o A Vit-FeFum-FePo-FA (CONCEPT OB) 130-92.4-1 MG CAPS Take 1 tablet by mouth daily. (Patient not taking: Reported on 02/07/2018) 30 capsule 12 Not Taking at Unknown time  . promethazine (PHENERGAN) 25 MG tablet Take 1 tablet (25 mg total) by mouth every 6 (six) hours as needed. (Patient not taking: Reported on 05/10/2018) 30  tablet 2 Not Taking at Unknown time  . senna-docusate (SENOKOT-S) 8.6-50 MG tablet Take 1 tablet by mouth 2 (two) times daily as needed for mild constipation. (Patient not taking: Reported on 05/10/2018) 60 tablet 1 Not Taking at Unknown time    Review of Systems  Constitutional: Negative for chills and fever.  HENT: Negative for congestion, rhinorrhea, sinus pain and sore throat.   Respiratory: Positive for chest tightness. Negative for cough.    Physical Exam   Blood pressure (!) 153/105, pulse 90, temperature 98.2 F (36.8 C), temperature source Oral, resp. rate 18, weight 119.2 kg, last menstrual period 10/08/2017, SpO2 98 %, unknown if currently breastfeeding.  Physical Exam  MAU Course  Procedures  MDM EKG ordered, found to be normal. Labs ordered and  reviewed, no evidence of PEC. BP initially elevated in office. 153/105, 158/108. Given dose of labetalol 300mg  in offi\cce. NST reactive  Assessment and Plan  Channing Clardy, is a G3P1011 at [redacted]w[redacted]d with CHTN, not controlled due to improper medication adherence  Stable on discharge - procardia 3mg  XL  Asriel Westrup 05/10/2018, 3:29 PM

## 2018-05-10 NOTE — Telephone Encounter (Signed)
Called and left patient a VM in regards to giving the office a call back to get her scheduled for a BP check sometime this week.

## 2018-05-13 ENCOUNTER — Encounter (HOSPITAL_COMMUNITY): Payer: Self-pay

## 2018-05-13 ENCOUNTER — Ambulatory Visit (HOSPITAL_COMMUNITY)
Admission: RE | Admit: 2018-05-13 | Discharge: 2018-05-13 | Disposition: A | Discharge status: Home / Self Care | Payer: Medicaid Other | Source: Ambulatory Visit | Attending: Family Medicine | Admitting: Family Medicine

## 2018-05-13 ENCOUNTER — Ambulatory Visit: Payer: Medicaid Other | Admitting: General Practice

## 2018-05-13 ENCOUNTER — Inpatient Hospital Stay (HOSPITAL_COMMUNITY)
Admission: AD | Admit: 2018-05-13 | Discharge: 2018-05-13 | Disposition: A | Discharge status: Home / Self Care | Payer: Medicaid Other | Source: Ambulatory Visit | Attending: Obstetrics & Gynecology | Admitting: Obstetrics & Gynecology

## 2018-05-13 ENCOUNTER — Inpatient Hospital Stay (HOSPITAL_COMMUNITY): Payer: Medicaid Other

## 2018-05-13 DIAGNOSIS — O99210 Obesity complicating pregnancy, unspecified trimester: Secondary | ICD-10-CM

## 2018-05-13 DIAGNOSIS — Z3A31 31 weeks gestation of pregnancy: Secondary | ICD-10-CM | POA: Insufficient documentation

## 2018-05-13 DIAGNOSIS — O0990 Supervision of high risk pregnancy, unspecified, unspecified trimester: Secondary | ICD-10-CM

## 2018-05-13 DIAGNOSIS — Z98891 History of uterine scar from previous surgery: Secondary | ICD-10-CM

## 2018-05-13 DIAGNOSIS — O34219 Maternal care for unspecified type scar from previous cesarean delivery: Secondary | ICD-10-CM | POA: Insufficient documentation

## 2018-05-13 DIAGNOSIS — Z7982 Long term (current) use of aspirin: Secondary | ICD-10-CM | POA: Insufficient documentation

## 2018-05-13 DIAGNOSIS — Z363 Encounter for antenatal screening for malformations: Secondary | ICD-10-CM | POA: Insufficient documentation

## 2018-05-13 DIAGNOSIS — O99280 Endocrine, nutritional and metabolic diseases complicating pregnancy, unspecified trimester: Secondary | ICD-10-CM

## 2018-05-13 DIAGNOSIS — I10 Essential (primary) hypertension: Secondary | ICD-10-CM

## 2018-05-13 DIAGNOSIS — E039 Hypothyroidism, unspecified: Secondary | ICD-10-CM

## 2018-05-13 DIAGNOSIS — O99283 Endocrine, nutritional and metabolic diseases complicating pregnancy, third trimester: Secondary | ICD-10-CM | POA: Insufficient documentation

## 2018-05-13 DIAGNOSIS — O10919 Unspecified pre-existing hypertension complicating pregnancy, unspecified trimester: Secondary | ICD-10-CM

## 2018-05-13 DIAGNOSIS — O99213 Obesity complicating pregnancy, third trimester: Secondary | ICD-10-CM | POA: Diagnosis not present

## 2018-05-13 DIAGNOSIS — R030 Elevated blood-pressure reading, without diagnosis of hypertension: Secondary | ICD-10-CM | POA: Diagnosis present

## 2018-05-13 DIAGNOSIS — O10013 Pre-existing essential hypertension complicating pregnancy, third trimester: Secondary | ICD-10-CM | POA: Diagnosis not present

## 2018-05-13 LAB — CBC
HCT: 29.9 % — ABNORMAL LOW (ref 36.0–46.0)
Hemoglobin: 10.8 g/dL — ABNORMAL LOW (ref 12.0–15.0)
MCH: 32.3 pg (ref 26.0–34.0)
MCHC: 36.1 g/dL — ABNORMAL HIGH (ref 30.0–36.0)
MCV: 89.5 fL (ref 80.0–100.0)
NRBC: 0 % (ref 0.0–0.2)
PLATELETS: 197 10*3/uL (ref 150–400)
RBC: 3.34 MIL/uL — ABNORMAL LOW (ref 3.87–5.11)
RDW: 12.8 % (ref 11.5–15.5)
WBC: 7.6 10*3/uL (ref 4.0–10.5)

## 2018-05-13 LAB — COMPREHENSIVE METABOLIC PANEL
ALK PHOS: 61 U/L (ref 38–126)
ALT: 16 U/L (ref 0–44)
ANION GAP: 9 (ref 5–15)
AST: 21 U/L (ref 15–41)
Albumin: 3.3 g/dL — ABNORMAL LOW (ref 3.5–5.0)
BILIRUBIN TOTAL: 0.9 mg/dL (ref 0.3–1.2)
BUN: 6 mg/dL (ref 6–20)
CALCIUM: 9 mg/dL (ref 8.9–10.3)
CO2: 22 mmol/L (ref 22–32)
Chloride: 106 mmol/L (ref 98–111)
Creatinine, Ser: 0.58 mg/dL (ref 0.44–1.00)
GFR calc non Af Amer: >60 mL/min (ref >60)
GLUCOSE: 92 mg/dL (ref 70–99)
POTASSIUM: 3.2 mmol/L — AB (ref 3.5–5.1)
Sodium: 137 mmol/L (ref 135–145)
TOTAL PROTEIN: 6.7 g/dL (ref 6.5–8.1)

## 2018-05-13 LAB — PROTEIN / CREATININE RATIO, URINE
Creatinine, Urine: 163 mg/dL
PROTEIN CREATININE RATIO: 0.1 mg/mg{creat} (ref 0.00–0.15)
TOTAL PROTEIN, URINE: 16 mg/dL

## 2018-05-13 MED ORDER — NIFEDIPINE ER OSMOTIC RELEASE 60 MG PO TB24: 60.0000 mg | ORAL_TABLET | Freq: Every day | ORAL | 1 refills | Status: DC

## 2018-05-13 MED ORDER — ADULT BLOOD PRESSURE CUFF LG KIT: 1.0000 | PACK | Freq: Every morning | 0 refills | Status: DC

## 2018-05-13 MED ORDER — ACETAMINOPHEN 500 MG PO TABS
1000.0000 mg | ORAL_TABLET | Freq: Once | ORAL | Status: AC
  Administered 2018-05-13: 1000 mg via ORAL
  Filled 2018-05-13: qty 2

## 2018-05-13 NOTE — MAU Note (Signed)
Sent from MFM, was to get detail [place] today.  Pt has CHTN, currently on Procardia. BP elevated. Has headache, blurring of vision yesterday.  Brought here for further eval

## 2018-05-13 NOTE — ED Notes (Signed)
Report called to J. Spurlock-Frizzell, RN, CN in MAU. Patient escorted to MAU registration.

## 2018-05-13 NOTE — ED Notes (Signed)
Dr. Yates Decamp given report of BP readings and sxs. MD called report to attending MD.

## 2018-05-13 NOTE — ED Notes (Addendum)
Patient states she has had HA's past few days that make her eyes water. States yesterday, she had blurred vision and dizziness. States she has had RUQ pain for about a month that has been evaluated by provider. BP elevated today X 2. Minimal edema in lowers exts.

## 2018-05-13 NOTE — Discharge Instructions (Signed)
Increase Procardia (nifedipine) from 30mg  daily to 60mg  daily starting today For headache, take 1,000mg  of tylenol (Two 500mg  tablets or three 325mg  tablets) Return to MAU for any signs of pre-eclampsia as outlined below or if blood pressures are elevated in the severe range (higher than 160/110)

## 2018-05-13 NOTE — MAU Provider Note (Signed)
History     CSN: [id number]  Arrival date and time: 05/13/18 1010   First Provider Initiated Contact with Patient 05/13/18 [phone]  Chief Complaint  Patient presents with  . Hypertension  . Headache   [name] presents after being sent down from [place] for elevated blood pressures.  She was scheduled for her anatomy US this morning for the third time, but unfortunately was not able to complete it this morning. She has a history of cHTN that was previously treated with labetalol with questionable adherence, per chart review. She reports that since being switched to procardia at her last MAU visit, she has been taking procardia every morning. The last dose was this morning at 7am. She reports intermittent adherence to prophylactic aspirin.   She reports daily headaches for 1 week. Today her headache was present when she woke up this morning.  She felt that she needed to avoid tight hairstyles due to the pain, which is all over her head. She reports new onset double vision this morning. She states that she's never had problems with her vision before, but this morning she's having difficulty focusing because she sees 2 of everything. She also has long-standing RUQ pain that she reports is unchanged from baseline. She denies epigastric pain.   Hypertension  This is a chronic problem. The current episode started more than 1 year ago. The problem is uncontrolled. Associated symptoms include blurred vision (double vision) and headaches. Agents associated with hypertension include thyroid hormones. Risk factors for coronary artery disease include family history and obesity. Past treatments include beta blockers and calcium channel blockers (labetalol discontinued due to fatigue).  Headache   This is a new problem. The current episode started 1 to 4 weeks ago. The problem occurs daily. The problem has been unchanged. Pain location: "whole head" The pain does not radiate. The pain is at a severity of  8/10. The pain is severe. Associated symptoms include blurred vision (double vision), dizziness and scalp tenderness. She has tried acetaminophen (tylenol temporarily relieves pain ) for the symptoms. Her past medical history is significant for hypertension and obesity.     Past Medical History:  Diagnosis Date  . Anxiety   . Cancer ([place]) 02/2012   thyroid cancer  . Hypertension   . Thyroid disease    cancer    Past Surgical History:  Procedure Laterality Date  . CESAREAN SECTION  2006  . DILATION AND EVACUATION N/A 02/07/2015   Procedure: DILATATION AND EVACUATION;  Surgeon: Lavonia Drafts, MD;  Location: [facility];  Service: Gynecology;  Laterality: N/A;  . THYROIDECTOMY  02/2012   total L side first and R side 7 days later.    Family History  Problem Relation Age of Onset  . Hypertension Mother   . Blindness Mother        one eye    Social History   Tobacco Use  . Smoking status: Never Smoker  . Smokeless tobacco: Never Used  Substance Use Topics  . Alcohol use: No  . Drug use: No    Allergies:  Allergies  Allergen Reactions  . Hydrocodone Nausea And Vomiting    Medications Prior to Admission  Medication Sig Dispense Refill Last Dose  . acetaminophen (TYLENOL) 325 MG tablet Take 650 mg by mouth every 6 (six) hours as needed for mild pain or headache.   Past Week at Unknown time  . aspirin EC 81 MG tablet Take 1 tablet (81 mg total) by mouth  daily. 30 tablet 1 05/12/2018 at Unknown time  . famotidine (PEPCID) 20 MG tablet Take 1 tablet (20 mg total) by mouth 2 (two) times daily. 60 tablet 0 05/12/2018 at Unknown time  . levothyroxine (SYNTHROID, LEVOTHROID) 125 MCG tablet Take 125 mcg by mouth 2 (two) times daily.   05/13/2018 at Unknown time  . NIFEdipine (PROCARDIA-XL/ADALAT-CC/NIFEDICAL-XL) 30 MG 24 hr tablet Take 1 tablet (30 mg total) by mouth daily. 30 tablet 0 05/13/2018 at Unknown time  . ondansetron (ZOFRAN) 4 MG tablet Take 1 tablet (4 mg total) by  mouth every 8 (eight) hours as needed for nausea or vomiting. 20 tablet 0 Past Week at Unknown time  . Prenat w/o A Vit-FeFum-FePo-FA (CONCEPT OB) 130-92.4-1 MG CAPS Take 1 tablet by mouth daily. 30 capsule 12 05/12/2018 at Unknown time  . magnesium oxide (MAG-OX) 400 (241.3 Mg) MG tablet Take 1 tablet (400 mg total) by mouth daily as needed. (Patient not taking: Reported on 05/10/2018) 30 tablet 3 Not Taking  . terconazole (TERAZOL 7) 0.4 % vaginal cream Place 1 applicator vaginally at bedtime. (Patient not taking: Reported on 05/13/2018) 45 g 0 Not Taking at Unknown time    Review of Systems  Eyes: Positive for blurred vision (double vision).  Neurological: Positive for dizziness, light-headedness and headaches.  Psychiatric/Behavioral: Positive for dysphoric mood.  All other systems reviewed and are negative.  Physical Exam   Blood pressure (!) 159/103, pulse 96, temperature 98.9 F (37.2 C), temperature source Oral, resp. rate 16, last menstrual period 10/08/2017, unknown if currently breastfeeding.  Physical Exam  Constitutional: She is oriented to person, place, and time. She appears well-developed and well-nourished. No distress.  HENT:  Head: Normocephalic and atraumatic.  Eyes: Pupils are equal, round, and reactive to light. Conjunctivae are normal. Right eye exhibits no discharge. Left eye exhibits no discharge. No scleral icterus.  Difficulty following object to evaluate EOM, which pt attributes to current double vision  Cardiovascular: Regular rhythm and normal heart sounds. Exam reveals no gallop and no friction rub.  No murmur heard. Regular tachycardia  Respiratory: Effort normal and breath sounds normal. No respiratory distress. She has no wheezes. She has no rales.  GI: Soft. Bowel sounds are normal. She exhibits no distension. There is no tenderness. There is no rebound and no guarding.  gravid  Neurological: She is alert and oriented to person, place, and time. No  cranial nerve deficit.  Normal gait. Difficulty following object for evaluation of EOM, which pt attributes to current double vision.   Skin: Skin is warm and dry. She is not diaphoretic.  Psychiatric: She has a normal mood and affect. Her behavior is normal. Judgment and thought content normal.    MAU Course  Procedures  MDM After initial bedside evaluation, patient was treated with tylenol 1,000mg  PO x 1 Reevaluated about an hour later and pt reports improvement of headache and double vision After a long discussion about possible barriers to adherence to treatment, patient is agreeable to increasing procardia dose with once daily dosing and home monitoring of blood pressure She is encouraged to follow up at scheduled appointment on Monday 10/4 in clinic Anatomy US with MFM was rescheduled for after her visit on Monday  Assessment and Plan  Chronic HTN affecting pregnancy - at this time patient does not meet criteria for pre-e - HA resolved after tylenol - Double vision also resolved after tylenol therefore likely 2/2 HA - Given uncontrolled HTN, will increase procardia from 30mg  to 60mg   daily; pt reports better adherence with once daily dosing - Start home blood pressure monitoring daily; BP cuff prescribed - Continue aspirin 81mg  daily - Return/ER precautions discussed - Follow up PRN or as scheduled  Aura Camps 05/13/2018, 11:12 AM

## 2018-05-13 NOTE — MAU Note (Signed)
Urine sent to lab 

## 2018-05-13 NOTE — MAU Note (Signed)
Pt sent from clinic for HTN and headache. States it's an 8/10, but was not able to take anything today because she had a 9am appointment. States she also feels dizzy.

## 2018-05-17 ENCOUNTER — Ambulatory Visit (HOSPITAL_COMMUNITY)
Admit: 2018-05-17 | Discharge: 2018-05-17 | Disposition: A | Discharge status: Home / Self Care | Payer: Medicaid Other | Attending: Family Medicine | Admitting: Family Medicine

## 2018-05-17 ENCOUNTER — Ambulatory Visit (INDEPENDENT_AMBULATORY_CARE_PROVIDER_SITE_OTHER): Payer: Medicaid Other | Admitting: Family Medicine

## 2018-05-17 ENCOUNTER — Other Ambulatory Visit: Payer: Medicaid Other

## 2018-05-17 ENCOUNTER — Other Ambulatory Visit (HOSPITAL_COMMUNITY): Payer: Self-pay | Admitting: *Deleted

## 2018-05-17 ENCOUNTER — Encounter (HOSPITAL_COMMUNITY): Payer: Self-pay

## 2018-05-17 VITALS — BP 165/92 | HR 92 | Wt 258.7 lb

## 2018-05-17 DIAGNOSIS — O0990 Supervision of high risk pregnancy, unspecified, unspecified trimester: Secondary | ICD-10-CM

## 2018-05-17 DIAGNOSIS — O34219 Maternal care for unspecified type scar from previous cesarean delivery: Secondary | ICD-10-CM

## 2018-05-17 DIAGNOSIS — O99283 Endocrine, nutritional and metabolic diseases complicating pregnancy, third trimester: Secondary | ICD-10-CM | POA: Insufficient documentation

## 2018-05-17 DIAGNOSIS — K5901 Slow transit constipation: Secondary | ICD-10-CM

## 2018-05-17 DIAGNOSIS — O10913 Unspecified pre-existing hypertension complicating pregnancy, third trimester: Secondary | ICD-10-CM

## 2018-05-17 DIAGNOSIS — E039 Hypothyroidism, unspecified: Secondary | ICD-10-CM | POA: Diagnosis not present

## 2018-05-17 DIAGNOSIS — O10919 Unspecified pre-existing hypertension complicating pregnancy, unspecified trimester: Secondary | ICD-10-CM

## 2018-05-17 DIAGNOSIS — O99213 Obesity complicating pregnancy, third trimester: Secondary | ICD-10-CM | POA: Insufficient documentation

## 2018-05-17 DIAGNOSIS — O99210 Obesity complicating pregnancy, unspecified trimester: Secondary | ICD-10-CM

## 2018-05-17 DIAGNOSIS — O10013 Pre-existing essential hypertension complicating pregnancy, third trimester: Secondary | ICD-10-CM | POA: Insufficient documentation

## 2018-05-17 DIAGNOSIS — O99280 Endocrine, nutritional and metabolic diseases complicating pregnancy, unspecified trimester: Secondary | ICD-10-CM

## 2018-05-17 DIAGNOSIS — Z98891 History of uterine scar from previous surgery: Secondary | ICD-10-CM

## 2018-05-17 DIAGNOSIS — Z3A31 31 weeks gestation of pregnancy: Secondary | ICD-10-CM | POA: Diagnosis not present

## 2018-05-17 DIAGNOSIS — Z363 Encounter for antenatal screening for malformations: Secondary | ICD-10-CM

## 2018-05-17 DIAGNOSIS — N611 Abscess of the breast and nipple: Secondary | ICD-10-CM

## 2018-05-17 MED ORDER — NIFEDIPINE ER 90 MG PO TB24: 90.0000 mg | ORAL_TABLET | Freq: Every day | ORAL | 1 refills | Status: DC

## 2018-05-17 MED ORDER — LEVOTHYROXINE SODIUM 200 MCG PO TABS: 200.0000 ug | ORAL_TABLET | Freq: Two times a day (BID) | ORAL | 1 refills | Status: DC

## 2018-05-17 MED ORDER — SENNOSIDES-DOCUSATE SODIUM 8.6-50 MG PO TABS: 1.0000 | ORAL_TABLET | Freq: Two times a day (BID) | ORAL | 1 refills | Status: DC

## 2018-05-17 MED ORDER — SULFAMETHOXAZOLE-TRIMETHOPRIM 800-160 MG PO TABS: 1.0000 | ORAL_TABLET | Freq: Two times a day (BID) | ORAL | 0 refills | Status: DC

## 2018-05-17 MED ORDER — DOCUSATE SODIUM 100 MG PO CAPS: 100.0000 mg | ORAL_CAPSULE | Freq: Two times a day (BID) | ORAL | 2 refills | Status: DC | PRN

## 2018-05-17 NOTE — Progress Notes (Signed)
PRENATAL VISIT NOTE  Subjective:  [name] is a 32 y.o. G3P1011 at [redacted]w[redacted]d being seen today for ongoing prenatal care.  She is currently monitored for the following issues for this high-risk pregnancy and has Hypertension; Postoperative hypothyroidism; Thyroid cancer ([place]); Hematuria; Supervision of high risk pregnancy, antepartum; Chronic hypertension in pregnancy; History of cesarean delivery; Status post complete thyroidectomy; Obesity, Class III, BMI 40-49.9 (morbid obesity) ([place]); Obesity affecting pregnancy, antepartum; Hypothyroidism during pregnancy, antepartum; RUQ pain; and Other headache syndrome on their problem list.  Patient reports constipation, fatigue, abdominal pain related to constipation and boil on breast.  Contractions: Not present. Vag. Bleeding: None.  Movement: Present. Denies leaking of fluid.   The following portions of the patient's history were reviewed and updated as appropriate: allergies, current medications, past family history, past medical history, past social history, past surgical history and problem list. Problem list updated.  Objective:   Vitals:   05/17/18 0914 05/17/18 0916  BP: (!) 157/106 (!) 165/92  Pulse: 92 92  Weight: 258 lb 11.2 oz (117.3 kg)     Fetal Status: Fetal Heart Rate (bpm): 140 Fundal Height: 30 cm Movement: Present     General:  Alert, oriented and cooperative. Patient is in no acute distress.  Skin: Skin is warm and dry. On left breast there is boil which is draining purulent drainage..   Cardiovascular: Normal heart rate noted  Respiratory: Normal respiratory effort, no problems with respiration noted  Abdomen: Soft, gravid, appropriate for gestational age.  Pain/Pressure: Present     Pelvic: Cervical exam deferred        Extremities: Normal range of motion.  Edema: None  Mental Status: Normal mood and affect. Normal behavior. Normal judgment and thought content.   Assessment and Plan:  Pregnancy: G3P1011 at  [redacted]w[redacted]d  1. Hypothyroidism during pregnancy, antepartum Cannot remember bid dosing--will start with 200 mcg daily and know we may need to increase--re check in 4 wks - levothyroxine (SYNTHROID, LEVOTHROID) 200 MCG tablet; Take 1 tablet (200 mcg total) by mouth 2 (two) times daily.  Dispense: 90 tablet; Refill: 1  2. Chronic hypertension in pregnancy Still elevated, but non-pregnant was much higher in ED in 10-06/2017 (168/110, 198/134, 161/98) Will increase meds--last w/u in MAU 4 days ago was negative. - NIFEdipine (ADALAT CC) 90 MG 24 hr tablet; Take 1 tablet (90 mg total) by mouth daily.  Dispense: 30 tablet; Refill: 1  3. Supervision of high risk pregnancy, antepartum Still hasn't had anatomy US  4. History of cesarean delivery Wants repeat--currently scheduled at 39 wks--advised what would make [place] deliver her sooner  5. Constipation by delayed colonic transit Exacerbated by poorly controlled hypothryoid, zofran use, pregnancy--improve bowel regimen - senna-docusate (SENOKOT-S) 8.6-50 MG tablet; Take 1 tablet by mouth 2 (two) times daily.  Dispense: 60 tablet; Refill: 1 - docusate sodium (COLACE) 100 MG capsule; Take 1 capsule (100 mg total) by mouth 2 (two) times daily as needed.  Dispense: 30 capsule; Refill: 2  6. Boil, breast Warm compresses and begin Abx. - sulfamethoxazole-trimethoprim (BACTRIM DS,SEPTRA DS) 800-160 MG tablet; Take 1 tablet by mouth 2 (two) times daily.  Dispense: 10 tablet; Refill: 0  Preterm labor symptoms and general obstetric precautions including but not limited to vaginal bleeding, contractions, leaking of fluid and fetal movement were reviewed in detail with the patient. Please refer to After Visit Summary for other counseling recommendations.  Return in 1 week (on 05/24/2018) for needs MD, [place], OB visit and BPP.  Future Appointments  Date Time Provider Department Center  05/26/2018  9:35 AM Donnamae Jude, MD [facility]    Donnamae Jude, MD

## 2018-05-17 NOTE — Patient Instructions (Signed)

## 2018-05-18 ENCOUNTER — Emergency Department (HOSPITAL_COMMUNITY)
Admission: EM | Admit: 2018-05-18 | Discharge: 2018-05-18 | Discharge status: Against Medical Advice | Payer: Medicaid Other | Attending: Emergency Medicine | Admitting: Emergency Medicine

## 2018-05-18 ENCOUNTER — Inpatient Hospital Stay (EMERGENCY_DEPARTMENT_HOSPITAL)
Admission: AD | Admit: 2018-05-18 | Discharge: 2018-05-18 | Disposition: A | Discharge status: Home / Self Care | Payer: Medicaid Other | Source: Ambulatory Visit | Attending: Family Medicine | Admitting: Family Medicine

## 2018-05-18 ENCOUNTER — Encounter (HOSPITAL_COMMUNITY): Payer: Self-pay | Admitting: Emergency Medicine

## 2018-05-18 ENCOUNTER — Encounter (HOSPITAL_COMMUNITY): Payer: Self-pay

## 2018-05-18 DIAGNOSIS — I10 Essential (primary) hypertension: Secondary | ICD-10-CM | POA: Insufficient documentation

## 2018-05-18 DIAGNOSIS — Z5321 Procedure and treatment not carried out due to patient leaving prior to being seen by health care provider: Secondary | ICD-10-CM | POA: Insufficient documentation

## 2018-05-18 DIAGNOSIS — Z3689 Encounter for other specified antenatal screening: Secondary | ICD-10-CM

## 2018-05-18 DIAGNOSIS — O26893 Other specified pregnancy related conditions, third trimester: Secondary | ICD-10-CM | POA: Insufficient documentation

## 2018-05-18 DIAGNOSIS — Z3A31 31 weeks gestation of pregnancy: Secondary | ICD-10-CM | POA: Diagnosis not present

## 2018-05-18 DIAGNOSIS — O0993 Supervision of high risk pregnancy, unspecified, third trimester: Secondary | ICD-10-CM | POA: Diagnosis not present

## 2018-05-18 DIAGNOSIS — O99280 Endocrine, nutritional and metabolic diseases complicating pregnancy, unspecified trimester: Secondary | ICD-10-CM

## 2018-05-18 DIAGNOSIS — O99323 Drug use complicating pregnancy, third trimester: Secondary | ICD-10-CM | POA: Diagnosis not present

## 2018-05-18 DIAGNOSIS — Z98891 History of uterine scar from previous surgery: Secondary | ICD-10-CM

## 2018-05-18 DIAGNOSIS — O10013 Pre-existing essential hypertension complicating pregnancy, third trimester: Secondary | ICD-10-CM | POA: Diagnosis not present

## 2018-05-18 DIAGNOSIS — F1910 Other psychoactive substance abuse, uncomplicated: Secondary | ICD-10-CM

## 2018-05-18 DIAGNOSIS — E039 Hypothyroidism, unspecified: Secondary | ICD-10-CM

## 2018-05-18 DIAGNOSIS — O99210 Obesity complicating pregnancy, unspecified trimester: Secondary | ICD-10-CM

## 2018-05-18 DIAGNOSIS — O10919 Unspecified pre-existing hypertension complicating pregnancy, unspecified trimester: Secondary | ICD-10-CM

## 2018-05-18 DIAGNOSIS — O0990 Supervision of high risk pregnancy, unspecified, unspecified trimester: Secondary | ICD-10-CM

## 2018-05-18 LAB — COMPREHENSIVE METABOLIC PANEL
ALT: 15 U/L (ref 0–44)
AST: 21 U/L (ref 15–41)
Albumin: 3.5 g/dL (ref 3.5–5.0)
Alkaline Phosphatase: 59 U/L (ref 38–126)
Anion gap: 9 (ref 5–15)
BUN: 7 mg/dL (ref 6–20)
CO2: 22 mmol/L (ref 22–32)
CREATININE: 0.62 mg/dL (ref 0.44–1.00)
Calcium: 9.5 mg/dL (ref 8.9–10.3)
Chloride: 105 mmol/L (ref 98–111)
Glucose, Bld: 93 mg/dL (ref 70–99)
POTASSIUM: 3.3 mmol/L — AB (ref 3.5–5.1)
Sodium: 136 mmol/L (ref 135–145)
TOTAL PROTEIN: 6.5 g/dL (ref 6.5–8.1)
Total Bilirubin: 1 mg/dL (ref 0.3–1.2)

## 2018-05-18 LAB — URINALYSIS, ROUTINE W REFLEX MICROSCOPIC
Bilirubin Urine: NEGATIVE
Glucose, UA: NEGATIVE mg/dL
Ketones, ur: NEGATIVE mg/dL
NITRITE: NEGATIVE
PH: 7 (ref 5.0–8.0)
Protein, ur: 30 mg/dL — AB
SPECIFIC GRAVITY, URINE: 1.017 (ref 1.005–1.030)

## 2018-05-18 LAB — CBC
HCT: 30.8 % — ABNORMAL LOW (ref 36.0–46.0)
Hemoglobin: 11 g/dL — ABNORMAL LOW (ref 12.0–15.0)
MCH: 32 pg (ref 26.0–34.0)
MCHC: 35.7 g/dL (ref 30.0–36.0)
MCV: 89.5 fL (ref 80.0–100.0)
PLATELETS: 212 10*3/uL (ref 150–400)
RBC: 3.44 MIL/uL — ABNORMAL LOW (ref 3.87–5.11)
RDW: 12.8 % (ref 11.5–15.5)
WBC: 10 10*3/uL (ref 4.0–10.5)
nRBC: 0 % (ref 0.0–0.2)

## 2018-05-18 LAB — RAPID URINE DRUG SCREEN, HOSP PERFORMED
Amphetamines: NOT DETECTED
Barbiturates: POSITIVE — AB
Benzodiazepines: NOT DETECTED
COCAINE: NOT DETECTED
OPIATES: NOT DETECTED
Tetrahydrocannabinol: POSITIVE — AB

## 2018-05-18 LAB — PROTEIN / CREATININE RATIO, URINE
Creatinine, Urine: 230 mg/dL
PROTEIN CREATININE RATIO: 0.11 mg/mg{creat} (ref 0.00–0.15)
Total Protein, Urine: 25 mg/dL

## 2018-05-18 MED ORDER — NIFEDIPINE 10 MG PO CAPS: 20.0000 mg | ORAL_CAPSULE | ORAL | Status: DC | PRN

## 2018-05-18 MED ORDER — NIFEDIPINE 10 MG PO CAPS
20.0000 mg | ORAL_CAPSULE | ORAL | Status: DC | PRN
  Filled 2018-05-18: qty 2

## 2018-05-18 MED ORDER — LABETALOL HCL 5 MG/ML IV SOLN: 40.0000 mg | INTRAVENOUS | Status: DC | PRN

## 2018-05-18 MED ORDER — NIFEDIPINE 10 MG PO CAPS
10.0000 mg | ORAL_CAPSULE | ORAL | Status: DC | PRN
  Administered 2018-05-18: 10 mg via ORAL
  Filled 2018-05-18: qty 1

## 2018-05-18 NOTE — ED Notes (Signed)
Pt states that she wants to go to [facility] hospital where her doctors know her. Informed patient that since she is here,go ahead and let our doctors see her and if we need to transport her to [facility] our [facility] team can do that. Pt adamant that she is leaving and going on to [facility] now, refusing to be seen by our EDPs here.

## 2018-05-18 NOTE — Discharge Instructions (Signed)

## 2018-05-18 NOTE — MAU Provider Note (Addendum)
History     CSN: [id number]  Arrival date and time: 05/18/18 2706   First Provider Initiated Contact with Patient 05/18/18 2024      Chief Complaint  Patient presents with  . Hypertension   HPI  [name] is a 32 y.o. G3P1011 at 87w5dwho presents to MAU for evaluation of severe range blood pressure on home cuff in addition to overall feeling of sleepyness. This is an existing problem. Patient's hypertension medication dosage was recently increased due to elevated BPs. Patient denies SOB, chest pain, weakness and syncope. Denies RUQ pain, visual disturbances, headache. Denies vaginal bleeding, leaking of fluid, decreased fetal movement, fever, falls, or recent illness.    [facility]. States she signed in once BP was confirmed and reported to MAU for treatment "because you know me here".  Patient endorses compliance with prescribed medications.  OB History    Gravida  3   Para  1   Term  1   Preterm      AB  1   Living  1     SAB  1   TAB      Ectopic      Multiple  0   Live Births  1          Patient Active Problem List   Diagnosis Date Noted  . Substance abuse affecting pregnancy in third trimester, antepartum 05/18/2018  . RUQ pain 05/07/2018  . Other headache syndrome 05/07/2018  . Obesity, Class III, BMI 40-49.9 (morbid obesity) ([place] 12/30/2017  . Obesity affecting pregnancy, antepartum 12/30/2017  . Hypothyroidism during pregnancy, antepartum 12/30/2017  . Supervision of high risk pregnancy, antepartum 12/16/2017  . Chronic hypertension in pregnancy 12/16/2017  . History of cesarean delivery 12/16/2017  . 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  . Hematuria 11/11/2017  . Hypertension   . 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 Thy[place] cancer ([place] 09/11/2016    Past Medical History:  Diagnosis Date  . Anxiety   . Cancer ([place] 02/2012   thyroid cancer  . Hypertension    . Thyroid disease    cancer    Past Surgical History:  Procedure Laterality Date  . CESAREAN SECTION  2006  . DILATION AND EVACUATION N/A 02/07/2015   Procedure: DILATATION AND EVACUATION;  Surgeon: CLavonia Drafts MD;  [facility];  Service: Gynecology;  Laterality: N/A;  . THYROIDECTOMY  02/2012   total L side first and R side 7 days later.    Family History  Problem Relation Age of Onset  . Hypertension Mother   . Blindness Mother        one eye    Social History   Tobacco Use  . Smoking status: Never Smoker  . Smokeless tobacco: Never Used  Substance Use Topics  . Alcohol use: No  . Drug use: No    Allergies:  Allergies  Allergen Reactions  . Hydrocodone Nausea And Vomiting    Medications Prior to Admission  Medication Sig Dispense Refill Last Dose  . aspirin EC 81 MG tablet Take 1 tablet (81 mg total) by mouth daily. 30 tablet 1 05/18/2018 at Unknown time  . Blood Pressure Monitoring (ADULT BLOOD PRESSURE CUFF LG) KIT 1 kit by Does not apply route every morning. 1 each 0 05/18/2018 at Unknown time  . levothyroxine (SYNTHROID, LEVOTHROID) 200 MCG tablet Take 1 tablet (200 mcg total) by mouth 2 (two) times daily. 90 tablet 1 05/18/2018 at Unknown  time  . NIFEdipine (ADALAT CC) 90 MG 24 hr tablet Take 1 tablet (90 mg total) by mouth daily. 30 tablet 1 05/18/2018 at Unknown time  . acetaminophen (TYLENOL) 325 MG tablet Take 650 mg by mouth every 6 (six) hours as needed for mild pain or headache.   Unknown at Unknown time  . docusate sodium (COLACE) 100 MG capsule Take 1 capsule (100 mg total) by mouth 2 (two) times daily as needed. 30 capsule 2 Unknown at Unknown time  . famotidine (PEPCID) 20 MG tablet Take 1 tablet (20 mg total) by mouth 2 (two) times daily. (Patient not taking: Reported on 05/17/2018) 60 tablet 0 Not Taking  . ondansetron (ZOFRAN) 4 MG tablet Take 1 tablet (4 mg total) by mouth every 8 (eight) hours as needed for nausea or vomiting. 20  tablet 0 Unknown at Unknown time  . Prenat w/o A Vit-FeFum-FePo-FA (CONCEPT OB) 130-92.4-1 MG CAPS Take 1 tablet by mouth daily. (Patient not taking: Reported on 05/17/2018) 30 capsule 12 Not Taking  . senna-docusate (SENOKOT-S) 8.6-50 MG tablet Take 1 tablet by mouth 2 (two) times daily. 60 tablet 1 Unknown at Unknown time  . sulfamethoxazole-trimethoprim (BACTRIM DS,SEPTRA DS) 800-160 MG tablet Take 1 tablet by mouth 2 (two) times daily. 10 tablet 0 Unknown at Unknown time    Review of Systems  Constitutional: Negative for chills and fever.  Gastrointestinal: Negative for abdominal pain.  Genitourinary: Negative for difficulty urinating, vaginal bleeding, vaginal discharge and vaginal pain.  Musculoskeletal: Negative for back pain.  Neurological: Negative for headaches.   Physical Exam   Blood pressure (!) 169/110, pulse 98, temperature 98.2 F (36.8 C), temperature source Oral, resp. rate 16, height '5\' 2"'  (1.575 m), weight 117 kg, last menstrual period 10/08/2017, SpO2 100 %, unknown if currently breastfeeding.  Physical Exam  Nursing note and vitals reviewed. Constitutional: She is oriented to person, place, and time. She appears well-developed and well-nourished.  Cardiovascular: Normal rate, normal heart sounds and intact distal pulses.  Respiratory: Effort normal and breath sounds normal. No respiratory distress. She has no wheezes. She has no rales. She exhibits no tenderness.  GI: She exhibits no distension. There is no tenderness. There is no rebound, no guarding and no CVA tenderness.  Gravid  Neurological: She is alert and oriented to person, place, and time. She has normal reflexes.  Skin: Skin is warm and dry.  Psychiatric: She has a normal mood and affect. Her behavior is normal. Judgment and thought content normal.    MAU Course/MDM   --Reactive fetal tracing: baseline 140, moderate variability, positive accelerations, no decelerations --[place]: quiet --Positive UDS,  patient states she quit all controlled substances 05/08/18 --Hematuria. Denies urinary symptoms, non-tender abdomen, no flank pain. Will wait for results of urine culture.  Patient Vitals for the past 24 hrs:  BP Temp Temp src Pulse Resp SpO2 Height Weight  05/18/18 2055 (!) 148/89 - - - - - - -  05/18/18 2030 (!) 169/110 - - 98 - - - -  05/18/18 2016 (!) 158/103 - - 89 - - - -  05/18/18 2001 (!) 163/99 - - 85 - - - -  05/18/18 1946 (!) 158/101 - - 87 - - - -  05/18/18 1938 (!) 148/104 - - 95 - - - -  05/18/18 1923 (!) 168/104 98.2 F (36.8 C) Oral 91 16 100 % '5\' 2"'  (1.575 m) 117 kg    Results for orders placed or performed during the hospital encounter of  05/18/18 (from the past 24 hour(s))  Rapid urine drug screen (hospital performed)     Status: Abnormal   Collection Time: 05/18/18  7:11 PM  Result Value Ref Range   Opiates NONE DETECTED NONE DETECTED   Cocaine NONE DETECTED NONE DETECTED   Benzodiazepines NONE DETECTED NONE DETECTED   Amphetamines NONE DETECTED NONE DETECTED   Tetrahydrocannabinol POSITIVE (A) NONE DETECTED   Barbiturates POSITIVE (A) NONE DETECTED  Protein / creatinine ratio, urine     Status: None   Collection Time: 05/18/18  7:11 PM  Result Value Ref Range   Creatinine, Urine 230.00 mg/dL   Total Protein, Urine 25 mg/dL   Protein Creatinine Ratio 0.11 0.00 - 0.15 mg/mg[Cre]  Urinalysis, Routine w reflex microscopic     Status: Abnormal   Collection Time: 05/18/18  7:11 PM  Result Value Ref Range   Color, Urine YELLOW YELLOW   APPearance HAZY (A) CLEAR   Specific Gravity, Urine 1.017 1.005 - 1.030   pH 7.0 5.0 - 8.0   Glucose, UA NEGATIVE NEGATIVE mg/dL   Hgb urine dipstick SMALL (A) NEGATIVE   Bilirubin Urine NEGATIVE NEGATIVE   Ketones, ur NEGATIVE NEGATIVE mg/dL   Protein, ur 30 (A) NEGATIVE mg/dL   Nitrite NEGATIVE NEGATIVE   Leukocytes, UA MODERATE (A) NEGATIVE   RBC / HPF 6-10 0 - 5 RBC/hpf   WBC, UA 0-5 0 - 5 WBC/hpf   Bacteria, UA RARE (A)  NONE SEEN   Squamous Epithelial / LPF 6-10 0 - 5   Mucus PRESENT   CBC     Status: Abnormal   Collection Time: 05/18/18  7:32 PM  Result Value Ref Range   WBC 10.0 4.0 - 10.5 K/uL   RBC 3.44 (L) 3.87 - 5.11 MIL/uL   Hemoglobin 11.0 (L) 12.0 - 15.0 g/dL   HCT 30.8 (L) 36.0 - 46.0 %   MCV 89.5 80.0 - 100.0 fL   MCH 32.0 26.0 - 34.0 pg   MCHC 35.7 30.0 - 36.0 g/dL   RDW 12.8 11.5 - 15.5 %   Platelets 212 150 - 400 K/uL   nRBC 0.0 0.0 - 0.2 %  Comprehensive metabolic panel     Status: Abnormal   Collection Time: 05/18/18  7:32 PM  Result Value Ref Range   Sodium 136 135 - 145 mmol/L   Potassium 3.3 (L) 3.5 - 5.1 mmol/L   Chloride 105 98 - 111 mmol/L   CO2 22 22 - 32 mmol/L   Glucose, Bld 93 70 - 99 mg/dL   BUN 7 6 - 20 mg/dL   Creatinine, Ser 0.62 0.44 - 1.00 mg/dL   Calcium 9.5 8.9 - 10.3 mg/dL   Total Protein 6.5 6.5 - 8.1 g/dL   Albumin 3.5 3.5 - 5.0 g/dL   AST 21 15 - 41 U/L   ALT 15 0 - 44 U/L   Alkaline Phosphatase 59 38 - 126 U/L   Total Bilirubin 1.0 0.3 - 1.2 mg/dL   GFR calc non Af Amer >60 >60 mL/min   GFR calc Af Amer >60 >60 mL/min   Anion gap 9 5 - 15    Meds ordered this encounter  Medications  . AND Linked Order Group   . NIFEdipine (PROCARDIA) capsule 10 mg   . NIFEdipine (PROCARDIA) capsule 20 mg   . NIFEdipine (PROCARDIA) capsule 20 mg   . labetalol (NORMODYNE,TRANDATE) injection 40 mg     Assessment and Plan  --32 y.o. G3P1011 at [redacted]w[redacted]d --Reactive  fetal tracing --Chronic HTN in pregnancy, negative PEC labs --Severe range pressures responsive to Procardia given in MAU --POSITIVE for THC and Barbiturates, discussed as possible cause of sleepy feeling at home, impact on fetal well-being  --Hematuria, urine culture pending. Will follow up PRN --Confirmed with patient that she should report to closest ED for elevated pressures and [facility] will consult with Faculty Attending PRN --Reviewed general obstetric precautions including but not limited to falls,  fever, vaginal bleeding, leaking of fluid, decreased fetal movement, headache not relieved by Tylenol, rest and PO hydration. --Discharge home in stable condition  Darlina Rumpf, [place] 05/18/2018, 9:05 PM

## 2018-05-18 NOTE — ED Notes (Signed)
RN aware of pts elevated BP 

## 2018-05-18 NOTE — ED Triage Notes (Signed)
Pt reports that she is over [redacted] weeks pregnant and in high risk clinic due to her HTN. Pt checked at home today and was 181/112 at home.

## 2018-05-18 NOTE — MAU Note (Addendum)
Pt took BP at home and it was 181/112.   Went to [facility] ED and it was 183/121.  Took Nifedipine at home.  Is here for BP check. Denies Headache, blurry vision or URQ pain. Denies LOF or bleeding. +FM

## 2018-05-21 LAB — CULTURE, OB URINE

## 2018-05-24 ENCOUNTER — Ambulatory Visit (HOSPITAL_COMMUNITY)
Admission: RE | Admit: 2018-05-24 | Discharge: 2018-05-24 | Disposition: A | Discharge status: Home / Self Care | Payer: Medicaid Other | Source: Ambulatory Visit | Attending: Family Medicine | Admitting: Family Medicine

## 2018-05-24 ENCOUNTER — Ambulatory Visit (HOSPITAL_COMMUNITY): Admission: RE | Admit: 2018-05-24 | Payer: Medicaid Other | Source: Ambulatory Visit

## 2018-05-24 ENCOUNTER — Ambulatory Visit (HOSPITAL_COMMUNITY): Payer: Medicaid Other

## 2018-05-24 ENCOUNTER — Other Ambulatory Visit (HOSPITAL_COMMUNITY): Payer: Self-pay | Admitting: Maternal and Fetal Medicine

## 2018-05-24 DIAGNOSIS — Z98891 History of uterine scar from previous surgery: Secondary | ICD-10-CM

## 2018-05-24 DIAGNOSIS — O99213 Obesity complicating pregnancy, third trimester: Secondary | ICD-10-CM

## 2018-05-24 DIAGNOSIS — O10919 Unspecified pre-existing hypertension complicating pregnancy, unspecified trimester: Secondary | ICD-10-CM

## 2018-05-24 DIAGNOSIS — O10913 Unspecified pre-existing hypertension complicating pregnancy, third trimester: Secondary | ICD-10-CM | POA: Insufficient documentation

## 2018-05-24 DIAGNOSIS — Z3A32 32 weeks gestation of pregnancy: Secondary | ICD-10-CM | POA: Insufficient documentation

## 2018-05-24 DIAGNOSIS — O34219 Maternal care for unspecified type scar from previous cesarean delivery: Secondary | ICD-10-CM

## 2018-05-24 DIAGNOSIS — O10013 Pre-existing essential hypertension complicating pregnancy, third trimester: Secondary | ICD-10-CM

## 2018-05-24 DIAGNOSIS — E039 Hypothyroidism, unspecified: Secondary | ICD-10-CM

## 2018-05-24 DIAGNOSIS — O99283 Endocrine, nutritional and metabolic diseases complicating pregnancy, third trimester: Secondary | ICD-10-CM

## 2018-05-24 DIAGNOSIS — O99210 Obesity complicating pregnancy, unspecified trimester: Secondary | ICD-10-CM

## 2018-05-24 DIAGNOSIS — O99280 Endocrine, nutritional and metabolic diseases complicating pregnancy, unspecified trimester: Secondary | ICD-10-CM

## 2018-05-24 DIAGNOSIS — O0990 Supervision of high risk pregnancy, unspecified, unspecified trimester: Secondary | ICD-10-CM

## 2018-05-26 ENCOUNTER — Ambulatory Visit (INDEPENDENT_AMBULATORY_CARE_PROVIDER_SITE_OTHER): Payer: Medicaid Other | Admitting: *Deleted

## 2018-05-26 ENCOUNTER — Encounter (HOSPITAL_COMMUNITY): Payer: Self-pay

## 2018-05-26 ENCOUNTER — Telehealth: Payer: Self-pay | Admitting: Family Medicine

## 2018-05-26 ENCOUNTER — Ambulatory Visit (INDEPENDENT_AMBULATORY_CARE_PROVIDER_SITE_OTHER): Payer: Medicaid Other | Admitting: Family Medicine

## 2018-05-26 VITALS — BP 133/79 | HR 105 | Wt 259.4 lb

## 2018-05-26 DIAGNOSIS — O99280 Endocrine, nutritional and metabolic diseases complicating pregnancy, unspecified trimester: Secondary | ICD-10-CM

## 2018-05-26 DIAGNOSIS — O10919 Unspecified pre-existing hypertension complicating pregnancy, unspecified trimester: Secondary | ICD-10-CM

## 2018-05-26 DIAGNOSIS — Z98891 History of uterine scar from previous surgery: Secondary | ICD-10-CM

## 2018-05-26 DIAGNOSIS — R3129 Other microscopic hematuria: Secondary | ICD-10-CM

## 2018-05-26 DIAGNOSIS — E039 Hypothyroidism, unspecified: Secondary | ICD-10-CM

## 2018-05-26 DIAGNOSIS — O0990 Supervision of high risk pregnancy, unspecified, unspecified trimester: Secondary | ICD-10-CM

## 2018-05-26 DIAGNOSIS — E890 Postprocedural hypothyroidism: Secondary | ICD-10-CM

## 2018-05-26 LAB — POCT URINALYSIS DIP (DEVICE)
GLUCOSE, UA: NEGATIVE mg/dL
Nitrite: NEGATIVE
PROTEIN: 100 mg/dL — AB
SPECIFIC GRAVITY, URINE: 1.025 (ref 1.005–1.030)
Urobilinogen, UA: 0.2 mg/dL (ref 0.0–1.0)
pH: 6 (ref 5.0–8.0)

## 2018-05-26 MED ORDER — LEVOTHYROXINE SODIUM 200 MCG PO TABS: 200.0000 ug | ORAL_TABLET | Freq: Every day | ORAL | 1 refills | Status: DC

## 2018-05-26 NOTE — Progress Notes (Signed)
BPP done @ MF on 10/21. Next [place] for growth/BPP @ [facility] on 11/11.  Pt states she was not aware that Levothyroxine had been prescribed as 200 mcg twice daily last week. She is only taking once and says that is her correct dose.

## 2018-05-26 NOTE — Progress Notes (Signed)
PRENATAL VISIT NOTE  Subjective:  [name] is a 32 y.o. G3P1011 at [redacted]w[redacted]d being seen today for ongoing prenatal care.  She is currently monitored for the following issues for this high-risk pregnancy and has Hypertension; Postoperative hypothyroidism; Thyroid cancer ([place]); Hematuria; Supervision of high risk pregnancy, antepartum; Chronic hypertension in pregnancy; History of cesarean delivery; Status post complete thyroidectomy; Obesity, Class III, BMI 40-49.9 (morbid obesity) ([place]); Obesity affecting pregnancy, antepartum; Hypothyroidism during pregnancy, antepartum; RUQ pain; Other headache syndrome; and Substance abuse affecting pregnancy in third trimester, antepartum on their problem list.  Patient reports painful voiding.  Contractions: Not present. Vag. Bleeding: None.  Movement: Present. Denies leaking of fluid.   The following portions of the patient's history were reviewed and updated as appropriate: allergies, current medications, past family history, past medical history, past social history, past surgical history and problem list. Problem list updated.  Objective:   Vitals:   05/26/18 1000  BP: 133/79  Pulse: (!) 105  Weight: 259 lb 6.4 oz (117.7 kg)    Fetal Status: Fetal Heart Rate (bpm): NST   Movement: Present     General:  Alert, oriented and cooperative. Patient is in no acute distress.  Skin: Skin is warm and dry. No rash noted.   Cardiovascular: Normal heart rate noted  Respiratory: Normal respiratory effort, no problems with respiration noted  Abdomen: Soft, gravid, appropriate for gestational age.  Pain/Pressure: Present     Pelvic: Cervical exam deferred        Extremities: Normal range of motion.  Edema: None  Mental Status: Normal mood and affect. Normal behavior. Normal judgment and thought content.   Urinalysis    Component Value Date/Time   COLORURINE YELLOW 05/18/2018 1911   APPEARANCEUR HAZY (A) 05/18/2018 1911   LABSPEC 1.025 05/26/2018  1021   PHURINE 6.0 05/26/2018 1021   GLUCOSEU NEGATIVE 05/26/2018 1021   HGBUR MODERATE (A) 05/26/2018 1021   BILIRUBINUR SMALL (A) 05/26/2018 1021   KETONESUR TRACE (A) 05/26/2018 1021   PROTEINUR 100 (A) 05/26/2018 1021   UROBILINOGEN 0.2 05/26/2018 1021   NITRITE NEGATIVE 05/26/2018 1021   LEUKOCYTESUR LARGE (A) 05/26/2018 1021    NST:  Baseline: 145 bpm, Variability: Good {> 6 bpm), Accelerations: Reactive and Decelerations: Absent   Assessment and Plan:  Pregnancy: G3P1011 at [redacted]w[redacted]d  1. Supervision of high risk pregnancy, antepartum   2. Chronic hypertension in pregnancy Increased procardia, and BP improved dramatically [place] for growth on 11/11 Continue ASA, Procardia Continue weekly BPP  3. Hypothyroidism during pregnancy, antepartum Continue synthroid--repeat TSH in 3 wks - levothyroxine (SYNTHROID, LEVOTHROID) 200 MCG tablet; Take 1 tablet (200 mcg total) by mouth daily before breakfast.  Dispense: 90 tablet; Refill: 1  4. History of cesarean delivery Desires RCS--no BTL--booked at 39 wks  5. Postoperative hypothyroidism   6. Other microscopic hematuria Check culture and treat accordingly - Culture, OB Urine  Preterm labor symptoms and general obstetric precautions including but not limited to vaginal bleeding, contractions, leaking of fluid and fetal movement were reviewed in detail with the patient. Please refer to After Visit Summary for other counseling recommendations.  Return in about 1 week (around 06/02/2018) for NST/BPP and HOB weekly, Urine each visit.  Future Appointments  Date Time Provider [place]  05/26/2018 11:15 AM Day, Ronnell Freshwater, RN [place]  06/09/2018  9:15 AM WOC-WOCA NST WOC[facility]A WOC  06/09/2018 10:15 AM Donnamae Jude, MD [facility]OC-W[facility]A WOC  06/14/2018  8:00 AM WH-MFC [place] 3 WH-MFCUS MFC-US  Donnamae Jude, MD

## 2018-05-26 NOTE — Telephone Encounter (Signed)
Called and left pt a VM with her next NST/BPP appt on 10/30 @ 0915. Advised to call office if she needs to reschedule. Will also mail an appt reminder letter.

## 2018-05-26 NOTE — Patient Instructions (Signed)

## 2018-05-31 ENCOUNTER — Ambulatory Visit (HOSPITAL_COMMUNITY): Payer: Medicaid Other

## 2018-05-31 ENCOUNTER — Telehealth (HOSPITAL_COMMUNITY): Payer: Self-pay | Admitting: *Deleted

## 2018-06-02 ENCOUNTER — Encounter: Payer: Self-pay | Admitting: *Deleted

## 2018-06-02 ENCOUNTER — Other Ambulatory Visit: Payer: Medicaid Other

## 2018-06-02 LAB — URINE CULTURE, OB REFLEX

## 2018-06-02 LAB — CULTURE, OB URINE

## 2018-06-04 ENCOUNTER — Encounter (HOSPITAL_COMMUNITY): Payer: Self-pay | Admitting: Nurse Practitioner

## 2018-06-04 ENCOUNTER — Emergency Department (HOSPITAL_COMMUNITY)
Admission: EM | Admit: 2018-06-04 | Discharge: 2018-06-04 | Disposition: A | Discharge status: Home / Self Care | Payer: Medicaid Other | Attending: Emergency Medicine | Admitting: Emergency Medicine

## 2018-06-04 ENCOUNTER — Other Ambulatory Visit: Payer: Self-pay | Admitting: Student

## 2018-06-04 DIAGNOSIS — H538 Other visual disturbances: Secondary | ICD-10-CM | POA: Insufficient documentation

## 2018-06-04 DIAGNOSIS — R112 Nausea with vomiting, unspecified: Secondary | ICD-10-CM | POA: Insufficient documentation

## 2018-06-04 DIAGNOSIS — Z3A36 36 weeks gestation of pregnancy: Secondary | ICD-10-CM | POA: Diagnosis not present

## 2018-06-04 DIAGNOSIS — R51 Headache: Secondary | ICD-10-CM | POA: Diagnosis not present

## 2018-06-04 DIAGNOSIS — R519 Headache, unspecified: Secondary | ICD-10-CM

## 2018-06-04 DIAGNOSIS — E039 Hypothyroidism, unspecified: Secondary | ICD-10-CM | POA: Insufficient documentation

## 2018-06-04 DIAGNOSIS — O133 Gestational [pregnancy-induced] hypertension without significant proteinuria, third trimester: Secondary | ICD-10-CM | POA: Diagnosis not present

## 2018-06-04 DIAGNOSIS — Z79899 Other long term (current) drug therapy: Secondary | ICD-10-CM | POA: Insufficient documentation

## 2018-06-04 DIAGNOSIS — Z85850 Personal history of malignant neoplasm of thyroid: Secondary | ICD-10-CM | POA: Insufficient documentation

## 2018-06-04 DIAGNOSIS — O26893 Other specified pregnancy related conditions, third trimester: Secondary | ICD-10-CM | POA: Diagnosis present

## 2018-06-04 LAB — COMPREHENSIVE METABOLIC PANEL
ALBUMIN: 3.5 g/dL (ref 3.5–5.0)
ALK PHOS: 68 U/L (ref 38–126)
ALT: 12 U/L (ref 0–44)
AST: 18 U/L (ref 15–41)
Anion gap: 9 (ref 5–15)
BILIRUBIN TOTAL: 0.7 mg/dL (ref 0.3–1.2)
BUN: 7 mg/dL (ref 6–20)
CALCIUM: 9.3 mg/dL (ref 8.9–10.3)
CO2: 23 mmol/L (ref 22–32)
CREATININE: 0.63 mg/dL (ref 0.44–1.00)
Chloride: 109 mmol/L (ref 98–111)
GFR calc Af Amer: >60 mL/min (ref >60)
GLUCOSE: 91 mg/dL (ref 70–99)
Potassium: 3.3 mmol/L — ABNORMAL LOW (ref 3.5–5.1)
Sodium: 141 mmol/L (ref 135–145)
TOTAL PROTEIN: 6.9 g/dL (ref 6.5–8.1)

## 2018-06-04 LAB — URINALYSIS, ROUTINE W REFLEX MICROSCOPIC
BILIRUBIN URINE: NEGATIVE
Glucose, UA: NEGATIVE mg/dL
KETONES UR: NEGATIVE mg/dL
Nitrite: NEGATIVE
PROTEIN: NEGATIVE mg/dL
SPECIFIC GRAVITY, URINE: 1.016 (ref 1.005–1.030)
pH: 6 (ref 5.0–8.0)

## 2018-06-04 LAB — RAPID URINE DRUG SCREEN, HOSP PERFORMED
Amphetamines: NOT DETECTED
Barbiturates: NOT DETECTED
Benzodiazepines: NOT DETECTED
COCAINE: NOT DETECTED
OPIATES: NOT DETECTED
TETRAHYDROCANNABINOL: POSITIVE — AB

## 2018-06-04 LAB — CBC WITH DIFFERENTIAL/PLATELET
Abs Immature Granulocytes: 0.14 10*3/uL — ABNORMAL HIGH (ref 0.00–0.07)
BASOS PCT: 0 %
Basophils Absolute: 0 10*3/uL (ref 0.0–0.1)
EOS ABS: 0 10*3/uL (ref 0.0–0.5)
EOS PCT: 0 %
HEMATOCRIT: 32.7 % — AB (ref 36.0–46.0)
Hemoglobin: 11.5 g/dL — ABNORMAL LOW (ref 12.0–15.0)
Immature Granulocytes: 1 %
LYMPHS ABS: 2.1 10*3/uL (ref 0.7–4.0)
Lymphocytes Relative: 21 %
MCH: 31.9 pg (ref 26.0–34.0)
MCHC: 35.2 g/dL (ref 30.0–36.0)
MCV: 90.8 fL (ref 80.0–100.0)
MONO ABS: 0.8 10*3/uL (ref 0.1–1.0)
MONOS PCT: 8 %
NRBC: 0 % (ref 0.0–0.2)
Neutro Abs: 6.9 10*3/uL (ref 1.7–7.7)
Neutrophils Relative %: 70 %
PLATELETS: 237 10*3/uL (ref 150–400)
RBC: 3.6 MIL/uL — ABNORMAL LOW (ref 3.87–5.11)
RDW: 12.8 % (ref 11.5–15.5)
WBC: 10 10*3/uL (ref 4.0–10.5)

## 2018-06-04 LAB — URIC ACID: URIC ACID, SERUM: 4.4 mg/dL (ref 2.5–7.1)

## 2018-06-04 LAB — PROTEIN / CREATININE RATIO, URINE
Creatinine, Urine: 164 mg/dL
Protein Creatinine Ratio: 0.18 mg/mg{Cre} — ABNORMAL HIGH (ref 0.00–0.15)
Total Protein, Urine: 30 mg/dL

## 2018-06-04 MED ORDER — HYDRALAZINE HCL 20 MG/ML IJ SOLN
5.0000 mg | INTRAMUSCULAR | Status: DC | PRN
  Administered 2018-06-04: 5 mg via INTRAVENOUS
  Filled 2018-06-04: qty 1

## 2018-06-04 MED ORDER — NIFEDIPINE ER OSMOTIC RELEASE 60 MG PO TB24: 60.0000 mg | ORAL_TABLET | Freq: Two times a day (BID) | ORAL | 0 refills | Status: DC

## 2018-06-04 MED ORDER — FAMOTIDINE 20 MG PO TABS: 20.0000 mg | ORAL_TABLET | Freq: Two times a day (BID) | ORAL | 3 refills | Status: DC

## 2018-06-04 MED ORDER — LACTATED RINGERS IV SOLN
INTRAVENOUS | Status: DC
  Administered 2018-06-04: 125 mL/h via INTRAVENOUS

## 2018-06-04 MED ORDER — HYDRALAZINE HCL 20 MG/ML IJ SOLN
10.0000 mg | INTRAMUSCULAR | Status: DC | PRN
  Administered 2018-06-04: 10 mg via INTRAVENOUS
  Filled 2018-06-04: qty 1

## 2018-06-04 MED ORDER — LABETALOL HCL 5 MG/ML IV SOLN
40.0000 mg | INTRAVENOUS | Status: DC | PRN
  Filled 2018-06-04: qty 8

## 2018-06-04 MED ORDER — LABETALOL HCL 5 MG/ML IV SOLN
20.0000 mg | INTRAVENOUS | Status: DC | PRN
  Filled 2018-06-04: qty 4

## 2018-06-04 MED ORDER — ACETAMINOPHEN 500 MG PO TABS
1000.0000 mg | ORAL_TABLET | Freq: Four times a day (QID) | ORAL | Status: DC | PRN
  Administered 2018-06-04: 1000 mg via ORAL
  Filled 2018-06-04: qty 2

## 2018-06-04 NOTE — ED Triage Notes (Signed)
Pt is c/o severe headache and elevated BP. She states she is 8 months pregnant.

## 2018-06-04 NOTE — Discharge Instructions (Signed)
Please read and follow all provided instructions.  Your diagnoses today include:  1. Acute nonintractable headache, unspecified headache type   2. Gestational hypertension without significant proteinuria in third trimester     Tests performed today include:  Vital signs. See below for your results today.   Additional information:  Follow any educational materials contained in this packet.  Please begin taking 60 mg of nifedipine twice a day instead of 90 mg once a day.   Follow-up instructions: Follow-up at the [facility] clinic on Monday for blood pressure recheck.  Call Monday morning to find out when you should go in.  Return instructions:   Please return to the Emergency Department if you experience worsening symptoms.  Return if the medications do not resolve your headache, if it recurs, or if you have multiple episodes of vomiting or cannot keep down fluids.  Return if you have a change from the usual headache.  RETURN IMMEDIATELY IF you:  Develop a sudden, severe headache  Develop confusion or become poorly responsive or faint  Develop a fever above 100.28F or problem breathing  Have a change in speech, vision, swallowing, or understanding  Develop new weakness, numbness, tingling, incoordination in your arms or legs  Have a seizure  Please return if you have any other emergent concerns.  Additional Information:  Your vital signs today were: BP (!) 151/93    Pulse 94    Temp 98.2 F (36.8 C) (Oral)    Resp (!) 26    LMP 10/08/2017    SpO2 100%  If your blood pressure (BP) was elevated above 135/85 this visit, please have this repeated by your doctor within one month. --------------

## 2018-06-04 NOTE — ED Notes (Signed)
This note also relates to the following rows which could not be included: SpO2 - Cannot attach notes to unvalidated device data  [facility] SPOKE WITH ED PROVIDER ABOUT OB ASSESSMENT; MEDS; V/S; FHR; PROVIDER CALLED OB ATTENDING TO DISCUSS PLAN OF CARE OB ATTENDING, DR [id number] CALLED [facility] AND DISCUSSED PLAN OF CARE; SHARED D/C INSTRUCTIONS WITH [facility]; [facility] UPDATED OB ON PT SYMPTOMS/PHYSICAL ASSESSMENT, V/S, MEDS, FHR....; PT F/U OFFICE MONDAY

## 2018-06-04 NOTE — ED Provider Notes (Signed)
[facility] DEPT Provider Note   CSN: [id number] Arrival date & time: 06/04/18  1908     History   Chief Complaint Chief Complaint  Patient presents with  . Headache    HPI [name] is a 32 y.o. female.  Patient with history of hypertension on 90 mg nifedipine once daily, substance abuse, hypothyroidism --presents with elevated blood pressures and headache today.  Patient has been treated for high blood pressure in pregnancy at the high risk clinic at [facility] hospital.  She has been taking her medications.  Today she developed a left-sided headache with double vision.  She had one episode of vomiting.  No weakness, numbness, or tingling in the arms of the legs.  No seizure activity.  She presents the emergency department for further evaluation and treatment.  States that smells make her headache worse.  She is not particularly sensitive to light or sound.  No treatments prior to arrival.     Past Medical History:  Diagnosis Date  . Anxiety   . Cancer ([place]) 02/2012   thyroid cancer  . Hypertension   . Thyroid disease    cancer    Patient Active Problem List   Diagnosis Date Noted  . Substance abuse affecting pregnancy in third trimester, antepartum 05/18/2018  . RUQ pain 05/07/2018  . Other headache syndrome 05/07/2018  . Obesity, Class III, BMI 40-49.9 (morbid obesity) ([place]) 12/30/2017  . Obesity affecting pregnancy, antepartum 12/30/2017  . Hypothyroidism during pregnancy, antepartum 12/30/2017  . Supervision of high risk pregnancy, antepartum 12/16/2017  . Chronic hypertension in pregnancy 12/16/2017  . History of cesarean delivery 12/16/2017  . 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  . Hematuria 11/11/2017  . Hypertension   . 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 Thyroid cancer ([place]) 09/11/2016    Past Surgical History:  Procedure Laterality Date  . CESAREAN SECTION  2006  . DILATION AND EVACUATION N/A  02/07/2015   Procedure: DILATATION AND EVACUATION;  Surgeon: Lavonia Drafts, MD;  Location: [facility];  Service: Gynecology;  Laterality: N/A;  . THYROIDECTOMY  02/2012   total L side first and R side 7 days later.     OB History    Gravida  3   Para  1   Term  1   Preterm      AB  1   Living  1     SAB  1   TAB      Ectopic      Multiple  0   Live Births  1            Home Medications    Prior to Admission medications   Medication Sig Start Date End Date Taking? Authorizing Provider  acetaminophen (TYLENOL) 325 MG tablet Take 650 mg by mouth every 6 (six) hours as needed for mild pain or headache.   Yes [provider]  aspirin EC 81 MG tablet Take 1 tablet (81 mg total) by mouth daily. 04/23/18  Yes Nicolette Bang, DO  docusate sodium (COLACE) 100 MG capsule Take 1 capsule (100 mg total) by mouth 2 (two) times daily as needed. Patient taking differently: Take 100 mg by mouth 2 (two) times daily as needed for mild constipation.  05/17/18  Yes Donnamae Jude, MD  levothyroxine (SYNTHROID, LEVOTHROID) 200 MCG tablet Take 1 tablet (200 mcg total) by mouth daily before breakfast. 05/26/18  Yes Donnamae Jude, MD  Blood Pressure Monitoring (ADULT BLOOD PRESSURE CUFF LG) KIT  1 kit by Does not apply route every morning. 05/13/18   Aura Camps, MD  famotidine (PEPCID) 20 MG tablet Take 1 tablet (20 mg total) by mouth 2 (two) times daily. 06/04/18   Anyanwu, Sallyanne Havers, MD  NIFEdipine (PROCARDIA XL/NIFEDICAL XL) 60 MG 24 hr tablet Take 1 tablet (60 mg total) by mouth 2 (two) times daily. 06/04/18   Carlisle Cater, PA-C    Family History Family History  Problem Relation Age of Onset  . Hypertension Mother   . Blindness Mother        one eye    Social History Social History   Tobacco Use  . Smoking status: Never Smoker  . Smokeless tobacco: Never Used  Substance Use Topics  . Alcohol use: No  . Drug use: No     Allergies    Hydrocodone   Review of Systems Review of Systems  Constitutional: Negative for fever.  HENT: Negative for congestion, dental problem, rhinorrhea and sinus pressure.   Eyes: Positive for visual disturbance. Negative for photophobia, discharge and redness.  Respiratory: Negative for shortness of breath.   Cardiovascular: Negative for chest pain.  Gastrointestinal: Positive for nausea and vomiting.  Musculoskeletal: Negative for gait problem, neck pain and neck stiffness.  Skin: Negative for rash.  Neurological: Positive for headaches. Negative for syncope, speech difficulty, weakness, light-headedness and numbness.  Psychiatric/Behavioral: Negative for confusion.     Physical Exam Updated Vital Signs BP (!) 151/93   Pulse 94   Temp 98.4 F (36.9 C) (Oral)   Resp (!) 26   LMP 10/08/2017   SpO2 100%   Physical Exam  Constitutional: She is oriented to person, place, and time. She appears well-developed and well-nourished.  HENT:  Head: Normocephalic and atraumatic.  Right Ear: Tympanic membrane, external ear and ear canal normal.  Left Ear: Tympanic membrane, external ear and ear canal normal.  Nose: Nose normal.  Mouth/Throat: Uvula is midline, oropharynx is clear and moist and mucous membranes are normal.  Eyes: Pupils are equal, round, and reactive to light. Conjunctivae, EOM and lids are normal. Right eye exhibits no nystagmus. Left eye exhibits no nystagmus.  Neck: Normal range of motion. Neck supple.  Cardiovascular: Regular rhythm.  Mild tachycardia  Pulmonary/Chest: Effort normal and breath sounds normal.  Abdominal: Soft. There is no tenderness.  Musculoskeletal:       Cervical back: She exhibits normal range of motion, no tenderness and no bony tenderness. Trace pitting edema ankles, bilateral lower extremities.  Neurological: She is alert and oriented to person, place, and time. She has normal strength and normal reflexes. No cranial nerve deficit or sensory  deficit. She displays a negative Romberg sign. Coordination and gait normal. GCS eye subscore is 4. GCS verbal subscore is 5. GCS motor subscore is 6. Normal cover/uncover.  Skin: Skin is warm and dry.  Psychiatric: She has a normal mood and affect.  Nursing note and vitals reviewed.    ED Treatments / Results  Labs (all labs ordered are listed, but only abnormal results are displayed) Labs Reviewed  CBC WITH DIFFERENTIAL/PLATELET - Abnormal; Notable for the following components:      Result Value   RBC 3.60 (*)    Hemoglobin 11.5 (*)    HCT 32.7 (*)    Abs Immature Granulocytes 0.14 (*)    All other components within normal limits  URINALYSIS, ROUTINE W REFLEX MICROSCOPIC - Abnormal; Notable for the following components:   APPearance HAZY (*)    Hgb urine  dipstick MODERATE (*)    Leukocytes, UA TRACE (*)    Bacteria, UA RARE (*)    All other components within normal limits  RAPID URINE DRUG SCREEN, HOSP PERFORMED - Abnormal; Notable for the following components:   Tetrahydrocannabinol POSITIVE (*)    All other components within normal limits  COMPREHENSIVE METABOLIC PANEL - Abnormal; Notable for the following components:   Potassium 3.3 (*)    All other components within normal limits  PROTEIN / CREATININE RATIO, URINE - Abnormal; Notable for the following components:   Protein Creatinine Ratio 0.18 (*)    All other components within normal limits  URIC ACID    EKG None  Radiology No results found.  Procedures Procedures (including critical care time)  Medications Ordered in ED Medications  hydrALAZINE (APRESOLINE) injection 5 mg (5 mg Intravenous Given 06/04/18 2118)    And  hydrALAZINE (APRESOLINE) injection 10 mg (10 mg Intravenous Given 06/04/18 2132)    And  labetalol (NORMODYNE,TRANDATE) injection 20 mg (has no administration in time range)    And  labetalol (NORMODYNE,TRANDATE) injection 40 mg (has no administration in time range)  acetaminophen (TYLENOL)  tablet 1,000 mg (1,000 mg Oral Given 06/04/18 2140)  lactated ringers infusion (125 mL/hr Intravenous New Bag/Given 06/04/18 2115)     Initial Impression / Assessment and Plan / ED Course  I have reviewed the triage vital signs and the nursing notes.  Pertinent labs & imaging results that were available during my care of the patient were reviewed by me and considered in my medical decision making (see chart for details).     Patient seen and examined. Work-up initiated. Discussed with Dr. Melina Copa.  I asked nurse that rapid OB be called for further evaluation.  Vital signs reviewed and are as follows: BP (!) 151/93   Pulse 94   Temp 98.4 F (36.9 C) (Oral)   Resp (!) 26   LMP 10/08/2017   SpO2 100%   Rapid OB contacted.   Rapid [facility] at bedside.  Patient with improvement in blood pressure of 2 doses of hydralazine.  She began feeling heaviness in her chest and palpitations after the second dose. Will discuss case with Dr. Harolyn Rutherford.   BP (!) 151/93   Pulse 94   Temp 98.4 F (36.9 C) (Oral)   Resp (!) 26   LMP 10/08/2017   SpO2 100%   11:28 PM spoke with Dr. Harolyn Rutherford.  She has reviewed patient's labs.  We discussed her exam.  She does not feel that patient has preeclampsia tonight superimposed on her chronic hypertension.  Current plan is to increase dose of nifedipine to 60 mg twice daily for a total of 120 mg daily.  Patient will follow-up on Monday for blood pressure recheck in clinic.  Also recommends instructing patient to go to MAU if she develops any worsening symptoms over the weekend.  Otherwise, no indications for admission at this time.  Bedside monitoring of the child is complete.  There are no concerning features.  Patient appears comfortable and is feeling better after Tylenol and fluids.  Exam is stable.  Discussed plan with patient and family at bedside.  Patient states that she has some old leftover 60 mg tablets which she can use.  She will also be provided with a  prescription.  She understands that she should follow-up on Monday for blood pressure recheck.  We discussed signs and symptoms which should cause her to return to the MAU or to the closest ED.  Final Clinical Impressions(s) / ED Diagnoses   Final diagnoses:  Acute nonintractable headache, unspecified headache type  Gestational hypertension without significant proteinuria in third trimester   Patient with left-sided headache in setting of pregnancy.  Patient also has chronic hypertension which is being monitored in the high-risk clinic.  She is on nifedipine for this.  OB/GYN contacted night and patient assessed in the emergency department by myself and by OB/GYN.  Preeclampsia work-up undertaken without any concerning findings.  Patient without any neurological deficits.  Headache is not severe, no thunderclap.  She is not confused.  Do not suspect intracranial process given her symptoms.  Doubt venous sinus thrombosis. She appears comfortable.  Plan for medication adjustment as above and follow-up as above.  ED Discharge Orders         Ordered    famotidine (PEPCID) 20 MG tablet  2 times daily     06/04/18 2251    NIFEdipine (PROCARDIA XL/NIFEDICAL XL) 60 MG 24 hr tablet  2 times daily     06/04/18 2312           Carlisle Cater, PA-C 06/04/18 2333    Hayden Rasmussen, MD 06/05/18 1316

## 2018-06-07 ENCOUNTER — Ambulatory Visit (HOSPITAL_COMMUNITY): Payer: Medicaid Other

## 2018-06-07 ENCOUNTER — Inpatient Hospital Stay (HOSPITAL_BASED_OUTPATIENT_CLINIC_OR_DEPARTMENT_OTHER): Payer: Medicaid Other

## 2018-06-07 ENCOUNTER — Ambulatory Visit (INDEPENDENT_AMBULATORY_CARE_PROVIDER_SITE_OTHER): Payer: Medicaid Other | Admitting: *Deleted

## 2018-06-07 ENCOUNTER — Observation Stay (HOSPITAL_COMMUNITY)
Admission: AD | Admit: 2018-06-07 | Discharge: 2018-06-08 | Disposition: A | Discharge status: Home / Self Care | Payer: Medicaid Other | Source: Ambulatory Visit | Attending: Obstetrics and Gynecology | Admitting: Obstetrics and Gynecology

## 2018-06-07 ENCOUNTER — Other Ambulatory Visit: Payer: Self-pay

## 2018-06-07 ENCOUNTER — Encounter (HOSPITAL_COMMUNITY): Payer: Self-pay | Admitting: *Deleted

## 2018-06-07 VITALS — BP 186/108 | Wt 258.6 lb

## 2018-06-07 DIAGNOSIS — O99283 Endocrine, nutritional and metabolic diseases complicating pregnancy, third trimester: Secondary | ICD-10-CM

## 2018-06-07 DIAGNOSIS — E890 Postprocedural hypothyroidism: Secondary | ICD-10-CM | POA: Diagnosis not present

## 2018-06-07 DIAGNOSIS — Z3A34 34 weeks gestation of pregnancy: Secondary | ICD-10-CM | POA: Diagnosis not present

## 2018-06-07 DIAGNOSIS — F1910 Other psychoactive substance abuse, uncomplicated: Secondary | ICD-10-CM | POA: Diagnosis present

## 2018-06-07 DIAGNOSIS — O10913 Unspecified pre-existing hypertension complicating pregnancy, third trimester: Secondary | ICD-10-CM | POA: Diagnosis present

## 2018-06-07 DIAGNOSIS — O34219 Maternal care for unspecified type scar from previous cesarean delivery: Secondary | ICD-10-CM | POA: Diagnosis not present

## 2018-06-07 DIAGNOSIS — Z98891 History of uterine scar from previous surgery: Secondary | ICD-10-CM

## 2018-06-07 DIAGNOSIS — O10919 Unspecified pre-existing hypertension complicating pregnancy, unspecified trimester: Secondary | ICD-10-CM

## 2018-06-07 DIAGNOSIS — Z7982 Long term (current) use of aspirin: Secondary | ICD-10-CM | POA: Diagnosis not present

## 2018-06-07 DIAGNOSIS — O0990 Supervision of high risk pregnancy, unspecified, unspecified trimester: Secondary | ICD-10-CM

## 2018-06-07 DIAGNOSIS — Z79890 Hormone replacement therapy (postmenopausal): Secondary | ICD-10-CM | POA: Insufficient documentation

## 2018-06-07 DIAGNOSIS — Z85850 Personal history of malignant neoplasm of thyroid: Secondary | ICD-10-CM | POA: Insufficient documentation

## 2018-06-07 DIAGNOSIS — E66813 Obesity, class 3: Secondary | ICD-10-CM | POA: Diagnosis present

## 2018-06-07 DIAGNOSIS — O10013 Pre-existing essential hypertension complicating pregnancy, third trimester: Secondary | ICD-10-CM

## 2018-06-07 DIAGNOSIS — O99213 Obesity complicating pregnancy, third trimester: Secondary | ICD-10-CM | POA: Diagnosis not present

## 2018-06-07 DIAGNOSIS — O99280 Endocrine, nutritional and metabolic diseases complicating pregnancy, unspecified trimester: Secondary | ICD-10-CM

## 2018-06-07 DIAGNOSIS — O99210 Obesity complicating pregnancy, unspecified trimester: Secondary | ICD-10-CM

## 2018-06-07 DIAGNOSIS — O99323 Drug use complicating pregnancy, third trimester: Secondary | ICD-10-CM | POA: Diagnosis present

## 2018-06-07 DIAGNOSIS — E039 Hypothyroidism, unspecified: Secondary | ICD-10-CM | POA: Diagnosis present

## 2018-06-07 DIAGNOSIS — Z0130 Encounter for examination of blood pressure without abnormal findings: Secondary | ICD-10-CM

## 2018-06-07 DIAGNOSIS — Z9889 Other specified postprocedural states: Secondary | ICD-10-CM

## 2018-06-07 LAB — COMPREHENSIVE METABOLIC PANEL
ALBUMIN: 3.2 g/dL — AB (ref 3.5–5.0)
ALK PHOS: 62 U/L (ref 38–126)
ALT: 15 U/L (ref 0–44)
AST: 19 U/L (ref 15–41)
Anion gap: 8 (ref 5–15)
BUN: 7 mg/dL (ref 6–20)
CALCIUM: 8.4 mg/dL — AB (ref 8.9–10.3)
CO2: 20 mmol/L — AB (ref 22–32)
CREATININE: 0.7 mg/dL (ref 0.44–1.00)
Chloride: 107 mmol/L (ref 98–111)
GFR calc Af Amer: >60 mL/min (ref >60)
GFR calc non Af Amer: >60 mL/min (ref >60)
GLUCOSE: 156 mg/dL — AB (ref 70–99)
Potassium: 3 mmol/L — ABNORMAL LOW (ref 3.5–5.1)
SODIUM: 135 mmol/L (ref 135–145)
TOTAL PROTEIN: 6.5 g/dL (ref 6.5–8.1)
Total Bilirubin: 1 mg/dL (ref 0.3–1.2)

## 2018-06-07 LAB — TSH: TSH: 11.132 u[IU]/mL — ABNORMAL HIGH (ref 0.350–4.500)

## 2018-06-07 LAB — CBC
HCT: 30.6 % — ABNORMAL LOW (ref 36.0–46.0)
HEMOGLOBIN: 11 g/dL — AB (ref 12.0–15.0)
MCH: 32.5 pg (ref 26.0–34.0)
MCHC: 35.9 g/dL (ref 30.0–36.0)
MCV: 90.5 fL (ref 80.0–100.0)
Platelets: 219 10*3/uL (ref 150–400)
RBC: 3.38 MIL/uL — AB (ref 3.87–5.11)
RDW: 13.2 % (ref 11.5–15.5)
WBC: 9.8 10*3/uL (ref 4.0–10.5)
nRBC: 0 % (ref 0.0–0.2)

## 2018-06-07 LAB — TYPE AND SCREEN
ABO/RH(D): O POS
Antibody Screen: NEGATIVE

## 2018-06-07 LAB — T4, FREE: Free T4: 1.01 ng/dL (ref 0.82–1.77)

## 2018-06-07 LAB — PROTEIN / CREATININE RATIO, URINE
Creatinine, Urine: 185 mg/dL
PROTEIN CREATININE RATIO: 0.23 mg/mg{creat} — AB (ref 0.00–0.15)
Total Protein, Urine: 43 mg/dL

## 2018-06-07 MED ORDER — LABETALOL HCL 5 MG/ML IV SOLN: 20.0000 mg | INTRAVENOUS | Status: DC | PRN

## 2018-06-07 MED ORDER — BUTALBITAL-APAP-CAFFEINE 50-325-40 MG PO TABS
2.0000 | ORAL_TABLET | Freq: Once | ORAL | Status: DC
  Filled 2018-06-07: qty 2

## 2018-06-07 MED ORDER — ACETAMINOPHEN 325 MG PO TABS
650.0000 mg | ORAL_TABLET | ORAL | Status: DC | PRN
  Administered 2018-06-08: 650 mg via ORAL
  Filled 2018-06-07: qty 2

## 2018-06-07 MED ORDER — HYDRALAZINE HCL 20 MG/ML IJ SOLN: 5.0000 mg | INTRAMUSCULAR | Status: DC | PRN

## 2018-06-07 MED ORDER — NIFEDIPINE ER OSMOTIC RELEASE 30 MG PO TB24: 60.0000 mg | ORAL_TABLET | Freq: Two times a day (BID) | ORAL | Status: DC

## 2018-06-07 MED ORDER — PRENATAL MULTIVITAMIN CH
1.0000 | ORAL_TABLET | Freq: Every day | ORAL | Status: DC
  Administered 2018-06-07: 1 via ORAL
  Filled 2018-06-07: qty 1

## 2018-06-07 MED ORDER — DOCUSATE SODIUM 100 MG PO CAPS
100.0000 mg | ORAL_CAPSULE | Freq: Every day | ORAL | Status: DC
  Administered 2018-06-08: 100 mg via ORAL
  Filled 2018-06-07: qty 1

## 2018-06-07 MED ORDER — NIFEDIPINE ER OSMOTIC RELEASE 30 MG PO TB24
60.0000 mg | ORAL_TABLET | Freq: Two times a day (BID) | ORAL | Status: DC
  Administered 2018-06-07 – 2018-06-08 (×2): 60 mg via ORAL
  Filled 2018-06-07 (×2): qty 2

## 2018-06-07 MED ORDER — LEVOTHYROXINE SODIUM 100 MCG PO TABS
200.0000 ug | ORAL_TABLET | Freq: Every day | ORAL | Status: DC
  Administered 2018-06-08: 200 ug via ORAL
  Filled 2018-06-07 (×2): qty 2

## 2018-06-07 MED ORDER — BETAMETHASONE SOD PHOS & ACET 6 (3-3) MG/ML IJ SUSP
12.0000 mg | INTRAMUSCULAR | Status: AC
  Administered 2018-06-07 – 2018-06-08 (×2): 12 mg via INTRAMUSCULAR
  Filled 2018-06-07 (×2): qty 2

## 2018-06-07 MED ORDER — LACTATED RINGERS IV SOLN
INTRAVENOUS | Status: DC | PRN
  Administered 2018-06-07: 15:00:00 via INTRAVENOUS

## 2018-06-07 MED ORDER — HYDRALAZINE HCL 20 MG/ML IJ SOLN: 10.0000 mg | INTRAMUSCULAR | Status: DC | PRN

## 2018-06-07 MED ORDER — LABETALOL HCL 200 MG PO TABS: 200.0000 mg | ORAL_TABLET | Freq: Two times a day (BID) | ORAL | Status: DC

## 2018-06-07 MED ORDER — ASPIRIN EC 81 MG PO TBEC
81.0000 mg | DELAYED_RELEASE_TABLET | Freq: Every day | ORAL | Status: DC
  Administered 2018-06-07 – 2018-06-08 (×2): 81 mg via ORAL
  Filled 2018-06-07 (×3): qty 1

## 2018-06-07 MED ORDER — LABETALOL HCL 5 MG/ML IV SOLN: 40.0000 mg | INTRAVENOUS | Status: DC | PRN

## 2018-06-07 MED ORDER — CALCIUM CARBONATE ANTACID 500 MG PO CHEW: 2.0000 | CHEWABLE_TABLET | ORAL | Status: DC | PRN

## 2018-06-07 MED ORDER — ZOLPIDEM TARTRATE 5 MG PO TABS: 5.0000 mg | ORAL_TABLET | Freq: Every evening | ORAL | Status: DC | PRN

## 2018-06-07 MED ORDER — FAMOTIDINE 20 MG PO TABS
20.0000 mg | ORAL_TABLET | Freq: Two times a day (BID) | ORAL | Status: DC
  Administered 2018-06-07 – 2018-06-08 (×2): 20 mg via ORAL
  Filled 2018-06-07 (×2): qty 1

## 2018-06-07 MED ORDER — LABETALOL HCL 200 MG PO TABS
200.0000 mg | ORAL_TABLET | Freq: Two times a day (BID) | ORAL | Status: DC
  Administered 2018-06-07 – 2018-06-08 (×2): 200 mg via ORAL
  Filled 2018-06-07 (×2): qty 1

## 2018-06-07 NOTE — Progress Notes (Signed)
[name] is a 32 y.o. female here for BP check. She is [redacted]w[redacted]d and her BP is 186/108 manually.  Pt taking Nifedipine 60mg  BID as prescribed. Pt denies symptoms today but does report that last night she had a headache and was feeling very sluggish.  Discussed with Dr. Harolyn Rutherford who gave instructions for pt to be taken to MAU.  Pt brought in the cuff she uses at home to check her blood pressure which has been showing high readings when she checks.   Instructed pt to demonstrate how she uses her cuff. Pt tried to put cuff on upper arm but the cuff was too small.  Educated pt that if cuff is too small, then that can cause a false elevation in blood pressure.  instructed pt to use this cuff on her forearm.  Pre-eclampsia symptoms reviewed with pt as well as reinforced to the pt the importance of coming to MAU whenever her blood pressure reads that high.  Pt verbalized understanding.

## 2018-06-07 NOTE — Plan of Care (Signed)
Will monitor pt. Throughout the PM shift. Blood pressures stable for now. FHR reassuring. Pt. Given diet (per request). NST will be done q shift or if pt. Reports changes.

## 2018-06-07 NOTE — Progress Notes (Signed)
I have reviewed the chart and agree with nursing staff's documentation of this patient's encounter.  Verita Schneiders, MD 06/07/2018 1:09 PM

## 2018-06-07 NOTE — MAU Note (Signed)
Patient presents to MAU from Clinic for BP 186/108 with no other symptoms today, but had a HA last night.

## 2018-06-07 NOTE — H&P (Signed)
Obstetric History and Physical  [name] is a 32 y.o. G3P1011 with IUP at 93w4dpresenting for elevateds BP in office. H/o chronic HTN on procardia 60 mg BID XL, mildly eleveated BP in MAU today but has had intermittent headache since Thursday. None curretnly. H/o hypothyroidism s/p thyroidectomy for thyroid cancer with non-controlled hypothyroidism. States she has been compliant with all meds. . Patient states she has been having  none contractions, none vaginal bleeding, intact membranes, with active fetal movement.    Prenatal Course Pregnancy complications or risks: Patient Active Problem List   Diagnosis Date Noted  . Chronic hypertension during pregnancy, antepartum 06/07/2018  . Substance abuse affecting pregnancy in third trimester, antepartum 05/18/2018  . RUQ pain 05/07/2018  . Other headache syndrome 05/07/2018  . Obesity, Class III, BMI 40-49.9 (morbid obesity) ([place] 12/30/2017  . Obesity affecting pregnancy, antepartum 12/30/2017  . Hypothyroidism during pregnancy, antepartum 12/30/2017  . Supervision of high risk pregnancy, antepartum 12/16/2017  . Chronic hypertension in pregnancy 12/16/2017  . History of cesarean delivery 12/16/2017  . 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  . Hematuria 11/11/2017  . Hypertension   . 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 Thyroid cancer ([place] 09/11/2016   Medical History:  Past Medical History:  Diagnosis Date  . Anxiety   . Cancer ([place] 02/2012   thyroid cancer  . Hypertension   . Thyroid disease    cancer    Past Surgical History:  Procedure Laterality Date  . CESAREAN SECTION  2006  . DILATION AND EVACUATION N/A 02/07/2015   Procedure: DILATATION AND EVACUATION;  Surgeon: CLavonia Drafts MD;  [facility];  Service: Gynecology;  Laterality: N/A;  . THYROIDECTOMY  02/2012   total L side first and R side 7 days later.    OB History  Gravida Para Term Preterm AB Living  '3 1 1   1 1  ' SAB  TAB Ectopic Multiple Live Births  1     0 1    # Outcome Date GA Lbr Len/2nd Weight Sex Delivery Anes PTL Lv  3 Current           2 SAB 2017             Birth Comments: had d & C  1 Term 10/02/04 464w0d F CS-Unspec   LIV     Birth Comments: c/s - not sure why- but thinks it was due to either baby hr or her hr    Social History   Socioeconomic History  . Marital status: Single    Spouse name: Not on file  . Number of children: Not on file  . Years of education: Not on file  . Highest education level: Not on file  Occupational History  . Not on file  Social Needs  . Financial resource strain: Not on file  . Food insecurity:    Worry: Not on file    Inability: Not on file  . Transportation needs:    Medical: Not on file    Non-medical: Not on file  Tobacco Use  . Smoking status: Never Smoker  . Smokeless tobacco: Never Used  Substance and Sexual Activity  . Alcohol use: No  . Drug use: Yes    Types: Marijuana    Comment: last used 05/19/18  . Sexual activity: Yes    Birth control/protection: None  Lifestyle  . Physical activity:    Days per week: Not on file    Minutes per session: Not  on file  . Stress: Not on file  Relationships  . Social connections:    Talks on phone: Not on file    Gets together: Not on file    Attends religious service: Not on file    Active member of club or organization: Not on file    Attends meetings of clubs or organizations: Not on file    Relationship status: Not on file  Other Topics Concern  . Not on file  Social History Narrative  . Not on file    Family History  Problem Relation Age of Onset  . Hypertension Mother   . Blindness Mother        one eye    Medications Prior to Admission  Medication Sig Dispense Refill Last Dose  . acetaminophen (TYLENOL) 325 MG tablet Take 650 mg by mouth every 6 (six) hours as needed for mild pain or headache.   06/04/2018 at Unknown time  . aspirin EC 81 MG tablet Take 1 tablet (81 mg  total) by mouth daily. 30 tablet 1 Past Week at Unknown time  . Blood Pressure Monitoring (ADULT BLOOD PRESSURE CUFF LG) KIT 1 kit by Does not apply route every morning. 1 each 0 Taking  . docusate sodium (COLACE) 100 MG capsule Take 1 capsule (100 mg total) by mouth 2 (two) times daily as needed. (Patient taking differently: Take 100 mg by mouth 2 (two) times daily as needed for mild constipation. ) 30 capsule 2 Past Month at Unknown time  . famotidine (PEPCID) 20 MG tablet TAKE 1 TABLET(20 MG) BY MOUTH TWICE DAILY 60 tablet 0   . famotidine (PEPCID) 20 MG tablet Take 1 tablet (20 mg total) by mouth 2 (two) times daily. 60 tablet 3   . levothyroxine (SYNTHROID, LEVOTHROID) 200 MCG tablet Take 1 tablet (200 mcg total) by mouth daily before breakfast. 90 tablet 1 06/04/2018 at Unknown time  . NIFEdipine (PROCARDIA XL/NIFEDICAL XL) 60 MG 24 hr tablet Take 1 tablet (60 mg total) by mouth 2 (two) times daily. 60 tablet 0     Allergies  Allergen Reactions  . Hydrocodone Nausea And Vomiting    Review of Systems: Negative except for what is mentioned in HPI.  Physical Exam: BP (!) 155/101   Pulse (!) 113   Temp 98.4 F (36.9 C) (Oral)   Resp 20   Wt 115.3 kg   LMP 10/08/2017   SpO2 100%   BMI 46.50 kg/m  CONSTITUTIONAL: Well-developed, well-nourished female in no acute distress.  HENT:  Normocephalic, atraumatic, External right and left ear normal. Oropharynx is clear and moist EYES: Conjunctivae and EOM are normal. Pupils are equal, round, and reactive to light. No scleral icterus.  NECK: Normal range of motion, supple, no masses SKIN: Skin is warm and dry. No rash noted. Not diaphoretic. No erythema. No pallor. NEUROLOGIC: Alert and oriented to person, place, and time. Normal reflexes, muscle tone coordination. No cranial nerve deficit noted. PSYCHIATRIC: Normal mood and affect. Normal behavior. Normal judgment and thought content. CARDIOVASCULAR: Normal heart rate noted, regular  rhythm RESPIRATORY: Effort and breath sounds normal, no problems with respiration noted ABDOMEN: Soft, nontender, nondistended, gravid. MUSCULOSKELETAL: Normal range of motion. No edema and no tenderness. 2+ distal pulses.  Cervical Exam: deferred   Presentation: unknown FHT:  Baseline rate 140 bpm   Variability moderate  Accelerations present   Decelerations none Contractions: none   Pertinent Labs/Studies:   Results for orders placed or performed during the hospital encounter of  06/07/18 (from the past 24 hour(s))  CBC     Status: Abnormal   Collection Time: 06/07/18 11:45 AM  Result Value Ref Range   WBC 9.8 4.0 - 10.5 K/uL   RBC 3.38 (L) 3.87 - 5.11 MIL/uL   Hemoglobin 11.0 (L) 12.0 - 15.0 g/dL   HCT 30.6 (L) 36.0 - 46.0 %   MCV 90.5 80.0 - 100.0 fL   MCH 32.5 26.0 - 34.0 pg   MCHC 35.9 30.0 - 36.0 g/dL   RDW 13.2 11.5 - 15.5 %   Platelets 219 150 - 400 K/uL   nRBC 0.0 0.0 - 0.2 %  Comprehensive metabolic panel     Status: Abnormal   Collection Time: 06/07/18 11:45 AM  Result Value Ref Range   Sodium 135 135 - 145 mmol/L   Potassium 3.0 (L) 3.5 - 5.1 mmol/L   Chloride 107 98 - 111 mmol/L   CO2 20 (L) 22 - 32 mmol/L   Glucose, Bld 156 (H) 70 - 99 mg/dL   BUN 7 6 - 20 mg/dL   Creatinine, Ser 0.70 0.44 - 1.00 mg/dL   Calcium 8.4 (L) 8.9 - 10.3 mg/dL   Total Protein 6.5 6.5 - 8.1 g/dL   Albumin 3.2 (L) 3.5 - 5.0 g/dL   AST 19 15 - 41 U/L   ALT 15 0 - 44 U/L   Alkaline Phosphatase 62 38 - 126 U/L   Total Bilirubin 1.0 0.3 - 1.2 mg/dL   GFR calc non Af Amer >60 >60 mL/min   GFR calc Af Amer >60 >60 mL/min   Anion gap 8 5 - 15  Protein / creatinine ratio, urine     Status: Abnormal   Collection Time: 06/07/18 11:59 AM  Result Value Ref Range   Creatinine, Urine 185.00 mg/dL   Total Protein, Urine 43 mg/dL   Protein Creatinine Ratio 0.23 (H) 0.00 - 0.15 mg/mg[Cre]    Assessment : [name] is a 32 y.o. G3P1011 at 58w4dbeing admitted for BP monitoring in  the setting of chronic HTN, hypothyroidism not controlled. H/o 1 CS, would like repeat.  Plan:  HTN - labs wnl, UPCr pending - will admit for monitoring and titrating of BP meds - IV labetalol per protocol for BP management - MgSO4 for PEC with severe features  FWB  - Cat I fetal heart tracing   - will consider BTMZ - BPP  Hypothyroidism - last TSH 142, now on synthroid 200 mcg - states compliance with meds - repeat TSH/T4/T3 today   KFeliz Beam M.D. [facility] 06/07/2018, 2:20 PM

## 2018-06-08 LAB — T3, FREE: T3 FREE: 2.9 pg/mL (ref 2.0–4.4)

## 2018-06-08 MED ORDER — LABETALOL HCL 200 MG PO TABS: 200.0000 mg | ORAL_TABLET | Freq: Two times a day (BID) | ORAL | 3 refills | Status: DC

## 2018-06-08 MED ORDER — POTASSIUM CHLORIDE CRYS ER 20 MEQ PO TBCR
20.0000 meq | EXTENDED_RELEASE_TABLET | Freq: Two times a day (BID) | ORAL | Status: DC
  Filled 2018-06-08 (×3): qty 1

## 2018-06-08 MED ORDER — LEVOTHYROXINE SODIUM 25 MCG PO TABS: 25.0000 ug | ORAL_TABLET | Freq: Every day | ORAL | 3 refills | Status: DC

## 2018-06-08 NOTE — Progress Notes (Signed)
Discharge paperwork given to patient.  Discussed medication changes including labetalol and synthroid.  Discussed symptoms of hypertension.  Patient verbalized understanding.

## 2018-06-08 NOTE — Discharge Instructions (Signed)

## 2018-06-08 NOTE — Discharge Summary (Signed)
Antenatal Physician Discharge Summary  Patient ID: [name] MRN: [id number] DOB/AGE: 09-21-1985 32 y.o.  Admit date: 06/07/2018 Discharge date: 06/09/2018  Admission Diagnoses: worsening chronic HTN  Discharge Diagnoses:  Principal Problem:   Chronic hypertension during pregnancy, antepartum Active Problems:   Supervision of high risk pregnancy, antepartum   History of cesarean delivery   Status post complete thyroidectomy   Obesity, Class III, BMI 40-49.9 (morbid obesity) ([place])   Hypothyroidism during pregnancy, antepartum   Substance abuse affecting pregnancy in third trimester, antepartum   Prenatal Procedures: NST and ultrasound  Consults: Maternal Fetal Medicine  Hospital Course:  This is a 32 y.o. G3P1011 with IUP at 43w5dadmitted for worsening of chronic HTN. BP not well controlled on one agent, she was admitted for BP control. Pre-eclampsia lab work was within normal limits. BP improved significantly with the additional of a 2nd agent. Repeat thyroid lab work showed sub-optimal control of hypothyroidism and her levothyroxine was increased to 225 mcg daily. She was discharged home on HD#2 with very good control of BP. She has an appointment for the next day. Instructions for follow up given.    Discharge Exam: Temp:  [98 F (36.7 C)-98.4 F (36.9 C)] 98.4 F (36.9 C) (11/05 1608) Pulse Rate:  [105-110] 110 (11/05 1608) Resp:  [18] 18 (11/05 1608) BP: (124-127)/(76-88) 124/76 (11/05 1608) SpO2:  [100 %] 100 % (11/05 1608) Physical Examination: CONSTITUTIONAL: Well-developed, well-nourished female in no acute distress.  HENT:  Normocephalic, atraumatic, External right and left ear normal. Oropharynx is clear and moist EYES: Conjunctivae and EOM are normal. Pupils are equal, round, and reactive to light. No scleral icterus.  NECK: Normal range of motion, supple, no masses SKIN: Skin is warm and dry. No rash noted. Not diaphoretic. No erythema. No  pallor. NJoiner Alert and oriented to person, place, and time. Normal reflexes, muscle tone coordination. No cranial nerve deficit noted. PSYCHIATRIC: Normal mood and affect. Normal behavior. Normal judgment and thought content. CARDIOVASCULAR: Normal heart rate noted, regular rhythm RESPIRATORY: Effort and breath sounds normal, no problems with respiration noted MUSCULOSKELETAL: Normal range of motion. No edema and no tenderness. 2+ distal pulses. ABDOMEN: Soft, nontender, nondistended, gravid. CERVIX:  deferred  Fetal monitoring: reactive NST Uterine activity: no contractions per hour  Significant Diagnostic Studies:  Results for orders placed or performed during the hospital encounter of 06/07/18 (from the past 168 hour(s))  CBC   Collection Time: 06/07/18 11:45 AM  Result Value Ref Range   WBC 9.8 4.0 - 10.5 K/uL   RBC 3.38 (L) 3.87 - 5.11 MIL/uL   Hemoglobin 11.0 (L) 12.0 - 15.0 g/dL   HCT 30.6 (L) 36.0 - 46.0 %   MCV 90.5 80.0 - 100.0 fL   MCH 32.5 26.0 - 34.0 pg   MCHC 35.9 30.0 - 36.0 g/dL   RDW 13.2 11.5 - 15.5 %   Platelets 219 150 - 400 K/uL   nRBC 0.0 0.0 - 0.2 %  Comprehensive metabolic panel   Collection Time: 06/07/18 11:45 AM  Result Value Ref Range   Sodium 135 135 - 145 mmol/L   Potassium 3.0 (L) 3.5 - 5.1 mmol/L   Chloride 107 98 - 111 mmol/L   CO2 20 (L) 22 - 32 mmol/L   Glucose, Bld 156 (H) 70 - 99 mg/dL   BUN 7 6 - 20 mg/dL   Creatinine, Ser 0.70 0.44 - 1.00 mg/dL   Calcium 8.4 (L) 8.9 - 10.3 mg/dL   Total Protein 6.5  6.5 - 8.1 g/dL   Albumin 3.2 (L) 3.5 - 5.0 g/dL   AST 19 15 - 41 U/L   ALT 15 0 - 44 U/L   Alkaline Phosphatase 62 38 - 126 U/L   Total Bilirubin 1.0 0.3 - 1.2 mg/dL   GFR calc non Af Amer >60 >60 mL/min   GFR calc Af Amer >60 >60 mL/min   Anion gap 8 5 - 15  TSH   Collection Time: 06/07/18 11:45 AM  Result Value Ref Range   TSH 11.132 (H) 0.350 - 4.500 uIU/mL  T3, free   Collection Time: 06/07/18 11:45 AM  Result Value Ref  Range   T3, Free 2.9 2.0 - 4.4 pg/mL  T4, free   Collection Time: 06/07/18 11:45 AM  Result Value Ref Range   Free T4 1.01 0.82 - 1.77 ng/dL  Protein / creatinine ratio, urine   Collection Time: 06/07/18 11:59 AM  Result Value Ref Range   Creatinine, Urine 185.00 mg/dL   Total Protein, Urine 43 mg/dL   Protein Creatinine Ratio 0.23 (H) 0.00 - 0.15 mg/mg[Cre]  Type and screen [place]   Collection Time: 06/07/18  2:17 PM  Result Value Ref Range   ABO/RH(D) O POS    Antibody Screen NEG    Sample Expiration      06/10/2018 Performed at [facility], [address] [place], [place] [address]   Results for orders placed or performed during the hospital encounter of 06/04/18 (from the past 168 hour(s))  CBC with Differential/Platelet   Collection Time: 06/04/18  8:18 PM  Result Value Ref Range   WBC 10.0 4.0 - 10.5 K/uL   RBC 3.60 (L) 3.87 - 5.11 MIL/uL   Hemoglobin 11.5 (L) 12.0 - 15.0 g/dL   HCT 32.7 (L) 36.0 - 46.0 %   MCV 90.8 80.0 - 100.0 fL   MCH 31.9 26.0 - 34.0 pg   MCHC 35.2 30.0 - 36.0 g/dL   RDW 12.8 11.5 - 15.5 %   Platelets 237 150 - 400 K/uL   nRBC 0.0 0.0 - 0.2 %   Neutrophils Relative % 70 %   Neutro Abs 6.9 1.7 - 7.7 K/uL   Lymphocytes Relative 21 %   Lymphs Abs 2.1 0.7 - 4.0 K/uL   Monocytes Relative 8 %   Monocytes Absolute 0.8 0.1 - 1.0 K/uL   Eosinophils Relative 0 %   Eosinophils Absolute 0.0 0.0 - 0.5 K/uL   Basophils Relative 0 %   Basophils Absolute 0.0 0.0 - 0.1 K/uL   Immature Granulocytes 1 %   Abs Immature Granulocytes 0.14 (H) 0.00 - 0.07 K/uL  Urinalysis, Routine w reflex microscopic   Collection Time: 06/04/18  8:18 PM  Result Value Ref Range   Color, Urine YELLOW YELLOW   APPearance HAZY (A) CLEAR   Specific Gravity, Urine 1.016 1.005 - 1.030   pH 6.0 5.0 - 8.0   Glucose, UA NEGATIVE NEGATIVE mg/dL   Hgb urine dipstick MODERATE (A) NEGATIVE   Bilirubin Urine NEGATIVE NEGATIVE   Ketones, ur NEGATIVE NEGATIVE  mg/dL   Protein, ur NEGATIVE NEGATIVE mg/dL   Nitrite NEGATIVE NEGATIVE   Leukocytes, UA TRACE (A) NEGATIVE   RBC / HPF 6-10 0 - 5 RBC/hpf   WBC, UA 0-5 0 - 5 WBC/hpf   Bacteria, UA RARE (A) NONE SEEN   Squamous Epithelial / LPF 0-5 0 - 5   Mucus PRESENT    Budding Yeast PRESENT   Rapid urine  drug screen (hospital performed)   Collection Time: 06/04/18  8:18 PM  Result Value Ref Range   Opiates NONE DETECTED NONE DETECTED   Cocaine NONE DETECTED NONE DETECTED   Benzodiazepines NONE DETECTED NONE DETECTED   Amphetamines NONE DETECTED NONE DETECTED   Tetrahydrocannabinol POSITIVE (A) NONE DETECTED   Barbiturates NONE DETECTED NONE DETECTED  Comprehensive metabolic panel   Collection Time: 06/04/18  8:18 PM  Result Value Ref Range   Sodium 141 135 - 145 mmol/L   Potassium 3.3 (L) 3.5 - 5.1 mmol/L   Chloride 109 98 - 111 mmol/L   CO2 23 22 - 32 mmol/L   Glucose, Bld 91 70 - 99 mg/dL   BUN 7 6 - 20 mg/dL   Creatinine, Ser 0.63 0.44 - 1.00 mg/dL   Calcium 9.3 8.9 - 10.3 mg/dL   Total Protein 6.9 6.5 - 8.1 g/dL   Albumin 3.5 3.5 - 5.0 g/dL   AST 18 15 - 41 U/L   ALT 12 0 - 44 U/L   Alkaline Phosphatase 68 38 - 126 U/L   Total Bilirubin 0.7 0.3 - 1.2 mg/dL   GFR calc non Af Amer >60 >60 mL/min   GFR calc Af Amer >60 >60 mL/min   Anion gap 9 5 - 15  Uric acid   Collection Time: 06/04/18  8:18 PM  Result Value Ref Range   Uric Acid, Serum 4.4 2.5 - 7.1 mg/dL  Protein / creatinine ratio, urine   Collection Time: 06/04/18  8:41 PM  Result Value Ref Range   Creatinine, Urine 164.00 mg/dL   Total Protein, Urine 30 mg/dL   Protein Creatinine Ratio 0.18 (H) 0.00 - 0.15 mg/mg[Cre]   Discharge Condition: Stable  Disposition: Discharge disposition: 01-Home or Self Care      Discharge Instructions    Notify physician for a general feeling that "something is not right"   Complete by:  As directed    Notify physician for increase or change in vaginal discharge   Complete by:   As directed    Notify physician for intestinal cramps, with or without diarrhea, sometimes described as "gas pain"   Complete by:  As directed    Notify physician for leaking of fluid   Complete by:  As directed    Notify physician for low, dull backache, unrelieved by heat or Tylenol   Complete by:  As directed    Notify physician for menstrual like cramps   Complete by:  As directed    Notify physician for pelvic pressure   Complete by:  As directed    Notify physician for uterine contractions.  These may be painless and feel like the uterus is tightening or the baby is  "balling up"   Complete by:  As directed    Notify physician for vaginal bleeding   Complete by:  As directed    PRETERM LABOR:  Includes any of the follwing symptoms that occur between 20 - [redacted] weeks gestation.  If these symptoms are not stopped, preterm labor can result in preterm delivery, placing your baby at risk   Complete by:  As directed      Allergies as of 06/08/2018      Reactions   Hydrocodone Nausea And Vomiting   Pt says she is not allergic, she stated allergy so the ED wouldn't medicate her.       Medication List    TAKE these medications   acetaminophen 500 MG tablet Commonly known as:  TYLENOL Take 1,000 mg by mouth every 8 (eight) hours as needed for headache.   Adult Blood Pressure Cuff Lg Kit 1 kit by Does not apply route every morning.   aspirin EC 81 MG tablet Take 1 tablet (81 mg total) by mouth daily.   bisacodyl 10 MG suppository Commonly known as:  DULCOLAX Place 10 mg rectally as needed for moderate constipation.   docusate sodium 100 MG capsule Commonly known as:  COLACE Take 1 capsule (100 mg total) by mouth 2 (two) times daily as needed.   famotidine 20 MG tablet Commonly known as:  PEPCID Take 1 tablet (20 mg total) by mouth 2 (two) times daily.   famotidine 20 MG tablet Commonly known as:  PEPCID TAKE 1 TABLET(20 MG) BY MOUTH TWICE DAILY   labetalol 200 MG  tablet Commonly known as:  NORMODYNE Take 1 tablet (200 mg total) by mouth 2 (two) times daily.   levothyroxine 200 MCG tablet Commonly known as:  SYNTHROID, LEVOTHROID Take 1 tablet (200 mcg total) by mouth daily before breakfast. What changed:  Another medication with the same name was added. Make sure you understand how and when to take each.   levothyroxine 25 MCG tablet Commonly known as:  SYNTHROID, LEVOTHROID Take 1 tablet (25 mcg total) by mouth daily before breakfast. What changed:  You were already taking a medication with the same name, and this prescription was added. Make sure you understand how and when to take each.   NIFEdipine 60 MG 24 hr tablet Commonly known as:  PROCARDIA XL/NIFEDICAL XL Take 1 tablet (60 mg total) by mouth 2 (two) times daily.      Follow-up [place] [facility] [place] Follow up on 06/09/2018.   Specialty:  Obstetrics and Gynecology Contact information: [address] [place] [phone]          Signed: Feliz Beam, M.D. [facility]  06/09/2018, 8:52 AM

## 2018-06-09 ENCOUNTER — Other Ambulatory Visit: Payer: Medicaid Other

## 2018-06-09 ENCOUNTER — Ambulatory Visit (INDEPENDENT_AMBULATORY_CARE_PROVIDER_SITE_OTHER): Payer: Medicaid Other | Admitting: Family Medicine

## 2018-06-09 VITALS — BP 163/97 | HR 98 | Wt 258.6 lb

## 2018-06-09 DIAGNOSIS — O99280 Endocrine, nutritional and metabolic diseases complicating pregnancy, unspecified trimester: Secondary | ICD-10-CM

## 2018-06-09 DIAGNOSIS — O99283 Endocrine, nutritional and metabolic diseases complicating pregnancy, third trimester: Secondary | ICD-10-CM

## 2018-06-09 DIAGNOSIS — O10919 Unspecified pre-existing hypertension complicating pregnancy, unspecified trimester: Secondary | ICD-10-CM

## 2018-06-09 DIAGNOSIS — O0990 Supervision of high risk pregnancy, unspecified, unspecified trimester: Secondary | ICD-10-CM

## 2018-06-09 DIAGNOSIS — E039 Hypothyroidism, unspecified: Secondary | ICD-10-CM

## 2018-06-09 DIAGNOSIS — O10913 Unspecified pre-existing hypertension complicating pregnancy, third trimester: Secondary | ICD-10-CM

## 2018-06-09 DIAGNOSIS — Z98891 History of uterine scar from previous surgery: Secondary | ICD-10-CM

## 2018-06-09 DIAGNOSIS — O0993 Supervision of high risk pregnancy, unspecified, third trimester: Secondary | ICD-10-CM

## 2018-06-09 LAB — POCT URINALYSIS DIP (DEVICE)
Bilirubin Urine: NEGATIVE
Glucose, UA: NEGATIVE mg/dL
Ketones, ur: NEGATIVE mg/dL
NITRITE: NEGATIVE
PH: 6 (ref 5.0–8.0)
PROTEIN: 100 mg/dL — AB
UROBILINOGEN UA: 0.2 mg/dL (ref 0.0–1.0)

## 2018-06-09 NOTE — Progress Notes (Signed)
   PRENATAL VISIT NOTE  Subjective:  [name] is a 32 y.o. G3P1011 at [redacted]w[redacted]d being seen today for ongoing prenatal care.  She is currently monitored for the following issues for this high-risk pregnancy and has Hypertension; Postoperative hypothyroidism; Thyroid cancer ([place]); Hematuria; Supervision of high risk pregnancy, antepartum; Chronic hypertension in pregnancy; History of cesarean delivery; Status post complete thyroidectomy; Obesity, Class III, BMI 40-49.9 (morbid obesity) ([place]); Obesity affecting pregnancy, antepartum; Hypothyroidism during pregnancy, antepartum; RUQ pain; Other headache syndrome; Substance abuse affecting pregnancy in third trimester, antepartum; and Chronic hypertension during pregnancy, antepartum on their problem list.  Patient reports no complaints.  Contractions: Not present. Vag. Bleeding: None.  Movement: Present. Denies leaking of fluid.   The following portions of the patient's history were reviewed and updated as appropriate: allergies, current medications, past family history, past medical history, past social history, past surgical history and problem list. Problem list updated.  Objective:   Vitals:   06/09/18 1014 06/09/18 1023  BP: (!) 155/97 (!) 163/97  Pulse: 98   Weight: 258 lb 9.6 oz (117.3 kg)     Fetal Status: Fetal Heart Rate (bpm): 145   Movement: Present     General:  Alert, oriented and cooperative. Patient is in no acute distress.  Skin: Skin is warm and dry. No rash noted.   Cardiovascular: Normal heart rate noted  Respiratory: Normal respiratory effort, no problems with respiration noted  Abdomen: Soft, gravid, appropriate for gestational age.  Pain/Pressure: Absent     Pelvic: Cervical exam deferred        Extremities: Normal range of motion.  Edema: None  Mental Status: Normal mood and affect. Normal behavior. Normal judgment and thought content.   Assessment and Plan:  Pregnancy: G3P1011 at [redacted]w[redacted]d  1. Chronic  hypertension during pregnancy, antepartum BP ok--taking meds--recent normal labs and admission. Discussed diet, rest, warning signs.  2. Hypothyroidism during pregnancy, antepartum Improved--continue on 225 mcg daily  3. Supervision of high risk pregnancy, antepartum   4. History of cesarean delivery Scheduled for RCS  Preterm labor symptoms and general obstetric precautions including but not limited to vaginal bleeding, contractions, leaking of fluid and fetal movement were reviewed in detail with the patient. Please refer to After Visit Summary for other counseling recommendations.  Return for needs MD, OB visit and BPP.  Future Appointments  Date Time Provider [place]  06/14/2018  8:00 AM WH-MFC [place] 3 WH-MFCUS MFC-US  06/14/2018 11:15 AM WOC-WOCA NST WOC-WOCA WOC  06/23/2018 10:15 AM WOC-WOCA NST WOC-WOCA WOC  06/23/2018 11:15 AM Donnamae Jude, MD WOC-WOCA WOC  06/28/2018  9:15 AM WOC-WOCA NST WOC-WOCA WOC  06/28/2018 10:15 AM Woodroe Mode, MD WOC-WOCA WOC  07/05/2018 10:15 AM WOC-WOCA NST WOC-WOCA WOC  07/05/2018 11:15 AM Woodroe Mode, MD WOC-WOCA WOC    Donnamae Jude, MD

## 2018-06-09 NOTE — Patient Instructions (Signed)

## 2018-06-14 ENCOUNTER — Other Ambulatory Visit: Payer: Medicaid Other

## 2018-06-14 ENCOUNTER — Ambulatory Visit (HOSPITAL_COMMUNITY)
Admission: RE | Admit: 2018-06-14 | Discharge: 2018-06-14 | Disposition: A | Discharge status: Home / Self Care | Payer: Medicaid Other | Source: Ambulatory Visit | Attending: Family Medicine | Admitting: Family Medicine

## 2018-06-14 ENCOUNTER — Encounter (HOSPITAL_COMMUNITY): Payer: Self-pay

## 2018-06-14 DIAGNOSIS — Z98891 History of uterine scar from previous surgery: Secondary | ICD-10-CM

## 2018-06-14 DIAGNOSIS — O99283 Endocrine, nutritional and metabolic diseases complicating pregnancy, third trimester: Secondary | ICD-10-CM | POA: Diagnosis not present

## 2018-06-14 DIAGNOSIS — O0990 Supervision of high risk pregnancy, unspecified, unspecified trimester: Secondary | ICD-10-CM

## 2018-06-14 DIAGNOSIS — Z3A35 35 weeks gestation of pregnancy: Secondary | ICD-10-CM | POA: Diagnosis not present

## 2018-06-14 DIAGNOSIS — O34219 Maternal care for unspecified type scar from previous cesarean delivery: Secondary | ICD-10-CM

## 2018-06-14 DIAGNOSIS — O99280 Endocrine, nutritional and metabolic diseases complicating pregnancy, unspecified trimester: Secondary | ICD-10-CM

## 2018-06-14 DIAGNOSIS — O99210 Obesity complicating pregnancy, unspecified trimester: Secondary | ICD-10-CM

## 2018-06-14 DIAGNOSIS — E039 Hypothyroidism, unspecified: Secondary | ICD-10-CM

## 2018-06-14 DIAGNOSIS — O10919 Unspecified pre-existing hypertension complicating pregnancy, unspecified trimester: Secondary | ICD-10-CM | POA: Diagnosis present

## 2018-06-14 DIAGNOSIS — O10013 Pre-existing essential hypertension complicating pregnancy, third trimester: Secondary | ICD-10-CM | POA: Insufficient documentation

## 2018-06-14 DIAGNOSIS — O99213 Obesity complicating pregnancy, third trimester: Secondary | ICD-10-CM

## 2018-06-18 ENCOUNTER — Other Ambulatory Visit (HOSPITAL_COMMUNITY)
Admission: RE | Admit: 2018-06-18 | Discharge: 2018-06-18 | Disposition: A | Discharge status: Home / Self Care | Payer: Medicaid Other | Source: Ambulatory Visit | Attending: Student | Admitting: Student

## 2018-06-18 ENCOUNTER — Ambulatory Visit: Payer: Medicaid Other

## 2018-06-18 ENCOUNTER — Ambulatory Visit (INDEPENDENT_AMBULATORY_CARE_PROVIDER_SITE_OTHER): Payer: Medicaid Other | Admitting: Student

## 2018-06-18 VITALS — BP 150/100 | HR 95 | Ht 62.0 in | Wt 258.5 lb

## 2018-06-18 VITALS — BP 150/100 | HR 95 | Wt 258.5 lb

## 2018-06-18 DIAGNOSIS — N898 Other specified noninflammatory disorders of vagina: Secondary | ICD-10-CM | POA: Insufficient documentation

## 2018-06-18 DIAGNOSIS — B373 Candidiasis of vulva and vagina: Secondary | ICD-10-CM

## 2018-06-18 DIAGNOSIS — O10919 Unspecified pre-existing hypertension complicating pregnancy, unspecified trimester: Secondary | ICD-10-CM

## 2018-06-18 DIAGNOSIS — O0993 Supervision of high risk pregnancy, unspecified, third trimester: Secondary | ICD-10-CM

## 2018-06-18 DIAGNOSIS — B3731 Acute candidiasis of vulva and vagina: Secondary | ICD-10-CM

## 2018-06-18 DIAGNOSIS — O26893 Other specified pregnancy related conditions, third trimester: Secondary | ICD-10-CM | POA: Insufficient documentation

## 2018-06-18 DIAGNOSIS — O10913 Unspecified pre-existing hypertension complicating pregnancy, third trimester: Secondary | ICD-10-CM

## 2018-06-18 DIAGNOSIS — O0990 Supervision of high risk pregnancy, unspecified, unspecified trimester: Secondary | ICD-10-CM

## 2018-06-18 DIAGNOSIS — Z0130 Encounter for examination of blood pressure without abnormal findings: Secondary | ICD-10-CM

## 2018-06-18 MED ORDER — TERCONAZOLE 0.8 % VA CREA: 1.0000 | TOPICAL_CREAM | Freq: Every day | VAGINAL | 0 refills | Status: DC

## 2018-06-18 NOTE — Progress Notes (Signed)
Pt here for BP check, pt states last night her BP read 158/108.Marland Kitchenadvised any time it's that high to please go to MAU. Pt states was also having heart palpitations also & pain under her left breast area, also having fluid leaking she stated.. BP today is 150/103 on right arm/ Left arm : 150/100.Will review w/ provider

## 2018-06-18 NOTE — Patient Instructions (Signed)

## 2018-06-18 NOTE — Progress Notes (Signed)
   PRENATAL VISIT NOTE  Subjective:  [name] is a 32 y.o. G3P1011 at [redacted]w[redacted]d being seen today for problem visit.  She is currently monitored for the following issues for this high-risk pregnancy and has Hypertension; Postoperative hypothyroidism; Thyroid cancer ([place]); Hematuria; Supervision of high risk pregnancy, antepartum; Chronic hypertension in pregnancy; History of cesarean delivery; Status post complete thyroidectomy; Obesity, Class III, BMI 40-49.9 (morbid obesity) ([place]); Obesity affecting pregnancy, antepartum; Hypothyroidism during pregnancy, antepartum; RUQ pain; Other headache syndrome; Substance abuse affecting pregnancy in third trimester, antepartum; and Chronic hypertension during pregnancy, antepartum on their problem list.  Patient reports vaginal irritation. Patient initially on RN schedule for BP check but is also being evaluated d/t complaint of vaginal discharge and irritation. States discharge was initially milky white but has thinned out this morning. Hasn't treated symptoms.   The following portions of the patient's history were reviewed and updated as appropriate: allergies, current medications, past family history, past medical history, past social history, past surgical history and problem list. Problem list updated.  Objective:   Vitals:   06/18/18 1123 06/18/18 1124  BP: (!) 150/103 (!) 150/100  Pulse: 92 95  Weight:  258 lb 8 oz (117.3 kg)    General:  Alert, oriented and cooperative. Patient is in no acute distress.  Skin: Skin is warm and dry. No rash noted.   Cardiovascular: Normal heart rate noted  Respiratory: Normal respiratory effort, no problems with respiration noted  Abdomen: Soft, gravid, appropriate for gestational age.        Pelvic:  SSE peformed -- no pooling of fluid. Moderate amount of green clumpy discharge. No blood. Cervix visually closed.   Extremities: Normal range of motion.     Mental Status: Normal mood and affect. Normal  behavior. Normal judgment and thought content.   Assessment and Plan:  Pregnancy: G3P1011 at 105w1d  1. Supervision of high risk pregnancy, antepartum -pt has next ob visit scheduled 11/20  2. Chronic hypertension during pregnancy, antepartum -reviewed BPs with Dr. Kennon Rounds. C/w previous BPs. Pt asymptomatic.  -Had BPP on 11/11 of 8/8 but no showed her NST visit & doesn't have f/u testing scheduled. Will schedule for BPP/NST on Monday  3. Vaginal discharge during pregnancy in third trimester  - Cervicovaginal ancillary only  4. Vaginal yeast infection  - terconazole (TERAZOL 3) 0.8 % vaginal cream; Place 1 applicator vaginally at bedtime.  Dispense: 20 g; Refill: 0  Term labor symptoms and general obstetric precautions including but not limited to vaginal bleeding, contractions, leaking of fluid and fetal movement were reviewed in detail with the patient. Please refer to After Visit Summary for other counseling recommendations.  Return in about 3 days (around 06/21/2018) for BPP/NST.  Future Appointments  Date Time Provider [place]  06/23/2018 10:15 AM WOC-WOCA NST WOC-WOCA WOC  06/23/2018 11:15 AM Donnamae Jude, MD WOC-WOCA WOC  06/28/2018  9:15 AM WOC-WOCA NST WOC-WOCA WOC  06/28/2018 10:15 AM Woodroe Mode, MD WOC-WOCA WOC  07/05/2018 10:15 AM WOC-WOCA NST WOC-WOCA WOC  07/05/2018 11:15 AM Woodroe Mode, MD [facility] WOC    Jorje Guild, NP

## 2018-06-21 ENCOUNTER — Other Ambulatory Visit: Payer: Medicaid Other

## 2018-06-21 ENCOUNTER — Encounter: Payer: Medicaid Other | Admitting: Obstetrics & Gynecology

## 2018-06-21 LAB — CERVICOVAGINAL ANCILLARY ONLY
Bacterial vaginitis: POSITIVE — AB
CANDIDA VAGINITIS: POSITIVE — AB
CHLAMYDIA, DNA PROBE: NEGATIVE
NEISSERIA GONORRHEA: NEGATIVE
Trichomonas: NEGATIVE

## 2018-06-23 ENCOUNTER — Ambulatory Visit (INDEPENDENT_AMBULATORY_CARE_PROVIDER_SITE_OTHER): Payer: Medicaid Other | Admitting: Family Medicine

## 2018-06-23 ENCOUNTER — Ambulatory Visit: Payer: Self-pay

## 2018-06-23 ENCOUNTER — Ambulatory Visit (INDEPENDENT_AMBULATORY_CARE_PROVIDER_SITE_OTHER): Payer: Medicaid Other | Admitting: *Deleted

## 2018-06-23 ENCOUNTER — Telehealth (HOSPITAL_COMMUNITY): Payer: Self-pay | Admitting: *Deleted

## 2018-06-23 ENCOUNTER — Encounter (HOSPITAL_COMMUNITY): Payer: Self-pay

## 2018-06-23 VITALS — BP 124/84 | HR 94 | Wt 257.2 lb

## 2018-06-23 DIAGNOSIS — O10913 Unspecified pre-existing hypertension complicating pregnancy, third trimester: Secondary | ICD-10-CM

## 2018-06-23 DIAGNOSIS — O10919 Unspecified pre-existing hypertension complicating pregnancy, unspecified trimester: Secondary | ICD-10-CM

## 2018-06-23 DIAGNOSIS — O99280 Endocrine, nutritional and metabolic diseases complicating pregnancy, unspecified trimester: Secondary | ICD-10-CM

## 2018-06-23 DIAGNOSIS — O99283 Endocrine, nutritional and metabolic diseases complicating pregnancy, third trimester: Secondary | ICD-10-CM

## 2018-06-23 DIAGNOSIS — O0990 Supervision of high risk pregnancy, unspecified, unspecified trimester: Secondary | ICD-10-CM

## 2018-06-23 DIAGNOSIS — Z98891 History of uterine scar from previous surgery: Secondary | ICD-10-CM

## 2018-06-23 DIAGNOSIS — Z3A36 36 weeks gestation of pregnancy: Secondary | ICD-10-CM

## 2018-06-23 DIAGNOSIS — E039 Hypothyroidism, unspecified: Secondary | ICD-10-CM

## 2018-06-23 NOTE — Telephone Encounter (Signed)
Preadmission screen  

## 2018-06-23 NOTE — Progress Notes (Signed)

## 2018-06-23 NOTE — Patient Instructions (Signed)

## 2018-06-23 NOTE — Progress Notes (Signed)
    PRENATAL VISIT NOTE  Subjective:  [name] is a 32 y.o. G3P1011 at 108w6d being seen today for ongoing prenatal care.  She is currently monitored for the following issues for this high-risk pregnancy and has Hypertension; Postoperative hypothyroidism; Thyroid cancer ([place]); Hematuria; Supervision of high risk pregnancy, antepartum; Chronic hypertension in pregnancy; History of cesarean delivery; Status post complete thyroidectomy; Obesity, Class III, BMI 40-49.9 (morbid obesity) ([place]); Obesity affecting pregnancy, antepartum; Hypothyroidism during pregnancy, antepartum; RUQ pain; Other headache syndrome; Substance abuse affecting pregnancy in third trimester, antepartum; and Chronic hypertension during pregnancy, antepartum on their problem list.  Patient reports no complaints.  Contractions: Irregular. Vag. Bleeding: None.  Movement: Present. Denies leaking of fluid.   The following portions of the patient's history were reviewed and updated as appropriate: allergies, current medications, past family history, past medical history, past social history, past surgical history and problem list. Problem list updated.  Objective:   Vitals:   06/23/18 1049  BP: 124/84  Pulse: 94  Weight: 257 lb 3.2 oz (116.7 kg)    Fetal Status: Fetal Heart Rate (bpm): NST   Movement: Present     General:  Alert, oriented and cooperative. Patient is in no acute distress.  Skin: Skin is warm and dry. No rash noted.   Cardiovascular: Normal heart rate noted  Respiratory: Normal respiratory effort, no problems with respiration noted  Abdomen: Soft, gravid, appropriate for gestational age.  Pain/Pressure: Present     Pelvic: Cervical exam deferred        Extremities: Normal range of motion.  Edema: Trace  Mental Status: Normal mood and affect. Normal behavior. Normal judgment and thought content.  NST: Baseline: 135 bpm, Variability: Good {> 6 bpm), Accelerations: Reactive and Decelerations:  Absent Assessment and Plan:  Pregnancy: G3P1011 at [redacted]w[redacted]d  1. Supervision of high risk pregnancy, antepartum GBS today - Strep Gp B NAA  2. Chronic hypertension during pregnancy, antepartum BP is well controlled on meds. Continue Labetalol and Procardia  3. Hypothyroidism during pregnancy, antepartum Continue Synthroid  4. History of cesarean delivery For RLTCS--scheduled  Preterm labor symptoms and general obstetric precautions including but not limited to vaginal bleeding, contractions, leaking of fluid and fetal movement were reviewed in detail with the patient. Please refer to After Visit Summary for other counseling recommendations.  Return in about 5 days (around 06/28/2018) for as scheduled.  Future Appointments  Date Time Provider [place]  06/28/2018  9:15 AM WOC-WOCA NST WOC-WOCA WOC  06/28/2018 10:15 AM Woodroe Mode, MD [facility] [place]  07/05/2018 10:15 AM WOC-WOCA NST WOC-WOCA WOC  07/05/2018 11:15 AM Woodroe Mode, MD WOC-WOCA WOC  07/07/2018  9:15 AM WH-SDCW PAT 5 WH-SDCW None    Donnamae Jude, MD

## 2018-06-23 NOTE — Progress Notes (Signed)
[place] for growth done 11/11.  Rpt C/S scheduled on 12/5.  Pt states she wakes up with H/A every morning.  Tylenol relieves the pain. She denies visual disturbances or RUQ pain.

## 2018-06-25 LAB — STREP GP B NAA: STREP GROUP B AG: POSITIVE — AB

## 2018-06-28 ENCOUNTER — Encounter: Payer: Self-pay | Admitting: Obstetrics & Gynecology

## 2018-06-28 ENCOUNTER — Encounter: Payer: Self-pay | Admitting: Family Medicine

## 2018-06-28 ENCOUNTER — Other Ambulatory Visit: Payer: Medicaid Other

## 2018-06-28 ENCOUNTER — Encounter: Payer: Medicaid Other | Admitting: Obstetrics & Gynecology

## 2018-06-28 DIAGNOSIS — O99820 Streptococcus B carrier state complicating pregnancy: Secondary | ICD-10-CM | POA: Insufficient documentation

## 2018-06-30 ENCOUNTER — Ambulatory Visit: Payer: Medicaid Other

## 2018-07-01 ENCOUNTER — Inpatient Hospital Stay (HOSPITAL_COMMUNITY): Payer: Medicaid Other | Admitting: Anesthesiology

## 2018-07-01 ENCOUNTER — Inpatient Hospital Stay (HOSPITAL_COMMUNITY)
Admission: AD | Admit: 2018-07-01 | Discharge: 2018-07-05 | DRG: 787 | Disposition: A | Discharge status: Home / Self Care | Payer: Medicaid Other | Attending: Obstetrics and Gynecology | Admitting: Obstetrics and Gynecology

## 2018-07-01 ENCOUNTER — Encounter (HOSPITAL_COMMUNITY)
Admission: AD | Disposition: A | Discharge status: Home / Self Care | Payer: Self-pay | Source: Home / Self Care | Attending: Obstetrics and Gynecology

## 2018-07-01 ENCOUNTER — Encounter (HOSPITAL_COMMUNITY): Payer: Self-pay | Admitting: *Deleted

## 2018-07-01 DIAGNOSIS — O34211 Maternal care for low transverse scar from previous cesarean delivery: Secondary | ICD-10-CM | POA: Diagnosis present

## 2018-07-01 DIAGNOSIS — O9882 Other maternal infectious and parasitic diseases complicating childbirth: Secondary | ICD-10-CM | POA: Diagnosis present

## 2018-07-01 DIAGNOSIS — B373 Candidiasis of vulva and vagina: Secondary | ICD-10-CM | POA: Diagnosis present

## 2018-07-01 DIAGNOSIS — O114 Pre-existing hypertension with pre-eclampsia, complicating childbirth: Principal | ICD-10-CM | POA: Diagnosis present

## 2018-07-01 DIAGNOSIS — O99824 Streptococcus B carrier state complicating childbirth: Secondary | ICD-10-CM | POA: Diagnosis present

## 2018-07-01 DIAGNOSIS — O99214 Obesity complicating childbirth: Secondary | ICD-10-CM | POA: Diagnosis present

## 2018-07-01 DIAGNOSIS — Z3A38 38 weeks gestation of pregnancy: Secondary | ICD-10-CM

## 2018-07-01 DIAGNOSIS — O1092 Unspecified pre-existing hypertension complicating childbirth: Secondary | ICD-10-CM

## 2018-07-01 DIAGNOSIS — E039 Hypothyroidism, unspecified: Secondary | ICD-10-CM | POA: Diagnosis present

## 2018-07-01 DIAGNOSIS — Z98891 History of uterine scar from previous surgery: Secondary | ICD-10-CM

## 2018-07-01 DIAGNOSIS — O99284 Endocrine, nutritional and metabolic diseases complicating childbirth: Secondary | ICD-10-CM | POA: Diagnosis present

## 2018-07-01 DIAGNOSIS — O10919 Unspecified pre-existing hypertension complicating pregnancy, unspecified trimester: Secondary | ICD-10-CM | POA: Diagnosis present

## 2018-07-01 DIAGNOSIS — O1002 Pre-existing essential hypertension complicating childbirth: Secondary | ICD-10-CM | POA: Diagnosis present

## 2018-07-01 DIAGNOSIS — L293 Anogenital pruritus, unspecified: Secondary | ICD-10-CM | POA: Diagnosis present

## 2018-07-01 DIAGNOSIS — O1414 Severe pre-eclampsia complicating childbirth: Secondary | ICD-10-CM | POA: Diagnosis not present

## 2018-07-01 HISTORY — DX: Pre-existing hypertension with pre-eclampsia, complicating childbirth: O11.4

## 2018-07-01 HISTORY — DX: Unspecified pre-existing hypertension complicating childbirth: O10.92

## 2018-07-01 LAB — URINALYSIS, ROUTINE W REFLEX MICROSCOPIC
BILIRUBIN URINE: NEGATIVE
Glucose, UA: NEGATIVE mg/dL
KETONES UR: NEGATIVE mg/dL
Nitrite: NEGATIVE
PH: 5 (ref 5.0–8.0)
PROTEIN: NEGATIVE mg/dL
Specific Gravity, Urine: 1.015 (ref 1.005–1.030)

## 2018-07-01 LAB — CBC
HCT: 33.3 % — ABNORMAL LOW (ref 36.0–46.0)
Hemoglobin: 11.4 g/dL — ABNORMAL LOW (ref 12.0–15.0)
MCH: 31.9 pg (ref 26.0–34.0)
MCHC: 34.2 g/dL (ref 30.0–36.0)
MCV: 93.3 fL (ref 80.0–100.0)
Platelets: 242 10*3/uL (ref 150–400)
RBC: 3.57 MIL/uL — AB (ref 3.87–5.11)
RDW: 13.3 % (ref 11.5–15.5)
WBC: 7.8 10*3/uL (ref 4.0–10.5)
nRBC: 0 % (ref 0.0–0.2)

## 2018-07-01 LAB — COMPREHENSIVE METABOLIC PANEL
ALBUMIN: 3.2 g/dL — AB (ref 3.5–5.0)
ALT: 16 U/L (ref 0–44)
AST: 23 U/L (ref 15–41)
Alkaline Phosphatase: 88 U/L (ref 38–126)
Anion gap: 8 (ref 5–15)
BUN: 9 mg/dL (ref 6–20)
CHLORIDE: 109 mmol/L (ref 98–111)
CO2: 19 mmol/L — AB (ref 22–32)
CREATININE: 0.63 mg/dL (ref 0.44–1.00)
Calcium: 8.6 mg/dL — ABNORMAL LOW (ref 8.9–10.3)
GFR calc Af Amer: >60 mL/min (ref >60)
GLUCOSE: 97 mg/dL (ref 70–99)
POTASSIUM: 3.4 mmol/L — AB (ref 3.5–5.1)
SODIUM: 136 mmol/L (ref 135–145)
Total Bilirubin: 0.8 mg/dL (ref 0.3–1.2)
Total Protein: 6.5 g/dL (ref 6.5–8.1)

## 2018-07-01 LAB — TYPE AND SCREEN
ABO/RH(D): O POS
ANTIBODY SCREEN: NEGATIVE

## 2018-07-01 LAB — PROTEIN / CREATININE RATIO, URINE
CREATININE, URINE: 137 mg/dL
Protein Creatinine Ratio: 0.23 mg/mg{Cre} — ABNORMAL HIGH (ref 0.00–0.15)
Total Protein, Urine: 31 mg/dL

## 2018-07-01 SURGERY — Surgical Case
Anesthesia: Spinal | Site: Abdomen | Wound class: Clean Contaminated

## 2018-07-01 MED ORDER — MORPHINE SULFATE (PF) 0.5 MG/ML IJ SOLN
INTRAMUSCULAR | Status: DC | PRN
  Administered 2018-07-01: 150 ug via INTRATHECAL

## 2018-07-01 MED ORDER — OXYTOCIN 40 UNITS IN LACTATED RINGERS INFUSION - SIMPLE MED
2.5000 [IU]/h | INTRAVENOUS | Status: AC
  Administered 2018-07-01 – 2018-07-02 (×2): 2.5 [IU]/h via INTRAVENOUS
  Filled 2018-07-01: qty 1000

## 2018-07-01 MED ORDER — LABETALOL HCL 200 MG PO TABS
200.0000 mg | ORAL_TABLET | Freq: Two times a day (BID) | ORAL | Status: DC
  Administered 2018-07-01 – 2018-07-02 (×3): 200 mg via ORAL
  Filled 2018-07-01 (×3): qty 1

## 2018-07-01 MED ORDER — NALBUPHINE HCL 10 MG/ML IJ SOLN: 5.0000 mg | INTRAMUSCULAR | Status: DC | PRN

## 2018-07-01 MED ORDER — MAGNESIUM SULFATE 40 G IN LACTATED RINGERS - SIMPLE
2.0000 g/h | INTRAVENOUS | Status: DC
  Administered 2018-07-02: 2 g/h via INTRAVENOUS
  Filled 2018-07-01 (×2): qty 500

## 2018-07-01 MED ORDER — PHENYLEPHRINE 40 MCG/ML (10ML) SYRINGE FOR IV PUSH (FOR BLOOD PRESSURE SUPPORT)
PREFILLED_SYRINGE | INTRAVENOUS | Status: AC
  Filled 2018-07-01: qty 10

## 2018-07-01 MED ORDER — SCOPOLAMINE 1 MG/3DAYS TD PT72
MEDICATED_PATCH | TRANSDERMAL | Status: DC | PRN
  Administered 2018-07-01: 1 via TRANSDERMAL

## 2018-07-01 MED ORDER — NIFEDIPINE ER OSMOTIC RELEASE 30 MG PO TB24
60.0000 mg | ORAL_TABLET | Freq: Once | ORAL | Status: AC
  Administered 2018-07-01: 60 mg via ORAL
  Filled 2018-07-01: qty 2

## 2018-07-01 MED ORDER — CEFAZOLIN SODIUM-DEXTROSE 2-4 GM/100ML-% IV SOLN
2.0000 g | INTRAVENOUS | Status: AC
  Administered 2018-07-01: 2 g via INTRAVENOUS
  Filled 2018-07-01: qty 100

## 2018-07-01 MED ORDER — LACTATED RINGERS IV SOLN
INTRAVENOUS | Status: DC | PRN
  Administered 2018-07-01: 17:00:00 via INTRAVENOUS

## 2018-07-01 MED ORDER — LACTATED RINGERS IV SOLN
INTRAVENOUS | Status: DC
  Administered 2018-07-01: 14:00:00 via INTRAVENOUS

## 2018-07-01 MED ORDER — BUPIVACAINE IN DEXTROSE 0.75-8.25 % IT SOLN
INTRATHECAL | Status: DC | PRN
  Administered 2018-07-01: 1.6 mL via INTRATHECAL

## 2018-07-01 MED ORDER — OXYTOCIN 10 UNIT/ML IJ SOLN
INTRAVENOUS | Status: DC | PRN
  Administered 2018-07-01: 40 [IU] via INTRAVENOUS

## 2018-07-01 MED ORDER — SCOPOLAMINE 1 MG/3DAYS TD PT72
1.0000 | MEDICATED_PATCH | Freq: Once | TRANSDERMAL | Status: DC
  Filled 2018-07-01: qty 1

## 2018-07-01 MED ORDER — NALBUPHINE HCL 10 MG/ML IJ SOLN: 5.0000 mg | Freq: Once | INTRAMUSCULAR | Status: DC | PRN

## 2018-07-01 MED ORDER — LABETALOL HCL 5 MG/ML IV SOLN: 20.0000 mg | INTRAVENOUS | Status: DC | PRN

## 2018-07-01 MED ORDER — OXYCODONE HCL 5 MG PO TABS: 5.0000 mg | ORAL_TABLET | Freq: Once | ORAL | Status: DC | PRN

## 2018-07-01 MED ORDER — WITCH HAZEL-GLYCERIN EX PADS: 1.0000 "application " | MEDICATED_PAD | CUTANEOUS | Status: DC | PRN

## 2018-07-01 MED ORDER — ONDANSETRON HCL 4 MG/2ML IJ SOLN
INTRAMUSCULAR | Status: AC
  Filled 2018-07-01: qty 2

## 2018-07-01 MED ORDER — ONDANSETRON HCL 4 MG/2ML IJ SOLN
INTRAMUSCULAR | Status: DC | PRN
  Administered 2018-07-01: 4 mg via INTRAVENOUS

## 2018-07-01 MED ORDER — NALOXONE HCL 0.4 MG/ML IJ SOLN: 0.4000 mg | INTRAMUSCULAR | Status: DC | PRN

## 2018-07-01 MED ORDER — LACTATED RINGERS IV SOLN: INTRAVENOUS | Status: DC

## 2018-07-01 MED ORDER — METRONIDAZOLE 500 MG PO TABS
500.0000 mg | ORAL_TABLET | Freq: Two times a day (BID) | ORAL | Status: DC
  Administered 2018-07-01 – 2018-07-04 (×7): 500 mg via ORAL
  Filled 2018-07-01 (×7): qty 1

## 2018-07-01 MED ORDER — FENTANYL CITRATE (PF) 100 MCG/2ML IJ SOLN
INTRAMUSCULAR | Status: DC | PRN
  Administered 2018-07-01: 25 ug via INTRAVENOUS
  Administered 2018-07-01: 15 ug via INTRAVENOUS
  Administered 2018-07-01: 35 ug via INTRAVENOUS

## 2018-07-01 MED ORDER — FLUCONAZOLE 150 MG PO TABS
150.0000 mg | ORAL_TABLET | Freq: Once | ORAL | Status: AC
  Administered 2018-07-01: 150 mg via ORAL
  Filled 2018-07-01: qty 1

## 2018-07-01 MED ORDER — DEXAMETHASONE SODIUM PHOSPHATE 4 MG/ML IJ SOLN
INTRAMUSCULAR | Status: DC | PRN
  Administered 2018-07-01: 4 mg via INTRAVENOUS

## 2018-07-01 MED ORDER — LABETALOL HCL 5 MG/ML IV SOLN: 40.0000 mg | INTRAVENOUS | Status: DC | PRN

## 2018-07-01 MED ORDER — SODIUM CHLORIDE 0.9% FLUSH
3.0000 mL | INTRAVENOUS | Status: DC | PRN
  Administered 2018-07-03: 3 mL via INTRAVENOUS
  Filled 2018-07-01: qty 3

## 2018-07-01 MED ORDER — ONDANSETRON HCL 4 MG/2ML IJ SOLN: 4.0000 mg | Freq: Once | INTRAMUSCULAR | Status: DC | PRN

## 2018-07-01 MED ORDER — ENOXAPARIN SODIUM 60 MG/0.6ML ~~LOC~~ SOLN
60.0000 mg | SUBCUTANEOUS | Status: DC
  Administered 2018-07-02 – 2018-07-04 (×3): 60 mg via SUBCUTANEOUS
  Filled 2018-07-01 (×4): qty 0.6

## 2018-07-01 MED ORDER — PHENYLEPHRINE 8 MG IN D5W 100 ML (0.08MG/ML) PREMIX OPTIME
INJECTION | INTRAVENOUS | Status: DC | PRN
  Administered 2018-07-01: 40 ug/min via INTRAVENOUS

## 2018-07-01 MED ORDER — LABETALOL HCL 100 MG PO TABS
200.0000 mg | ORAL_TABLET | Freq: Once | ORAL | Status: AC
  Administered 2018-07-01: 200 mg via ORAL
  Filled 2018-07-01: qty 2

## 2018-07-01 MED ORDER — DEXAMETHASONE SODIUM PHOSPHATE 4 MG/ML IJ SOLN
INTRAMUSCULAR | Status: AC
  Filled 2018-07-01: qty 1

## 2018-07-01 MED ORDER — HYDRALAZINE HCL 20 MG/ML IJ SOLN: 5.0000 mg | INTRAMUSCULAR | Status: DC | PRN

## 2018-07-01 MED ORDER — NALOXONE HCL 4 MG/10ML IJ SOLN: 1.0000 ug/kg/h | INTRAVENOUS | Status: DC | PRN

## 2018-07-01 MED ORDER — SIMETHICONE 80 MG PO CHEW
80.0000 mg | CHEWABLE_TABLET | Freq: Three times a day (TID) | ORAL | Status: DC
  Administered 2018-07-02 – 2018-07-04 (×8): 80 mg via ORAL
  Filled 2018-07-01 (×8): qty 1

## 2018-07-01 MED ORDER — ACETAMINOPHEN 325 MG PO TABS
650.0000 mg | ORAL_TABLET | ORAL | Status: DC | PRN
  Administered 2018-07-05: 650 mg via ORAL
  Filled 2018-07-01: qty 2

## 2018-07-01 MED ORDER — IBUPROFEN 600 MG PO TABS
600.0000 mg | ORAL_TABLET | Freq: Four times a day (QID) | ORAL | Status: DC
  Administered 2018-07-01 – 2018-07-05 (×15): 600 mg via ORAL
  Filled 2018-07-01 (×15): qty 1

## 2018-07-01 MED ORDER — SOD CITRATE-CITRIC ACID 500-334 MG/5ML PO SOLN: 30.0000 mL | ORAL | Status: DC

## 2018-07-01 MED ORDER — TETANUS-DIPHTH-ACELL PERTUSSIS 5-2.5-18.5 LF-MCG/0.5 IM SUSP: 0.5000 mL | Freq: Once | INTRAMUSCULAR | Status: DC

## 2018-07-01 MED ORDER — LACTATED RINGERS IV SOLN
INTRAVENOUS | Status: DC | PRN
  Administered 2018-07-01: 16:00:00 via INTRAVENOUS

## 2018-07-01 MED ORDER — FENTANYL CITRATE (PF) 100 MCG/2ML IJ SOLN
INTRAMUSCULAR | Status: AC
  Filled 2018-07-01: qty 2

## 2018-07-01 MED ORDER — MIDAZOLAM HCL 2 MG/2ML IJ SOLN
INTRAMUSCULAR | Status: DC | PRN
  Administered 2018-07-01 (×2): 1 mg via INTRAVENOUS

## 2018-07-01 MED ORDER — PHENYLEPHRINE HCL 10 MG/ML IJ SOLN
INTRAMUSCULAR | Status: DC | PRN
  Administered 2018-07-01: 120 ug via INTRAVENOUS

## 2018-07-01 MED ORDER — MIDAZOLAM HCL 2 MG/2ML IJ SOLN
INTRAMUSCULAR | Status: AC
  Filled 2018-07-01: qty 2

## 2018-07-01 MED ORDER — DIBUCAINE 1 % RE OINT: 1.0000 "application " | TOPICAL_OINTMENT | RECTAL | Status: DC | PRN

## 2018-07-01 MED ORDER — HYDRALAZINE HCL 20 MG/ML IJ SOLN: 10.0000 mg | INTRAMUSCULAR | Status: DC | PRN

## 2018-07-01 MED ORDER — SODIUM CHLORIDE 0.9 % IR SOLN
Status: DC | PRN
  Administered 2018-07-01: 1

## 2018-07-01 MED ORDER — SENNOSIDES-DOCUSATE SODIUM 8.6-50 MG PO TABS
2.0000 | ORAL_TABLET | ORAL | Status: DC
  Administered 2018-07-01 – 2018-07-04 (×4): 2 via ORAL
  Filled 2018-07-01 (×4): qty 2

## 2018-07-01 MED ORDER — SCOPOLAMINE 1 MG/3DAYS TD PT72
MEDICATED_PATCH | TRANSDERMAL | Status: AC
  Filled 2018-07-01: qty 1

## 2018-07-01 MED ORDER — FENTANYL CITRATE (PF) 100 MCG/2ML IJ SOLN
25.0000 ug | INTRAMUSCULAR | Status: DC | PRN
  Administered 2018-07-01: 50 ug via INTRAVENOUS
  Administered 2018-07-01: 25 ug via INTRAVENOUS

## 2018-07-01 MED ORDER — MEPERIDINE HCL 25 MG/ML IJ SOLN: 6.2500 mg | INTRAMUSCULAR | Status: DC | PRN

## 2018-07-01 MED ORDER — BUPIVACAINE HCL (PF) 0.5 % IJ SOLN
INTRAMUSCULAR | Status: DC | PRN
  Administered 2018-07-01: 25 mL

## 2018-07-01 MED ORDER — ZOLPIDEM TARTRATE 5 MG PO TABS: 5.0000 mg | ORAL_TABLET | Freq: Every evening | ORAL | Status: DC | PRN

## 2018-07-01 MED ORDER — PRENATAL MULTIVITAMIN CH
1.0000 | ORAL_TABLET | Freq: Every day | ORAL | Status: DC
  Filled 2018-07-01 (×4): qty 1

## 2018-07-01 MED ORDER — FAMOTIDINE IN NACL 20-0.9 MG/50ML-% IV SOLN
20.0000 mg | Freq: Once | INTRAVENOUS | Status: AC
  Administered 2018-07-01: 20 mg via INTRAVENOUS
  Filled 2018-07-01: qty 50

## 2018-07-01 MED ORDER — COCONUT OIL OIL: 1.0000 "application " | TOPICAL_OIL | Status: DC | PRN

## 2018-07-01 MED ORDER — OXYCODONE HCL 5 MG/5ML PO SOLN: 5.0000 mg | Freq: Once | ORAL | Status: DC | PRN

## 2018-07-01 MED ORDER — METOCLOPRAMIDE HCL 5 MG/ML IJ SOLN
INTRAMUSCULAR | Status: AC
  Filled 2018-07-01: qty 2

## 2018-07-01 MED ORDER — PHENYLEPHRINE 8 MG IN D5W 100 ML (0.08MG/ML) PREMIX OPTIME
INJECTION | INTRAVENOUS | Status: AC
  Filled 2018-07-01: qty 100

## 2018-07-01 MED ORDER — FENTANYL CITRATE (PF) 100 MCG/2ML IJ SOLN
INTRAMUSCULAR | Status: AC
  Administered 2018-07-01: 50 ug via INTRAVENOUS
  Filled 2018-07-01: qty 2

## 2018-07-01 MED ORDER — MENTHOL 3 MG MT LOZG: 1.0000 | LOZENGE | OROMUCOSAL | Status: DC | PRN

## 2018-07-01 MED ORDER — MAGNESIUM SULFATE BOLUS VIA INFUSION
4.0000 g | Freq: Once | INTRAVENOUS | Status: AC
  Administered 2018-07-01: 4 g via INTRAVENOUS
  Filled 2018-07-01: qty 500

## 2018-07-01 MED ORDER — DIPHENHYDRAMINE HCL 25 MG PO CAPS: 25.0000 mg | ORAL_CAPSULE | Freq: Four times a day (QID) | ORAL | Status: DC | PRN

## 2018-07-01 MED ORDER — BUPIVACAINE HCL (PF) 0.5 % IJ SOLN
INTRAMUSCULAR | Status: AC
  Filled 2018-07-01: qty 30

## 2018-07-01 MED ORDER — FENTANYL CITRATE (PF) 100 MCG/2ML IJ SOLN: INTRAMUSCULAR | Status: DC | PRN

## 2018-07-01 MED ORDER — DIPHENHYDRAMINE HCL 25 MG PO CAPS
25.0000 mg | ORAL_CAPSULE | ORAL | Status: DC | PRN
  Filled 2018-07-01: qty 1

## 2018-07-01 MED ORDER — OXYCODONE HCL 5 MG PO TABS
5.0000 mg | ORAL_TABLET | ORAL | Status: DC | PRN
  Administered 2018-07-01 – 2018-07-02 (×4): 5 mg via ORAL
  Filled 2018-07-01 (×4): qty 1

## 2018-07-01 MED ORDER — KETAMINE HCL 10 MG/ML IJ SOLN
INTRAMUSCULAR | Status: AC
  Filled 2018-07-01: qty 1

## 2018-07-01 MED ORDER — OXYTOCIN 10 UNIT/ML IJ SOLN
INTRAMUSCULAR | Status: AC
  Filled 2018-07-01: qty 4

## 2018-07-01 MED ORDER — METOCLOPRAMIDE HCL 5 MG/ML IJ SOLN
INTRAMUSCULAR | Status: DC | PRN
  Administered 2018-07-01: 10 mg via INTRAVENOUS

## 2018-07-01 MED ORDER — LACTATED RINGERS IV SOLN
INTRAVENOUS | Status: DC
  Administered 2018-07-01: via INTRAVENOUS

## 2018-07-01 MED ORDER — SIMETHICONE 80 MG PO CHEW
80.0000 mg | CHEWABLE_TABLET | ORAL | Status: DC
  Administered 2018-07-01 – 2018-07-04 (×4): 80 mg via ORAL
  Filled 2018-07-01 (×4): qty 1

## 2018-07-01 MED ORDER — SOD CITRATE-CITRIC ACID 500-334 MG/5ML PO SOLN
30.0000 mL | Freq: Once | ORAL | Status: AC
  Administered 2018-07-01: 30 mL via ORAL
  Filled 2018-07-01: qty 15

## 2018-07-01 MED ORDER — ONDANSETRON HCL 4 MG/2ML IJ SOLN: 4.0000 mg | Freq: Three times a day (TID) | INTRAMUSCULAR | Status: DC | PRN

## 2018-07-01 MED ORDER — LEVOTHYROXINE SODIUM 25 MCG PO TABS
225.0000 ug | ORAL_TABLET | Freq: Every day | ORAL | Status: DC
  Administered 2018-07-02 – 2018-07-04 (×3): 225 ug via ORAL
  Filled 2018-07-01 (×4): qty 1

## 2018-07-01 MED ORDER — DIPHENHYDRAMINE HCL 50 MG/ML IJ SOLN: 12.5000 mg | INTRAMUSCULAR | Status: DC | PRN

## 2018-07-01 MED ORDER — SIMETHICONE 80 MG PO CHEW: 80.0000 mg | CHEWABLE_TABLET | ORAL | Status: DC | PRN

## 2018-07-01 MED ORDER — OXYCODONE HCL 5 MG PO TABS
10.0000 mg | ORAL_TABLET | ORAL | Status: DC | PRN
  Administered 2018-07-03 – 2018-07-05 (×9): 10 mg via ORAL
  Filled 2018-07-01 (×9): qty 2

## 2018-07-01 MED ORDER — MORPHINE SULFATE (PF) 0.5 MG/ML IJ SOLN
INTRAMUSCULAR | Status: AC
  Filled 2018-07-01: qty 10

## 2018-07-01 SURGICAL SUPPLY — 48 items
APL SKNCLS STERI-STRIP NONHPOA (GAUZE/BANDAGES/DRESSINGS) ×1
BENZOIN TINCTURE PRP APPL 2/3 (GAUZE/BANDAGES/DRESSINGS) ×3 IMPLANT
BLADE TIP J-PLASMA PRECISE LAP (MISCELLANEOUS) ×3 IMPLANT
CHLORAPREP W/TINT 26ML (MISCELLANEOUS) ×3 IMPLANT
CLAMP CORD UMBIL (MISCELLANEOUS) IMPLANT
CLOSURE WOUND 1/2 X4 (GAUZE/BANDAGES/DRESSINGS) ×1
CLOTH BEACON ORANGE TIMEOUT ST (SAFETY) ×3 IMPLANT
DRESSING PREVENA PLUS CUSTOM (GAUZE/BANDAGES/DRESSINGS) IMPLANT
DRSG OPSITE POSTOP 4X10 (GAUZE/BANDAGES/DRESSINGS) ×3 IMPLANT
DRSG PREVENA PLUS CUSTOM (GAUZE/BANDAGES/DRESSINGS) ×6
ELECT REM PT RETURN 9FT ADLT (ELECTROSURGICAL) ×3
ELECTRODE REM PT RTRN 9FT ADLT (ELECTROSURGICAL) ×1 IMPLANT
EXTRACTOR VACUUM KIWI (MISCELLANEOUS) IMPLANT
GAUZE SPONGE 4X4 12PLY STRL LF (GAUZE/BANDAGES/DRESSINGS) ×4 IMPLANT
GLOVE BIO SURGEON STRL SZ7 (GLOVE) ×3 IMPLANT
GLOVE BIOGEL PI IND STRL 7.0 (GLOVE) ×2 IMPLANT
GLOVE BIOGEL PI INDICATOR 7.0 (GLOVE) ×4
GOWN STRL REUS W/TWL LRG LVL3 (GOWN DISPOSABLE) ×6 IMPLANT
GOWN STRL REUS W/TWL XL LVL3 (GOWN DISPOSABLE) ×3 IMPLANT
HEMOSTAT ARISTA ABSORB 3G PWDR (MISCELLANEOUS) ×2 IMPLANT
HEMOSTAT SURGICEL 2X3 (HEMOSTASIS) ×2 IMPLANT
HOVERMATT SINGLE USE (MISCELLANEOUS) ×2 IMPLANT
KIT ABG SYR 3ML LUER SLIP (SYRINGE) IMPLANT
NDL HYPO 25X5/8 SAFETYGLIDE (NEEDLE) IMPLANT
NEEDLE HYPO 22GX1.5 SAFETY (NEEDLE) ×3 IMPLANT
NEEDLE HYPO 25X5/8 SAFETYGLIDE (NEEDLE) IMPLANT
NS IRRIG 1000ML POUR BTL (IV SOLUTION) ×3 IMPLANT
PACK C SECTION WH (CUSTOM PROCEDURE TRAY) ×3 IMPLANT
PAD ABD 7.5X8 STRL (GAUZE/BANDAGES/DRESSINGS) ×2 IMPLANT
PAD OB MATERNITY 4.3X12.25 (PERSONAL CARE ITEMS) ×3 IMPLANT
PENCIL SMOKE EVAC W/HOLSTER (ELECTROSURGICAL) ×3 IMPLANT
RETRACTOR TRAXI PANNICULUS (MISCELLANEOUS) IMPLANT
RTRCTR C-SECT PINK 25CM LRG (MISCELLANEOUS) IMPLANT
SPONGE LAP 18X18 RF (DISPOSABLE) ×9 IMPLANT
SPONGE SURGIFOAM ABS GEL 12-7 (HEMOSTASIS) IMPLANT
STRIP CLOSURE SKIN 1/2X4 (GAUZE/BANDAGES/DRESSINGS) ×2 IMPLANT
SUT PDS AB 0 CTX 60 (SUTURE) ×2 IMPLANT
SUT PLAIN 0 NONE (SUTURE) IMPLANT
SUT SILK 0 TIES 10X30 (SUTURE) IMPLANT
SUT VIC AB 0 CT1 36 (SUTURE) ×9 IMPLANT
SUT VIC AB 2-0 CT1 (SUTURE) ×4 IMPLANT
SUT VIC AB 3-0 CT1 27 (SUTURE) ×3
SUT VIC AB 3-0 CT1 TAPERPNT 27 (SUTURE) ×1 IMPLANT
SUT VIC AB 4-0 KS 27 (SUTURE) IMPLANT
SYR CONTROL 10ML LL (SYRINGE) ×3 IMPLANT
TOWEL OR 17X24 6PK STRL BLUE (TOWEL DISPOSABLE) ×3 IMPLANT
TRAXI PANNICULUS RETRACTOR (MISCELLANEOUS) ×2
TRAY FOLEY W/BAG SLVR 14FR LF (SET/KITS/TRAYS/PACK) ×3 IMPLANT

## 2018-07-01 NOTE — Anesthesia Postprocedure Evaluation (Signed)
Anesthesia Post Note  Patient: [name]  Procedure(s) Performed: CESAREAN SECTION (N/A Abdomen)     Patient location during evaluation: PACU Anesthesia Type: Spinal Level of consciousness: oriented and awake and alert Pain management: pain level controlled Vital Signs Assessment: post-procedure vital signs reviewed and stable Respiratory status: spontaneous breathing, respiratory function stable and nonlabored ventilation Cardiovascular status: blood pressure returned to baseline and stable Postop Assessment: no headache, no backache, no apparent nausea or vomiting and spinal receding Anesthetic complications: no    Last Vitals:  Vitals:   07/01/18 2202 07/01/18 2302  BP: (!) 140/92 (!) 134/94  Pulse: 86 85  Resp:  18  Temp:  (!) 36 C  SpO2: 100% 99%    Last Pain:  Vitals:   07/01/18 2302  TempSrc: Axillary  PainSc: 0-No pain   Pain Goal: Patients Stated Pain Goal: 0 (07/01/18 0922)               Lidia Collum

## 2018-07-01 NOTE — Anesthesia Preprocedure Evaluation (Addendum)
Anesthesia Evaluation  Patient identified by MRN, date of birth, ID band Patient awake    Reviewed: Allergy & Precautions, NPO status , Patient's Chart, lab work & pertinent test results  History of Anesthesia Complications Negative for: history of anesthetic complications  Airway Mallampati: II  TM Distance: >3 FB Neck ROM: Full    Dental no notable dental hx.    Pulmonary neg pulmonary ROS,    Pulmonary exam normal        Cardiovascular hypertension, negative cardio ROS Normal cardiovascular exam     Neuro/Psych PSYCHIATRIC DISORDERS Anxiety negative neurological ROS     GI/Hepatic negative GI ROS, Neg liver ROS,   Endo/Other  Hypothyroidism Morbid obesity  Renal/GU negative Renal ROS  negative genitourinary   Musculoskeletal negative musculoskeletal ROS (+)   Abdominal   Peds  Hematology negative hematology ROS (+)   Anesthesia Other Findings Chronic HTN w/ superimposed pre-eclampsia & severe range BPs; hx of thyroid cancer s/p resection with poorly controlled hypothyroidism (improved from earlier in pregnancy); BMI 47 Last meal 0800 - hot dog  Reproductive/Obstetrics (+) Pregnancy Prior C/S x1                            Anesthesia Physical Anesthesia Plan  ASA: III  Anesthesia Plan: Spinal   Post-op Pain Management:    Induction:   PONV Risk Score and Plan: 3 and Ondansetron and Treatment may vary due to age or medical condition  Airway Management Planned: Natural Airway  Additional Equipment: None  Intra-op Plan:   Post-operative Plan:   Informed Consent: I have reviewed the patients History and Physical, chart, labs and discussed the procedure including the risks, benefits and alternatives for the proposed anesthesia with the patient or authorized representative who has indicated his/her understanding and acceptance.     Plan Discussed with:   Anesthesia Plan  Comments:        Anesthesia Quick Evaluation

## 2018-07-01 NOTE — Op Note (Addendum)
Darcy D Sigel PROCEDURE DATE: 07/01/2018  PREOPERATIVE DIAGNOSES: Intrauterine pregnancy at [redacted]w[redacted]d weeks gestation; chronic hypertension with superimposed preeclampsia with severe features, elective repeat c-section  POSTOPERATIVE DIAGNOSES: The same  PROCEDURE: Repeat Low Transverse Cesarean Section  SURGEON:  Feliz Beam, MD  ASSISTANT:  none  ANESTHESIOLOGY TEAM: Anesthesiologist: Lidia Collum, MD CRNA: Flossie Dibble, CRNA  INDICATIONS: Lovinia D Locicero is a 32 y.o. [id number] at [redacted]w[redacted]d here for cesarean section secondary to the indications listed under preoperative diagnoses; please see preoperative note for further details.  The risks of cesarean section were discussed with the patient including but were not limited to: bleeding which may require transfusion or reoperation; infection which may require antibiotics; injury to bowel, bladder, ureters or other surrounding organs; injury to the fetus; need for additional procedures including hysterectomy in the event of a life-threatening hemorrhage; placental abnormalities wth subsequent pregnancies, incisional problems, thromboembolic phenomenon and other postoperative/anesthesia complications.  She consents to blood transfusion in the event of an emergency. The patient verbalized understanding of the plan, giving informed written consent for the procedure.    FINDINGS:  Viable female infant in cephalic presentation.  Apgars pending.  Clear amniotic fluid.  Intact placenta, three vessel cord.  Normal uterus with thin lower uterine segment, normal fallopian tubes and ovaries bilaterally.  ANESTHESIA: Epidural  INTRAVENOUS FLUIDS: 1500 ml   ESTIMATED BLOOD LOSS: 600 ml URINE OUTPUT:  150 ml SPECIMENS: Placenta sent to pathology COMPLICATIONS: None immediate  PROCEDURE IN DETAIL:  The patient preoperatively received intravenous antibiotics and had sequential compression devices applied to her lower extremities.  She was then  taken to the operating room where spinal anesthesia was administered and was found to be adequate. She was then placed in a dorsal supine position with a leftward tilt, and prepped and draped in a sterile manner.  A foley catheter was placed into her bladder with sterile technique and attached to constant gravity.  After a timeout was performed, a Pfannenstiel skin incision was made with scalpel over her preexisting scar and carried through to the underlying layer of fascia. Patient with significant oozing in subcutaneous tissue. The fascia was incised in the midline, and this incision was extended bilaterally using the Mayo scissors.  Kocher clamps were applied to the superior aspect of the fascial incision and the underlying rectus muscles were dissected off bluntly and sharply.  A similar process was carried out on the inferior aspect of the fascial incision. The rectus muscles were separated in the midline bluntly and the peritoneum was entered sharply. The peritoneal incision was carefully extended sharply laterally and caudad with good visualization of the bladder. The uterus appeared normal. An Alexis O ring retractor was placed carefully, no bowel adhered within retractor. The bladder blade was inserted. Attention was turned to the lower uterine segment where the bladder was noted to be tacked onto a thinned lower uterine segment, a bladder flap was carefully created and the bladder was gently dissected down, the bladder blade was re-inserted. A low transverse hysterotomy was made with a scalpel and extended bilaterally bluntly.  The infant was delivered from ROP position, nose and mouth were bulb suctioned, and the cord clamped and cut after 1 minute. The infant was then handed over to the waiting neonatology team. Uterine massage was then performed, and the placenta delivered intact with a three-vessel cord. The uterus was then gently exteriorized and cleared of clots and debris.  The hysterotomy was  closed with 0 Vicryl in a  running locked fashion, and an imbricating layer was also placed with 0 Vicryl. A third layer of suture was placed with 2-0 Vicryl for significant oozing along the left aspect of the hysterotomy to good effect. The fallopian tubes and ovaries were visualized bilaterally and normal appearing. The uterus was then gently replaced within the abdomen. The Alexis retractor was removed.   The pelvis was cleared of all clot and debris. Arista was placed over the hysterotomy for additional hemostasis.  Hemostasis was again confirmed on all surfaces. The peritoneum was re-approximated using 2-0 Vicryl suture. The fascia was then closed using 0 PDS in a running fashion.  The subcutaneous layer was irrigated, then reapproximated with 2-0 plain gut, a small amount of surgicel was placed into the right aspect of the incision for oozing and 30 ml of 0.5% Marcaine was injected subcutaneously around the incision.  The skin was closed with a 4-0 Monocryl subcuticular stitch. The patient tolerated the procedure well. Sponge, lap, instrument and needle counts were correct x 3.  She was taken to the recovery room in stable condition.    Feliz Beam, M.D. Attending [place], [facility] for [employer], [place]

## 2018-07-01 NOTE — MAU Note (Addendum)
Pt states she has recurrent yeast infections, OTC ointment isn't working.  Has vaginal burning & itching, white milky discharge.  Also lower abdominal pain x 3 days with walking.  Denies bleeding or LOF.  Reports good fetal movement. Has chronic HTN, on two meds, hasn't taken BP med yet this morning.

## 2018-07-01 NOTE — H&P (Signed)
Obstetric History and Physical  Loeta D Garlow is a 32 y.o. G3P1011 with IUP at 35w0dpresenting for yeast infection, vaginal itching. Patient states she has been having  none contractions, none vaginal bleeding, intact membranes, with active fetal movement.    Prenatal Course Source of Care: [facility]  with onset of care at 10 weeks Pregnancy complications or risks: Patient Active Problem List   Diagnosis Date Noted  . Group B Streptococcus carrier, +RV culture, currently pregnant 06/28/2018  . Chronic hypertension during pregnancy, antepartum 06/07/2018  . Substance abuse affecting pregnancy in third trimester, antepartum 05/18/2018  . RUQ pain 05/07/2018  . Other headache syndrome 05/07/2018  . Obesity, Class III, BMI 40-49.9 (morbid obesity) [place] 12/30/[place]  . Obesity affecting pregnancy, antepartum 12/30/2017  . Hypothyroidism during pregnancy, antepartum 12/30/2017  . Supervision of high risk pregnancy, antepartum 12/16/2017  . Chronic hypertension in pregnancy 12/16/2017  . History of cesarean delivery 12/16/2017  . 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  . Hematuria 11/11/2017  . Hypertension   . 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 [place] 09/11[place]   She plans to breastfeed She desires undecided for postpartum contraception.   Prenatal labs and studies: ABO, Rh: --/--/O POS (11/04 1417) Antibody: NEG (11/04 1417) Rubella: 3.74 (05/15 1205) RPR: Non Reactive (09/20 0950)  HBsAg: Negative (05/15 1205)  HIV: Non Reactive (09/20 0950)  GRVU:YEBXIDHW(11/20 1224) 1 hr GTT:  108 Genetic screening normal Anatomy [place]  Medical History:  Past Medical History:  Diagnosis Date  . Anxiety   . [place]   thy[place] cancer  . Hypertension   . Thyroid disease    cancer    Past Surgical History:  Procedure Laterality Date  . CESAREAN SECTION  2006  . DILATION AND EVACUATION N/A 02/07/2015   Procedure: DILATATION AND  EVACUATION;  Surgeon: CLavonia Drafts MD;  [facility];  Service: Gynecology;  Laterality: N/A;  . THYROIDECTOMY  02/2012   total L side first and R side 7 days later.    OB History  Gravida Para Term Preterm AB Living  _0 SAB TAB Ectopic Multiple Live Births  1     0 1    # Outcome Date GA Lbr Len/2nd Weight Sex Delivery Anes PTL Lv  3 Current           2 SAB 2017             Birth Comments: had d & C  1 Term 10/02/04 480w0d F CS-Unspec   LIV     Birth Comments: c/s - not sure why- but thinks it was due to either baby hr or her hr    Social History   Socioeconomic History  . Marital status: Single    Spouse name: Not on file  . Number of children: Not on file  . Years of education: Not on file  . Highest education level: Not on file  Occupational History  . Not on file  Social Needs  . Financial resource strain: Not hard at all  . Food insecurity:    Worry: Never true    Inability: Never true  . Transportation needs:    Medical: Yes    Non-medical: Not on file  Tobacco Use  . Smoking status: Never Smoker  . Smokeless tobacco: Never Used  Substance and Sexual Activity  . Alcohol use: No  . Drug use: Yes    Types: Marijuana  Comment: last used 05/19/18  . Sexual activity: Yes    Birth control/protection: None  Lifestyle  . Physical activity:    Days per week: Not on file    Minutes per session: Not on file  . Stress: Not at all  Relationships  . Social connections:    Talks on phone: Not on file    Gets together: Not on file    Attends religious service: Not on file    Active member of club or organization: Not on file    Attends meetings of clubs or organizations: Not on file    Relationship status: Not on file  Other Topics Concern  . Not on file  Social History Narrative  . Not on file    Family History  Problem Relation Age of Onset  . Hypertension Mother   . Blindness Mother        one eye    Medications Prior to  Admission  Medication Sig Dispense Refill Last Dose  . acetaminophen (TYLENOL) 500 MG tablet Take 1,000 mg by mouth every 8 (eight) hours as needed for headache.   06/23/2018 at Unknown time  . aspirin EC 81 MG tablet Take 1 tablet (81 mg total) by mouth daily. 30 tablet 1 06/23/2018 at Unknown time  . bisacodyl (DULCOLAX) 10 MG suppository Place 10 mg rectally as needed for moderate constipation.   Not Taking  . Blood Pressure Monitoring (ADULT BLOOD PRESSURE CUFF LG) KIT 1 kit by Does not apply route every morning. 1 each 0 Taking  . docusate sodium (COLACE) 100 MG capsule Take 1 capsule (100 mg total) by mouth 2 (two) times daily as needed. (Patient not taking: Reported on 06/07/2018) 30 capsule 2 Not Taking  . famotidine (PEPCID) 20 MG tablet Take 1 tablet (20 mg total) by mouth 2 (two) times daily. 60 tablet 3 06/23/2018 at Unknown time  . labetalol (NORMODYNE) 200 MG tablet Take 1 tablet (200 mg total) by mouth 2 (two) times daily. 60 tablet 3 06/23/2018 at Unknown time  . levothyroxine (SYNTHROID, LEVOTHROID) 200 MCG tablet Take 1 tablet (200 mcg total) by mouth daily before breakfast. 90 tablet 1 06/23/2018 at Unknown time  . levothyroxine (SYNTHROID, LEVOTHROID) 25 MCG tablet Take 1 tablet (25 mcg total) by mouth daily before breakfast. 30 tablet 3 06/23/2018 at Unknown time  . NIFEdipine (PROCARDIA XL/NIFEDICAL XL) 60 MG 24 hr tablet Take 1 tablet (60 mg total) by mouth 2 (two) times daily. 60 tablet 0 06/23/2018 at Unknown time    Allergies  Allergen Reactions  . Hydrocodone Nausea And Vomiting    Pt says she is not allergic, she stated allergy so the ED wouldn't medicate her.     Review of Systems: Negative except for what is mentioned in HPI.  Physical Exam: BP (!) 180/107   Pulse 94   Temp 97.9 F (36.6 C) (Oral)   Resp 18   Wt 117.9 kg   LMP 10/08/2017   BMI 47.55 kg/m  CONSTITUTIONAL: Well-developed, well-nourished female in no acute distress.  HENT:  Normocephalic,  atraumatic, External right and left ear normal. Oropharynx is clear and moist EYES: Conjunctivae and EOM are normal. Pupils are equal, round, and reactive to light. No scleral icterus.  NECK: Normal range of motion, supple, no masses SKIN: Skin is warm and dry. No rash noted. Not diaphoretic. No erythema. No pallor. NEUROLOGIC: Alert and oriented to person, place, and time. Normal reflexes, muscle tone coordination. No cranial nerve deficit noted. PSYCHIATRIC:  Normal mood and affect. Normal behavior. Normal judgment and thought content. CARDIOVASCULAR: Normal heart rate noted, regular rhythm RESPIRATORY: Effort and breath sounds normal, no problems with respiration noted ABDOMEN: Soft, nontender, nondistended, gravid. MUSCULOSKELETAL: Normal range of motion. No edema and no tenderness. 2+ distal pulses.  Cervical Exam: Deferred Presentation: cephalic FHT:  Baseline rate 130 bpm   Variability moderate  Accelerations present   Decelerations none Contractions: irritable   Pertinent Labs/Studies:   Results for orders placed or performed during the hospital encounter of 07/01/18 (from the past 24 hour(s))  Urinalysis, Routine w reflex microscopic     Status: Abnormal   Collection Time: 07/01/18  9:12 AM  Result Value Ref Range   Color, Urine YELLOW YELLOW   APPearance HAZY (A) CLEAR   Specific Gravity, Urine 1.015 1.005 - 1.030   pH 5.0 5.0 - 8.0   Glucose, UA NEGATIVE NEGATIVE mg/dL   Hgb urine dipstick MODERATE (A) NEGATIVE   Bilirubin Urine NEGATIVE NEGATIVE   Ketones, ur NEGATIVE NEGATIVE mg/dL   Protein, ur NEGATIVE NEGATIVE mg/dL   Nitrite NEGATIVE NEGATIVE   Leukocytes, UA LARGE (A) NEGATIVE   RBC / HPF 6-10 0 - 5 RBC/hpf   WBC, UA 6-10 0 - 5 WBC/hpf   Bacteria, UA RARE (A) NONE SEEN   Squamous Epithelial / LPF 0-5 0 - 5   Mucus PRESENT   Protein / creatinine ratio, urine     Status: Abnormal   Collection Time: 07/01/18  9:12 AM  Result Value Ref Range   Creatinine, Urine  137.00 mg/dL   Total Protein, Urine 31 mg/dL   Protein Creatinine Ratio 0.23 (H) 0.00 - 0.15 mg/mg[Cre]  CBC     Status: Abnormal   Collection Time: 07/01/18  9:53 AM  Result Value Ref Range   WBC 7.8 4.0 - 10.5 K/uL   RBC 3.57 (L) 3.87 - 5.11 MIL/uL   Hemoglobin 11.4 (L) 12.0 - 15.0 g/dL   HCT 33.3 (L) 36.0 - 46.0 %   MCV 93.3 80.0 - 100.0 fL   MCH 31.9 26.0 - 34.0 pg   MCHC 34.2 30.0 - 36.0 g/dL   RDW 13.3 11.5 - 15.5 %   Platelets 242 150 - 400 K/uL   nRBC 0.0 0.0 - 0.2 %  Comprehensive metabolic panel     Status: Abnormal   Collection Time: 07/01/18  9:53 AM  Result Value Ref Range   Sodium 136 135 - 145 mmol/L   Potassium 3.4 (L) 3.5 - 5.1 mmol/L   Chloride 109 98 - 111 mmol/L   CO2 19 (L) 22 - 32 mmol/L   Glucose, Bld 97 70 - 99 mg/dL   BUN 9 6 - 20 mg/dL   Creatinine, Ser 0.63 0.44 - 1.00 mg/dL   Calcium 8.6 (L) 8.9 - 10.3 mg/dL   Total Protein 6.5 6.5 - 8.1 g/dL   Albumin 3.2 (L) 3.5 - 5.0 g/dL   AST 23 15 - 41 U/L   ALT 16 0 - 44 U/L   Alkaline Phosphatase 88 38 - 126 U/L   Total Bilirubin 0.8 0.3 - 1.2 mg/dL   GFR calc non Af Amer >60 >60 mL/min   GFR calc Af Amer >60 >60 mL/min   Anion gap 8 5 - 15    Assessment : Kemiya D Camberos is a 32 y.o. G3P1011 at 4w0dwho presents for vaginal itching, noted to have severe range BP however, had not taken meds this am. She was given her regular  meds and BP improved shortly thereafter, however on further monitoring, her BP became severe again and patient reported headache that did not improve despite drinking Pepsi, which usually improves her headaches. Given no improvement in headache, severe range BP significantly worse than last week, recommended for delivery. Reviewed risks/benefits and she is agreeable to delivery. She desires repeat c-section. The risks of cesarean section were discussed with the patient; including but not limited to: infection which may require antibiotics; bleeding which may require transfusion or  re-operation; injury to bowel, bladder, ureters or other surrounding organs; injury to the fetus; need for additional procedures including hysterectomy in the event of a life-threatening hemorrhage; placental abnormalities wth subsequent pregnancies, risk of needing c-sections in future pregnancies, incisional problems, thromboembolic phenomenon and other postoperative/anesthesia complications. Answered all questions. The patient verbalized understanding of the plan, giving informed consent for the procedure. She is agreeable to blood transfusion in the event of emergency.   Plan:  cHTN with superimposed preeclampsia with severe features - IV hydralazine per protocol for BP management - MgSO4 for PEC with severe features  FWB  - Cat I fetal heart tracing   - GBS positive  Hypothyroidism - cont home meds - NICU aware  To OR at 4 pm (8 hrs from breakfast)  Will start MgSO4 now NPO Anesthesia and OR aware Preoperative prophylactic antibiotics and SCDs ordered on call to the OR    K. Arvilla Meres, M.D. Attending Center for [employer] (Faculty Practice)  07/01/2018, 2:22 PM

## 2018-07-01 NOTE — Anesthesia Procedure Notes (Signed)
Spinal  Patient location during procedure: OR Staffing Anesthesiologist: Letasha Kershaw E, MD Performed: anesthesiologist  Preanesthetic Checklist Completed: patient identified, surgical consent, pre-op evaluation, timeout performed, IV checked, risks and benefits discussed and monitors and equipment checked Spinal Block Patient position: sitting Prep: site prepped and draped and DuraPrep Patient monitoring: continuous pulse ox, blood pressure and heart rate Approach: midline Location: L3-4 Injection technique: single-shot Needle Needle type: Pencan  Needle gauge: 24 G Needle length: 9 cm Additional Notes Functioning IV was confirmed and monitors were applied. Sterile prep and drape, including hand hygiene and sterile gloves were used. The patient was positioned and the spine was prepped. The skin was anesthetized with lidocaine.  Free flow of clear CSF was obtained prior to injecting local anesthetic into the CSF. The needle was carefully withdrawn. The patient tolerated the procedure well.      

## 2018-07-01 NOTE — Transfer of Care (Signed)
Immediate Anesthesia Transfer of Care Note  Patient: [name]  Procedure(s) Performed: CESAREAN SECTION (N/A Abdomen)  Patient Location: PACU  Anesthesia Type:Spinal  Level of Consciousness: awake, alert  and oriented  Airway & Oxygen Therapy: Patient Spontanous Breathing  Post-op Assessment: Report given to RN and Post -op Vital signs reviewed and stable  Post vital signs: Reviewed and stable  Last Vitals:  Vitals Value Taken Time  BP 138/79 07/01/2018  6:25 PM  Temp    Pulse 83 07/01/2018  6:26 PM  Resp 13 07/01/2018  6:26 PM  SpO2 100 % 07/01/2018  6:26 PM  Vitals shown include unvalidated device data.  Last Pain:  Vitals:   07/01/18 1516  TempSrc: Oral  PainSc:       Patients Stated Pain Goal: 0 (41/93/79 0240)  Complications: No apparent anesthesia complications

## 2018-07-01 NOTE — Consult Note (Signed)
Neonatology Note:   Attendance a t C-section:    I was asked by Dr. Rosana Hoes to attend this  C/S at term. The mother is a G3P2012, GBS + ( Ancef x1 PTD) with good prenatal care. Pregnancy complicated by GBS, CHTN on Labetaolol and NIfedipine, Substance abuse (THC, barbituates), obesity, anxiety and hypothyroidism on Synthroid (s/p thyroidectomy for Thyroid cancer).  BTMZ complete 11/5. ROM 0 hours before delivery, fluid clear. Infant vigorous with good spontaneous cry and tone. Needed only minimal bulb suctioning. Ap 8/9. Lungs clear to ausc in DR. To CN to care of Pediatrician.  Monia Sabal Katherina Mires, MD

## 2018-07-01 NOTE — MAU Note (Signed)
Dr. Rosana Hoes discussing C/S with pt.

## 2018-07-01 NOTE — MAU Provider Note (Signed)
Chief Complaint:  Vaginal Discharge; vaginal irritation; and Abdominal Pain   First Provider Initiated Contact with Patient 07/01/18 0935     HPI: [name] is a 32 y.o. G3P1011 at 43w0dwho presents to maternity admissions reporting recurrent yeast infections that OTC ointment is not working to treat. She reports "it kept me up all night." She reports vaginal burning and itching with a white, milky discharge. She also reports lower abdominal pain x 3 days with walking. She has cHTN taking Labetalol & Procardia. She has not taken those BP meds this morning, because she usually takes them at 1030 every morning.  Denies contractions, leakage of fluid or vaginal bleeding. Good fetal movement.  Location: vulva Quality: moderate Modifying factors: nothing has helped Associated signs and symptoms: burning & itching  Pregnancy Course:   Past Medical History:  Diagnosis Date  . Anxiety   . [place]   thyroid cancer  . Hypertension   . Thyroid disease    cancer   OB History  Gravida Para Term Preterm AB Living  '3 2 2   1 2  ' SAB TAB Ectopic Multiple Live Births  1     0 2    # Outcome Date GA Lbr Len/2nd Weight Sex Delivery Anes PTL Lv  3 Term 07/01/18 [id number] g F CS-LTranv Spinal  LIV  2 SAB 2017             Birth Comments: had d & C  1 Term 10/02/04 [id number] CS-Unspec   LIV     Birth Comments: c/s - not sure why- but thinks it was due to either baby hr or her hr   Past Surgical History:  Procedure Laterality Date  . CESAREAN SECTION  2006  . DILATION AND EVACUATION N/A 02/07/2015   Procedure: DILATATION AND EVACUATION;  Surgeon: CarLavonia DraftsD;  Location: [facility];  Service: Gynecology;  Laterality: N/A;  . THYROIDECTOMY  02/2012   total L side first and R side 7 days later.   Family History  Problem Relation Age of Onset  . Hypertension Mother   . Blindness Mother        one eye   Social History   Tobacco Use  . Smoking status: Never  Smoker  . Smokeless tobacco: Never Used  Substance Use Topics  . Alcohol use: No  . Drug use: Yes    Types: Marijuana    Comment: last used 05/19/18   Allergies  Allergen Reactions  . Hydrocodone Nausea And Vomiting    Pt says she is not allergic, she stated allergy so the ED wouldn't medicate her.    Medications Prior to Admission  Medication Sig Dispense Refill Last Dose  . acetaminophen (TYLENOL) 500 MG tablet Take 1,000 mg by mouth every 8 (eight) hours as needed for headache.   06/23/2018 at Unknown time  . aspirin EC 81 MG tablet Take 1 tablet (81 mg total) by mouth daily. 30 tablet 1 06/23/2018 at Unknown time  . bisacodyl (DULCOLAX) 10 MG suppository Place 10 mg rectally as needed for moderate constipation.   Not Taking  . Blood Pressure Monitoring (ADULT BLOOD PRESSURE CUFF LG) KIT 1 kit by Does not apply route every morning. 1 each 0 Taking  . docusate sodium (COLACE) 100 MG capsule Take 1 capsule (100 mg total) by mouth 2 (two) times daily as needed. (Patient not taking: Reported on 06/07/2018) 30 capsule 2 Not Taking  . famotidine (PEPCID)  20 MG tablet Take 1 tablet (20 mg total) by mouth 2 (two) times daily. 60 tablet 3 06/23/2018 at Unknown time  . labetalol (NORMODYNE) 200 MG tablet Take 1 tablet (200 mg total) by mouth 2 (two) times daily. 60 tablet 3 06/23/2018 at Unknown time  . levothyroxine (SYNTHROID, LEVOTHROID) 200 MCG tablet Take 1 tablet (200 mcg total) by mouth daily before breakfast. 90 tablet 1 06/23/2018 at Unknown time  . levothyroxine (SYNTHROID, LEVOTHROID) 25 MCG tablet Take 1 tablet (25 mcg total) by mouth daily before breakfast. 30 tablet 3 06/23/2018 at Unknown time  . NIFEdipine (PROCARDIA XL/NIFEDICAL XL) 60 MG 24 hr tablet Take 1 tablet (60 mg total) by mouth 2 (two) times daily. 60 tablet 0 06/23/2018 at Unknown time    I have reviewed patient's Past Medical Hx, Surgical Hx, Family Hx, Social Hx, medications and allergies.   ROS:  Review of Systems   Constitutional: Negative.   HENT: Negative.   Eyes: Negative.   Respiratory: Negative.   Cardiovascular: Negative.   Gastrointestinal: Positive for abdominal pain (lower with walking).  Endocrine: Negative.   Genitourinary: Positive for vaginal pain (itching & burning).  Musculoskeletal: Negative.   Skin: Negative.   Allergic/Immunologic: Negative.   Neurological: Negative.   Hematological: Negative.   Psychiatric/Behavioral: Negative.     Physical Exam   Patient Vitals for the past 24 hrs:  BP Temp Temp src Pulse Resp SpO2 Weight  07/01/18 1930 (!) 156/102 (!) 97.5 F (36.4 C) - 88 17 100 % -  07/01/18 1915 (!) 158/94 - - 84 (!) 21 100 % -  07/01/18 1900 (!) 148/93 (!) 97.5 F (36.4 C) Oral 76 (!) 27 98 % -  07/01/18 1845 (!) 145/92 - - 80 14 99 % -  07/01/18 1840 - - - 73 11 100 % -  07/01/18 1830 130/89 (!) 96.8 F (36 C) Axillary 78 16 100 % -  07/01/18 1825 138/79 - - 86 11 97 % -  07/01/18 1616 139/85 - - 89 - - -  07/01/18 1601 130/78 - - 97 16 100 % -  07/01/18 1546 (!) 153/102 - - 91 - - -  07/01/18 1531 136/85 - - 90 - - -  07/01/18 1516 136/90 98.5 F (36.9 C) Oral 91 20 100 % -  07/01/18 1501 (!) 156/98 - - 87 - - -  07/01/18 1446 137/85 - - 90 - - -  07/01/18 1436 (!) 140/98 - - 91 - - -  07/01/18 1402 (!) 180/107 - - 94 - - -  07/01/18 1346 (!) 174/108 - - 88 - - -  07/01/18 1330 (!) 168/111 - - 88 - - -  07/01/18 1316 (!) 154/103 - - 80 - - -  07/01/18 1301 (!) 145/97 - - 83 - - -  07/01/18 1246 (!) 145/103 - - 95 - - -  07/01/18 1231 (!) 157/99 - - 80 - - -  07/01/18 1216 (!) 144/98 - - 79 - - -  07/01/18 1201 (!) 153/103 - - 91 - - -  07/01/18 1146 (!) 163/109 - - 82 - - -  07/01/18 1131 (!) 180/103 - - 83 - - -  07/01/18 1116 (!) 163/102 - - 86 - - -  07/01/18 1101 (!) 158/105 - - 87 - - -  07/01/18 1046 (!) 166/98 - - 85 - - -  07/01/18 1031 (!) 164/102 - - 84 - - -  07/01/18 1016 (!) 163/91 - -  81 - - -  07/01/18 1001 (!) 166/97 - - 85 - -  -  07/01/18 0946 (!) 163/109 - - 91 - - -  07/01/18 0930 (!) 149/95 - - 93 - - -  07/01/18 0920 (!) 152/95 97.9 F (36.6 C) Oral 99 18 - -  07/01/18 0910 - - - - - - 117.9 kg    Constitutional: Well-developed, well-nourished female in no acute distress.  Cardiovascular: normal rate & rhythm, no murmur Respiratory: normal effort, lung sounds clear throughout GI: Abd soft, non-tender, gravid appropriate for gestational age. Pos BS x 4 MS: Extremities nontender, no edema, normal ROM Neurologic: Alert and oriented x 4.  GU: Pelvic: deferred  NST - FHR: 135 bpm / moderate variability / accels present / decels absent / TOCO: occ UI noted   Labs: Results for orders placed or performed during the hospital encounter of 07/01/18 (from the past 24 hour(s))  Urinalysis, Routine w reflex microscopic     Status: Abnormal   Collection Time: 07/01/18  9:12 AM  Result Value Ref Range   Color, Urine YELLOW YELLOW   APPearance HAZY (A) CLEAR   Specific Gravity, Urine 1.015 1.005 - 1.030   pH 5.0 5.0 - 8.0   Glucose, UA NEGATIVE NEGATIVE mg/dL   Hgb urine dipstick MODERATE (A) NEGATIVE   Bilirubin Urine NEGATIVE NEGATIVE   Ketones, ur NEGATIVE NEGATIVE mg/dL   Protein, ur NEGATIVE NEGATIVE mg/dL   Nitrite NEGATIVE NEGATIVE   Leukocytes, UA LARGE (A) NEGATIVE   RBC / HPF 6-10 0 - 5 RBC/hpf   WBC, UA 6-10 0 - 5 WBC/hpf   Bacteria, UA RARE (A) NONE SEEN   Squamous Epithelial / LPF 0-5 0 - 5   Mucus PRESENT   Protein / creatinine ratio, urine     Status: Abnormal   Collection Time: 07/01/18  9:12 AM  Result Value Ref Range   Creatinine, Urine 137.00 mg/dL   Total Protein, Urine 31 mg/dL   Protein Creatinine Ratio 0.23 (H) 0.00 - 0.15 mg/mg[Cre]  CBC     Status: Abnormal   Collection Time: 07/01/18  9:53 AM  Result Value Ref Range   WBC 7.8 4.0 - 10.5 K/uL   RBC 3.57 (L) 3.87 - 5.11 MIL/uL   Hemoglobin 11.4 (L) 12.0 - 15.0 g/dL   HCT 33.3 (L) 36.0 - 46.0 %   MCV 93.3 80.0 - 100.0 fL   MCH  31.9 26.0 - 34.0 pg   MCHC 34.2 30.0 - 36.0 g/dL   RDW 13.3 11.5 - 15.5 %   Platelets 242 150 - 400 K/uL   nRBC 0.0 0.0 - 0.2 %  Comprehensive metabolic panel     Status: Abnormal   Collection Time: 07/01/18  9:53 AM  Result Value Ref Range   Sodium 136 135 - 145 mmol/L   Potassium 3.4 (L) 3.5 - 5.1 mmol/L   Chloride 109 98 - 111 mmol/L   CO2 19 (L) 22 - 32 mmol/L   Glucose, Bld 97 70 - 99 mg/dL   BUN 9 6 - 20 mg/dL   Creatinine, Ser 0.63 0.44 - 1.00 mg/dL   Calcium 8.6 (L) 8.9 - 10.3 mg/dL   Total Protein 6.5 6.5 - 8.1 g/dL   Albumin 3.2 (L) 3.5 - 5.0 g/dL   AST 23 15 - 41 U/L   ALT 16 0 - 44 U/L   Alkaline Phosphatase 88 38 - 126 U/L   Total Bilirubin 0.8 0.3 - 1.2 mg/dL  GFR calc non Af Amer >60 >60 mL/min   GFR calc Af Amer >60 >60 mL/min   Anion gap 8 5 - 15  Type and screen [place]     Status: None   Collection Time: 07/01/18  9:53 AM  Result Value Ref Range   ABO/RH(D) O POS    Antibody Screen NEG    Sample Expiration      07/04/2018 Performed at [facility], [address], [place], [place] [address]     Imaging:  No results found.  MAU Course: Orders Placed This Encounter  Procedures  . Urinalysis, Routine w reflex microscopic  . CBC  . Comprehensive metabolic panel  . Protein / creatinine ratio, urine  . RPR  . TSH  . CBC  . Creatinine, serum  . Creatinine, serum  . Comprehensive metabolic panel  . Magnesium  . Diet NPO time specified Except for: Sips with Meds  . SCD's  . Practitioner attestation of consent  . Complete patient signature process for consent form  . Provider attestation of informed consent for procedure/surgical case  . Informed Consent Details: Transcribe to consent form and obtain patient signature  . Initiate Pre-op Protocol  . Verify informed consent  .  Sequential compression device (SCD's) to OR  . Clip operative site  . Instruct patient regarding incentive spirometry  . Instruct patient in  turn cough deep breathe and leg exercises  . Pre-admission testing diagnosis  . No pregnance test required  . Discharge  per PACU criteria  . Vital signs  . Cardiac monitoring  . Notify physician / Nurse Anesthetist  . Notify Anesthesia  . Ambulate patient  . Assess: Sedation scale  . Assess: Pain rating and location  . Assess: Motor / Sensory  . Assess: motor/sensory Q2h for 4 hours after the epidural is discontinued.  . Assess: Epidural insertion site  . Assess: for urinary retention following [place] Urinary Catheter Guidelines.  . NO narcotics or CNS depressants by any route within 12 hours of last Duramorph administration, or within 3 hours of last fentanyl dose, without prior approval of the anesthesiologist  . Infuse only with non-injectable tubing. Label epidural catheter and infusion pump with a "DO NOT INJECT" sticker.  Marland Kitchen Keep oxygen setup at bedside during infusion and 4 hours after the infusion is discontinued.  . Maintain IV access  . Check respiratory rate and depth of respiration  . Pulse oximetry  . Document [place] Opioid-Induced Sedation Scale (POSS) per protocol (see sidebar report)  . Measure blood pressure  . SCD's  . Vital signs: On admission, Q1hr x 1, Q4hr x 3, Q8hr until discharge  . Notify Physician  . Operative Day: Within 4 hours patient is to be out of bed with assistance mobilizing (standing, walking, or sitting in chair) for a total of 2 hours. Walking in hallways as tolerated. Sitting on side of bed does not meet this requirement. Must be ...  . POD# 1: Patient is to be mobilizing with assistance (standing, walking, or sitting in chair) for a total of 6 hours. Walking in the hallways with assistance at least 4 times.  Marland Kitchen POD# 2 and beyond: Patient is to be mobilizing with assistance (standing, walking, or sitting in chair) for a total of 6 hours. Walking in the hallways with assistance at least 4 times.  . Advance diet as tolerated  . Fundal check:  every 15 minutes x 1 hour, then every hour x 4 hours  .  Ice packs to perineum or breast  . Straight catheter  . Breast pump, breast shells, coconut oil  . K-pad  . Intake and output  . Remove foley catheter once ambulating  . If RhoGam is indicated order per protocol  . If diabetic or glucose greater than 140 notify MD to place Glycemic Control Order Set  . May draw maternal lab samples for Cord Blood Donation and/or Placenta Recovery Program  . Ice pack to largest incision  . Maintain dressing Dressing to remain in place for a minimum of 48 hours  . Initiate Oral Care Protocol  . Initiate Carrier Fluid Protocol  . Turn cough deep breathe  . IF [facility] donor Maternal draw Day 1: 1-5 ml red top (clot tube); 1-7 ml red top (clot tube; 2-7 ml lavender top (EDTA tubes); 1-5 ml white top (PPT tube)  . Full code  . Pulse oximetry, continuous  . Oxygen therapy Mode or (Route): Nasal cannula; FiO2: OTHER SEE COMMENTS; Liters Per Minute: 2; Keep 02 saturation: greater than or equal to 92 %  . Incentive spirometry  . Type and screen [place]  . Insert peripheral IV  . May discontinue IV or convert to Saline Lock when tolerating PO fluids well and temperature < 100.5 F  . Admit to Inpatient (patient's expected length of stay will be greater than 2 midnights or inpatient only procedure)  . Admit to Inpatient (patient's expected length of stay will be greater than 2 midnights or inpatient only procedure)  . Transfer patient   Meds ordered this encounter  Medications  . labetalol (NORMODYNE) tablet 200 mg  . NIFEdipine (PROCARDIA-XL/NIFEDICAL-XL) 24 hr tablet 60 mg  . sodium citrate-citric acid (ORACIT) solution 30 mL  . famotidine (PEPCID) IVPB 20 mg premix  . DISCONTD: lactated ringers infusion  . ceFAZolin (ANCEF) IVPB 2g/100 mL premix    Order Specific Question:   Indication:    Answer:   Surgical Prophylaxis  . sodium citrate-citric acid (ORACIT)  solution 30 mL  . lactated ringers infusion  . DISCONTD: hydrALAZINE (APRESOLINE) injection 5 mg  . DISCONTD: hydrALAZINE (APRESOLINE) injection 10 mg  . DISCONTD: labetalol (NORMODYNE,TRANDATE) injection 20 mg  . DISCONTD: labetalol (NORMODYNE,TRANDATE) injection 40 mg  . magnesium bolus via infusion 4 g  . magnesium sulfate 40 grams in LR 500 mL OB infusion  . OR Linked Order Group   . oxyCODONE (Oxy IR/ROXICODONE) immediate release tablet 5 mg   . oxyCODONE (ROXICODONE) 5 MG/5ML solution 5 mg  . fentaNYL (SUBLIMAZE) injection 25-50 mcg  . ondansetron (ZOFRAN) injection 4 mg  . AND Linked Order Group   . naloxone [facility]) injection 0.4 mg   . sodium chloride flush (NS) 0.9 % injection 3 mL  . scopolamine (TRANSDERM-SCOP) 1 MG/3DAYS 1.5 mg  . ondansetron (ZOFRAN) injection 4 mg  . OR Linked Order Group   . nalbuphine (NUBAIN) injection 5 mg   . nalbuphine (NUBAIN) injection 5 mg  . OR Linked Order Group   . nalbuphine (NUBAIN) injection 5 mg   . nalbuphine (NUBAIN) injection 5 mg  . OR Linked Order Group   . diphenhydrAMINE (BENADRYL) injection 12.5 mg   . diphenhydrAMINE (BENADRYL) capsule 25 mg  . naloxone HCl (NARCAN) 2 mg in dextrose 5 % 250 mL infusion  . meperidine (DEMEROL) injection 6.25 mg  . DISCONTD: sodium chloride irrigation 0.9 %  . DISCONTD: bupivacaine (MARCAINE) 0.5 % injection  . levothyroxine (SYNTHROID, LEVOTHROID) tablet 225 mcg  .  labetalol (NORMODYNE) tablet 200 mg  . oxytocin (PITOCIN) IV infusion 40 units in LR 1000 mL - Premix  . lactated ringers infusion  . ibuprofen (ADVIL,MOTRIN) tablet 600 mg  . acetaminophen (TYLENOL) tablet 650 mg  . zolpidem (AMBIEN) tablet 5 mg  . diphenhydrAMINE (BENADRYL) capsule 25 mg  . senna-docusate (Senokot-S) tablet 2 tablet  . simethicone (MYLICON) chewable tablet 80 mg  . simethicone (MYLICON) chewable tablet 80 mg  . simethicone (MYLICON) chewable tablet 80 mg  . prenatal multivitamin tablet 1 tablet  .  menthol-cetylpyridinium (CEPACOL) lozenge 3 mg  . AND Linked Order Group   . witch hazel-glycerin (TUCKS) pad 1 application   . dibucaine (NUPERCAINAL) 1 % rectal ointment 1 application  . coconut oil  . Tdap (BOOSTRIX) injection 0.5 mL  . enoxaparin (LOVENOX) injection 40 mg  . oxyCODONE (Oxy IR/ROXICODONE) immediate release tablet 5 mg  . oxyCODONE (Oxy IR/ROXICODONE) immediate release tablet 10 mg  . fentaNYL (SUBLIMAZE) 100 MCG/2ML injection    Mcleod, Kristin   : cabinet override    MDM: [facility]UA CBC CMP P/C Ratio  *Consult with Dr. Rosana Hoes @ 1130 - notified of patient's complaints, assessments, lab, NST, & BP's results, recommended tx plan observe x 1 hour to determine need for C/S today or d/c home   Assessment: cHTN with Superimposed PEC  Plan: Prepare for OR Dr Rosana Hoes assumes care of patient   Allergies as of 07/01/2018      Reactions   Hydrocodone Nausea And Vomiting   Pt says she is not allergic, she stated allergy so the ED wouldn't medicate her.      Laury Deep, CNM 07/01/2018 9:38 AM

## 2018-07-02 ENCOUNTER — Encounter (HOSPITAL_COMMUNITY): Payer: Self-pay | Admitting: Obstetrics and Gynecology

## 2018-07-02 LAB — CBC
HCT: 29.1 % — ABNORMAL LOW (ref 36.0–46.0)
Hemoglobin: 10.3 g/dL — ABNORMAL LOW (ref 12.0–15.0)
MCH: 32.8 pg (ref 26.0–34.0)
MCHC: 35.4 g/dL (ref 30.0–36.0)
MCV: 92.7 fL (ref 80.0–100.0)
Platelets: 236 10*3/uL (ref 150–400)
RBC: 3.14 MIL/uL — ABNORMAL LOW (ref 3.87–5.11)
RDW: 13.2 % (ref 11.5–15.5)
WBC: 10.3 10*3/uL (ref 4.0–10.5)
nRBC: 0 % (ref 0.0–0.2)

## 2018-07-02 LAB — COMPREHENSIVE METABOLIC PANEL
ALT: 14 U/L (ref 0–44)
AST: 22 U/L (ref 15–41)
Albumin: 2.7 g/dL — ABNORMAL LOW (ref 3.5–5.0)
Alkaline Phosphatase: 76 U/L (ref 38–126)
Anion gap: 7 (ref 5–15)
BUN: 8 mg/dL (ref 6–20)
CO2: 21 mmol/L — ABNORMAL LOW (ref 22–32)
Calcium: 7.4 mg/dL — ABNORMAL LOW (ref 8.9–10.3)
Chloride: 104 mmol/L (ref 98–111)
Creatinine, Ser: 0.53 mg/dL (ref 0.44–1.00)
GFR calc non Af Amer: >60 mL/min (ref >60)
Glucose, Bld: 95 mg/dL (ref 70–99)
Potassium: 3.8 mmol/L (ref 3.5–5.1)
Sodium: 132 mmol/L — ABNORMAL LOW (ref 135–145)
Total Bilirubin: 0.6 mg/dL (ref 0.3–1.2)
Total Protein: 5.7 g/dL — ABNORMAL LOW (ref 6.5–8.1)

## 2018-07-02 LAB — TSH: TSH: 11.44 u[IU]/mL — ABNORMAL HIGH (ref 0.350–4.500)

## 2018-07-02 LAB — MAGNESIUM: Magnesium: 4.3 mg/dL — ABNORMAL HIGH (ref 1.7–2.4)

## 2018-07-02 NOTE — Addendum Note (Signed)
Addendum  created 07/02/18 3794 by Hewitt Blade, CRNA   Sign clinical note

## 2018-07-02 NOTE — Anesthesia Postprocedure Evaluation (Signed)
Anesthesia Post Note  Patient: [name]  Procedure(s) Performed: CESAREAN SECTION (N/A Abdomen)     Patient location during evaluation: [facility] Anesthesia Type: Spinal Level of consciousness: awake and alert Pain management: pain level controlled Vital Signs Assessment: post-procedure vital signs reviewed and stable Respiratory status: spontaneous breathing, nonlabored ventilation and respiratory function stable Cardiovascular status: stable Postop Assessment: no headache, no backache, spinal receding, able to ambulate, adequate PO intake, no apparent nausea or vomiting and patient able to bend at knees Anesthetic complications: no    Last Vitals:  Vitals:   07/02/18 0700 07/02/18 0754  BP:  130/86  Pulse:  79  Resp:  18  Temp:  36.4 C  SpO2: 100% 100%    Last Pain:  Vitals:   07/02/18 0754  TempSrc: Oral  PainSc:    Pain Goal: Patients Stated Pain Goal: 0 (07/01/18 0922)               Jabier Mutton

## 2018-07-02 NOTE — Progress Notes (Signed)
Faculty Attending Note  Post Op Day 1  Subjective: Patient is feeling well. She reports moderately well controlled pain on PO pain meds. She is ambulating and denies light-headedness or dizziness. She is voiding spontaneously. She is not passing flatus, has not had a BM yet. She is tolerating clears without nausea/vomiting. Bleeding is moderate. She is breast & bottle feeding. Baby is in NICU and doing well.  Objective: Blood pressure 140/85, pulse 82, temperature 98 F (36.7 C), resp. rate 17, weight 117.9 kg, last menstrual period 10/08/2017, SpO2 100 %, unknown if currently breastfeeding. Temp:  [96.8 F (36 C)-98.5 F (36.9 C)] 98 F (36.7 C) (11/29 0517) Pulse Rate:  [73-99] 82 (11/29 0517) Resp:  [11-27] 17 (11/29 0517) BP: (120-180)/(77-111) 140/85 (11/29 0517) SpO2:  [97 %-100 %] 100 % (11/29 0517) Weight:  [117.9 kg] 117.9 kg (11/28 0910)  Physical Exam:  General: alert, oriented, cooperative Chest: CTAB, normal respiratory effort Heart: RRR  Abdomen: +BS, soft, appropriately tender to palpation, incision covered by dressing with no evidence of active bleeding, Prevena in place  Uterine Fundus: firm, 2 fingers below the umbilicus Lochia: moderate, rubra DVT Evaluation: no evidence of DVT Extremities: no edema, no calf tenderness  UOP: > 100 mL/hr clear yellow urine   Current Facility-Administered Medications:  .  acetaminophen (TYLENOL) tablet 650 mg, 650 mg, Oral, Q4H PRN, Sloan Leiter, MD .  coconut oil, 1 application, Topical, PRN, Sloan Leiter, MD .  witch hazel-glycerin (TUCKS) pad 1 application, 1 application, Topical, PRN **AND** dibucaine (NUPERCAINAL) 1 % rectal ointment 1 application, 1 application, Rectal, PRN, Sloan Leiter, MD .  diphenhydrAMINE (BENADRYL) injection 12.5 mg, 12.5 mg, Intravenous, Q4H PRN **OR** diphenhydrAMINE (BENADRYL) capsule 25 mg, 25 mg, Oral, Q4H PRN, Lidia Collum, MD .  diphenhydrAMINE (BENADRYL) capsule 25 mg, 25 mg, Oral,  Q6H PRN, Sloan Leiter, MD .  enoxaparin (LOVENOX) injection 60 mg, 60 mg, Subcutaneous, Q24H, Sloan Leiter, MD .  ibuprofen (ADVIL,MOTRIN) tablet 600 mg, 600 mg, Oral, Q6H, Sloan Leiter, MD, 600 mg at 07/02/18 0553 .  labetalol (NORMODYNE) tablet 200 mg, 200 mg, Oral, BID, Sloan Leiter, MD, 200 mg at 07/01/18 2046 .  lactated ringers infusion, , Intravenous, Continuous, Sloan Leiter, MD, Last Rate: 38 mL/hr at 07/01/18 2347 .  levothyroxine (SYNTHROID, LEVOTHROID) tablet 225 mcg, 225 mcg, Oral, Q0600, Sloan Leiter, MD, 225 mcg at 07/02/18 0553 .  magnesium sulfate 40 grams in LR 500 mL OB infusion, 2 g/hr, Intravenous, Continuous, Sloan Leiter, MD, Last Rate: 25 mL/hr at 07/01/18 1629, 2 g/hr at 07/01/18 1629 .  menthol-cetylpyridinium (CEPACOL) lozenge 3 mg, 1 lozenge, Oral, Q2H PRN, Sloan Leiter, MD .  metroNIDAZOLE (FLAGYL) tablet 500 mg, 500 mg, Oral, Q12H, Sloan Leiter, MD, 500 mg at 07/01/18 2214 .  nalbuphine (NUBAIN) injection 5 mg, 5 mg, Intravenous, Q4H PRN **OR** nalbuphine (NUBAIN) injection 5 mg, 5 mg, Subcutaneous, Q4H PRN, Lidia Collum, MD .  nalbuphine (NUBAIN) injection 5 mg, 5 mg, Intravenous, Once PRN **OR** nalbuphine (NUBAIN) injection 5 mg, 5 mg, Subcutaneous, Once PRN, Lidia Collum, MD .  naloxone [facility]) injection 0.4 mg, 0.4 mg, Intravenous, PRN **AND** sodium chloride flush (NS) 0.9 % injection 3 mL, 3 mL, Intravenous, PRN, Lidia Collum, MD .  naloxone HCl (NARCAN) 2 mg in dextrose 5 % 250 mL infusion, 1-4 mcg/kg/hr, Intravenous, Continuous PRN, Lidia Collum, MD .  ondansetron (ZOFRAN) injection 4 mg, 4  mg, Intravenous, Q8H PRN, Lidia Collum, MD .  oxyCODONE (Oxy IR/ROXICODONE) immediate release tablet 10 mg, 10 mg, Oral, Q4H PRN, Sloan Leiter, MD .  oxyCODONE (Oxy IR/ROXICODONE) immediate release tablet 5 mg, 5 mg, Oral, Q4H PRN, Sloan Leiter, MD, 5 mg at 07/01/18 2214 .  oxytocin (PITOCIN) IV infusion 40 units in LR 1000 mL -  Premix, 2.5 Units/hr, Intravenous, Continuous, Sloan Leiter, MD, Last Rate: 62.5 mL/hr at 07/02/18 0435, 2.5 Units/hr at 07/02/18 0435 .  prenatal multivitamin tablet 1 tablet, 1 tablet, Oral, Q1200, Sloan Leiter, MD .  scopolamine (TRANSDERM-SCOP) 1 MG/3DAYS 1.5 mg, 1 patch, Transdermal, Once, Witman, Burnis Medin, MD .  senna-docusate (Senokot-S) tablet 2 tablet, 2 tablet, Oral, Q24H, Sloan Leiter, MD, 2 tablet at 07/01/18 2339 .  simethicone (MYLICON) chewable tablet 80 mg, 80 mg, Oral, TID PC, Sloan Leiter, MD .  simethicone [facility]) chewable tablet 80 mg, 80 mg, Oral, Q24H, Sloan Leiter, MD, 80 mg at 07/01/18 2339 .  simethicone (MYLICON) chewable tablet 80 mg, 80 mg, Oral, PRN, Sloan Leiter, MD .  Tdap [facility]) injection 0.5 mL, 0.5 mL, Intramuscular, Once, Sloan Leiter, MD .  zolpidem [facility]) tablet 5 mg, 5 mg, Oral, QHS PRN, Sloan Leiter, MD Recent Labs    07/01/18 [phone] 07/02/18 0539  HGB 11.4* 10.3*  HCT 33.3* 29.1*    Assessment/Plan:  Patient is 32 y.o. [id number] POD#1 s/p RCS at 106w0d for chronic hypertension with superimposed pre-eclampsia with severe features, now on MgSO4. She is doing well, recovering appropriately and complains only of mild pain.   Cont MgSO4 until ~ 6 pm Cont labetalol 200 mg BID Continue routine post partum care Pain meds prn Regular diet undecided for birth control TSH pending Cont synthroid 225 mcg for now   K. Arvilla Meres, M.D. Attending Center for [employer] (Faculty Practice)  07/02/2018, 6:36 AM

## 2018-07-03 LAB — T4, FREE: Free T4: 0.85 ng/dL (ref 0.82–1.77)

## 2018-07-03 LAB — RPR: RPR: NONREACTIVE

## 2018-07-03 MED ORDER — ENALAPRIL MALEATE 10 MG PO TABS
10.0000 mg | ORAL_TABLET | Freq: Two times a day (BID) | ORAL | Status: DC
  Administered 2018-07-03 – 2018-07-04 (×4): 10 mg via ORAL
  Filled 2018-07-03 (×5): qty 1

## 2018-07-03 MED ORDER — AMLODIPINE BESYLATE 5 MG PO TABS: 5.0000 mg | ORAL_TABLET | Freq: Every day | ORAL | Status: DC

## 2018-07-03 MED ORDER — AMLODIPINE BESYLATE 10 MG PO TABS: 10.0000 mg | ORAL_TABLET | Freq: Every day | ORAL | Status: DC

## 2018-07-03 MED ORDER — LABETALOL HCL 200 MG PO TABS
200.0000 mg | ORAL_TABLET | Freq: Once | ORAL | Status: AC
  Administered 2018-07-03: 200 mg via ORAL
  Filled 2018-07-03: qty 1

## 2018-07-03 NOTE — Progress Notes (Signed)
Faculty Attending Note  Post Op Day 2  Subjective: Patient is feeling well. She reports moderately well controlled pain on PO pain meds. She denies any headaches, visual symptoms, RUQ/epigastric pain.  Ambulating and denies light-headedness or dizziness. She is voiding spontaneously. She is not passing flatus, has not had a BM yet. She is tolerating clears without nausea/vomiting. Bleeding is moderate. She is breast & bottle feeding. Baby is at bedside and doing well.  Objective: Blood pressure (!) 176/104, pulse 90, temperature 98.3 F (36.8 C), temperature source Oral, resp. rate 18, weight 117.9 kg, last menstrual period 10/08/2017, SpO2 100 %, unknown if currently breastfeeding. Temp:  [97.6 F (36.4 C)-98.4 F (36.9 C)] 98.3 F (36.8 C) (11/30 0747) Pulse Rate:  [73-96] 90 (11/30 0800) Resp:  [16-18] 18 (11/30 0747) BP: (117-176)/(72-114) 176/104 (11/30 0800) SpO2:  [99 %-100 %] 100 % (11/30 0747)  Patient Vitals for the past 24 hrs:  BP Temp Temp src Pulse Resp SpO2  07/03/18 0800 (!) 176/104 - - 90 - -  07/03/18 0747 (!) 175/114 98.3 F (36.8 C) Oral 90 18 100 %  07/03/18 0334 119/72 97.6 F (36.4 C) - 73 16 99 %  07/02/18 2347 125/79 97.9 F (36.6 C) - 84 17 100 %  07/02/18 1933 123/82 98.4 F (36.9 C) Oral 84 17 99 %  07/02/18 1616 117/84 98.3 F (36.8 C) Oral 96 18 100 %  07/02/18 1139 138/90 98 F (36.7 C) Oral 87 18 100 %    Physical Exam:  General: alert, oriented, cooperative Chest: CTAB, normal respiratory effort Heart: RRR  Abdomen: +BS, soft, appropriately tender to palpation, incision covered by dressing with no evidence of active bleeding, Prevena in place  Uterine Fundus: firm, 2 fingers below the umbilicus Lochia: moderate, rubra Extremities: No evidence of DVT, no edema, no calf tenderness, 2+DTRs   Current Facility-Administered Medications:  .  acetaminophen (TYLENOL) tablet 650 mg, 650 mg, Oral, Q4H PRN, Sloan Leiter, MD .  coconut oil, 1  application, Topical, PRN, Sloan Leiter, MD .  witch hazel-glycerin (TUCKS) pad 1 application, 1 application, Topical, PRN **AND** dibucaine (NUPERCAINAL) 1 % rectal ointment 1 application, 1 application, Rectal, PRN, Sloan Leiter, MD .  diphenhydrAMINE (BENADRYL) injection 12.5 mg, 12.5 mg, Intravenous, Q4H PRN **OR** diphenhydrAMINE (BENADRYL) capsule 25 mg, 25 mg, Oral, Q4H PRN, Lidia Collum, MD .  diphenhydrAMINE (BENADRYL) capsule 25 mg, 25 mg, Oral, Q6H PRN, Sloan Leiter, MD .  enalapril (VASOTEC) tablet 10 mg, 10 mg, Oral, BID, Monita Swier A, MD .  enoxaparin (LOVENOX) injection 60 mg, 60 mg, Subcutaneous, Q24H, Sloan Leiter, MD, 60 mg at 07/02/18 0923 .  ibuprofen (ADVIL,MOTRIN) tablet 600 mg, 600 mg, Oral, Q6H, Sloan Leiter, MD, 600 mg at 07/03/18 0506 .  lactated ringers infusion, , Intravenous, Continuous, Sloan Leiter, MD, Stopped at 07/02/18 1711 .  levothyroxine (SYNTHROID, LEVOTHROID) tablet 225 mcg, 225 mcg, Oral, Q0600, Sloan Leiter, MD, 225 mcg at 07/03/18 0506 .  magnesium sulfate 40 grams in LR 500 mL OB infusion, 2 g/hr, Intravenous, Continuous, Sloan Leiter, MD, Stopped at 07/02/18 1711 .  menthol-cetylpyridinium (CEPACOL) lozenge 3 mg, 1 lozenge, Oral, Q2H PRN, Sloan Leiter, MD .  metroNIDAZOLE (FLAGYL) tablet 500 mg, 500 mg, Oral, Q12H, Sloan Leiter, MD, 500 mg at 07/02/18 2145 .  nalbuphine (NUBAIN) injection 5 mg, 5 mg, Intravenous, Q4H PRN **OR** nalbuphine (NUBAIN) injection 5 mg, 5 mg, Subcutaneous, Q4H PRN, Witman,  Burnis Medin, MD .  nalbuphine (NUBAIN) injection 5 mg, 5 mg, Intravenous, Once PRN **OR** nalbuphine (NUBAIN) injection 5 mg, 5 mg, Subcutaneous, Once PRN, Christella Hartigan, Burnis Medin, MD .  naloxone [facility]) injection 0.4 mg, 0.4 mg, Intravenous, PRN **AND** sodium chloride flush (NS) 0.9 % injection 3 mL, 3 mL, Intravenous, PRN, Lidia Collum, MD, 3 mL at 07/03/18 0006 .  naloxone HCl (NARCAN) 2 mg in dextrose 5 % 250 mL infusion, 1-4  mcg/kg/hr, Intravenous, Continuous PRN, Lidia Collum, MD .  ondansetron (ZOFRAN) injection 4 mg, 4 mg, Intravenous, Q8H PRN, Lidia Collum, MD .  oxyCODONE (Oxy IR/ROXICODONE) immediate release tablet 10 mg, 10 mg, Oral, Q4H PRN, Sloan Leiter, MD, 10 mg at 07/03/18 0506 .  oxyCODONE (Oxy IR/ROXICODONE) immediate release tablet 5 mg, 5 mg, Oral, Q4H PRN, Sloan Leiter, MD, 5 mg at 07/02/18 2057 .  prenatal multivitamin tablet 1 tablet, 1 tablet, Oral, Q1200, Sloan Leiter, MD .  scopolamine (TRANSDERM-SCOP) 1 MG/3DAYS 1.5 mg, 1 patch, Transdermal, Once, Witman, Burnis Medin, MD .  senna-docusate (Senokot-S) tablet 2 tablet, 2 tablet, Oral, Q24H, Sloan Leiter, MD, 2 tablet at 07/03/18 0006 .  simethicone (MYLICON) chewable tablet 80 mg, 80 mg, Oral, TID PC, Sloan Leiter, MD, 80 mg at 07/03/18 0810 .  simethicone (MYLICON) chewable tablet 80 mg, 80 mg, Oral, Q24H, Sloan Leiter, MD, 80 mg at 07/03/18 0007 .  simethicone (MYLICON) chewable tablet 80 mg, 80 mg, Oral, PRN, Sloan Leiter, MD .  Tdap [facility]) injection 0.5 mL, 0.5 mL, Intramuscular, Once, Sloan Leiter, MD .  zolpidem [facility]) tablet 5 mg, 5 mg, Oral, QHS PRN, Sloan Leiter, MD  Recent Labs    07/01/18 [id number] 07/02/18 0539  HGB 11.4* 10.3*  HCT 33.3* 29.1*   CMP Latest Ref Rng & Units 07/02/2018 07/01/2018 06/07/2018  Glucose 70 - 99 mg/dL 95 97 156(H)  BUN 6 - 20 mg/dL 8 9 7   Creatinine 0.44 - 1.00 mg/dL 0.53 0.63 0.70  Sodium 135 - 145 mmol/L 132(L) 136 135  Potassium 3.5 - 5.1 mmol/L 3.8 3.4(L) 3.0(L)  Chloride 98 - 111 mmol/L 104 109 107  CO2 22 - 32 mmol/L 21(L) 19(L) 20(L)  Calcium 8.9 - 10.3 mg/dL 7.4(L) 8.6(L) 8.4(L)  Total Protein 6.5 - 8.1 g/dL 5.7(L) 6.5 6.5  Total Bilirubin 0.3 - 1.2 mg/dL 0.6 0.8 1.0  Alkaline Phos 38 - 126 U/L 76 88 62  AST 15 - 41 U/L 22 23 19   ALT 0 - 44 U/L 14 16 15     Assessment/Plan: Patient is 32 y.o. [id number] POD#2 s/p RCS at [redacted]w[redacted]d for chronic hypertension with  superimposed severe pre-eclampsia, s/p MgSO4. She is doing well, recovering appropriately and complains only of mild pain.   Labetalol 200 mg po given x 1 now, reevaluate BP in one hour. Ordered to start Enalapril 10 mg bid. Pain meds prn Regular diet Undecided for birth control TSH 11.4, free T4 and T3 pending. May need to increase dose of Synthroid from 225 mcg daily Continue routine post partum care   Verita Schneiders, MD, [place], [facility] for [employer], [place]

## 2018-07-03 NOTE — Progress Notes (Signed)
CSW acknowledges consult.  CSW attempted to meet with [name], however [name] had several room guest and [name]'s bedside nurse was attending to family.  CSW will attempt to visit with [name] at a later time.   Laurey Arrow, MSW, LCSW Clinical Social Work [phone]

## 2018-07-04 ENCOUNTER — Other Ambulatory Visit: Payer: Self-pay

## 2018-07-04 LAB — T3, FREE: T3, Free: 1.9 pg/mL — ABNORMAL LOW (ref 2.0–4.4)

## 2018-07-04 MED ORDER — LABETALOL HCL 200 MG PO TABS
200.0000 mg | ORAL_TABLET | Freq: Two times a day (BID) | ORAL | Status: DC
  Administered 2018-07-05: 200 mg via ORAL
  Filled 2018-07-04: qty 1

## 2018-07-04 MED ORDER — HYDRALAZINE HCL 20 MG/ML IJ SOLN: 10.0000 mg | Freq: Once | INTRAMUSCULAR | Status: DC

## 2018-07-04 MED ORDER — HYDRALAZINE HCL 20 MG/ML IJ SOLN
10.0000 mg | INTRAMUSCULAR | Status: DC | PRN
  Administered 2018-07-04: 10 mg via INTRAVENOUS
  Filled 2018-07-04: qty 1

## 2018-07-04 MED ORDER — LABETALOL HCL 200 MG PO TABS
200.0000 mg | ORAL_TABLET | Freq: Two times a day (BID) | ORAL | Status: DC
  Administered 2018-07-04 (×2): 200 mg via ORAL
  Filled 2018-07-04 (×2): qty 1

## 2018-07-04 MED ORDER — FUROSEMIDE 20 MG PO TABS
20.0000 mg | ORAL_TABLET | Freq: Two times a day (BID) | ORAL | Status: AC
  Administered 2018-07-04 (×2): 20 mg via ORAL
  Filled 2018-07-04 (×2): qty 1

## 2018-07-04 MED ORDER — LEVOTHYROXINE SODIUM 125 MCG PO TABS
250.0000 ug | ORAL_TABLET | Freq: Every day | ORAL | Status: DC
  Administered 2018-07-05: 250 ug via ORAL
  Filled 2018-07-04: qty 2

## 2018-07-04 MED ORDER — POTASSIUM CHLORIDE CRYS ER 20 MEQ PO TBCR
40.0000 meq | EXTENDED_RELEASE_TABLET | Freq: Every day | ORAL | Status: DC
  Filled 2018-07-04 (×2): qty 2

## 2018-07-04 NOTE — Progress Notes (Signed)
Faculty Attending Note  Post Op Day 3  Subjective: Patient is feeling okay. She reports moderately well controlled pain on PO pain meds. She is ambulating and denies light-headedness or dizziness. She is voiding spontaneously. She is passing flatus, has not had a BM yet. She is tolerating a regular diet without nausea/vomiting. Bleeding is minimal. She is breast & bottle feeding. Baby is in nursery and doing well.  Objective: Blood pressure (!) 154/96, pulse 78, temperature 98 F (36.7 C), temperature source Axillary, resp. rate 20, weight 117.9 kg, last menstrual period 10/08/2017, SpO2 100 %, unknown if currently breastfeeding. Temp:  [97.5 F (36.4 C)-98.3 F (36.8 C)] 98 F (36.7 C) (12/01 0453) Pulse Rate:  [72-90] 78 (12/01 0452) Resp:  [18-20] 20 (12/01 0452) BP: (138-176)/(86-114) 154/96 (12/01 0452) SpO2:  [96 %-100 %] 100 % (12/01 0452)  Physical Exam:  General: alert, oriented, cooperative Chest: CTAB, normal respiratory effort Heart: RRR  Abdomen: soft, appropriately tender to palpation, incision covered by Martin Majestic with no evidence of active bleeding however suction not working, will replace today, incision appears dry/intact  Uterine Fundus: firm, 2 fingers below the umbilicus Lochia: moderate, rubra DVT Evaluation: no evidence of DVT Extremities: mild edema, no calf tenderness  UOP: voiding spontaneously   Current Facility-Administered Medications:  .  acetaminophen (TYLENOL) tablet 650 mg, 650 mg, Oral, Q4H PRN, Sloan Leiter, MD .  coconut oil, 1 application, Topical, PRN, Sloan Leiter, MD .  witch hazel-glycerin (TUCKS) pad 1 application, 1 application, Topical, PRN **AND** dibucaine (NUPERCAINAL) 1 % rectal ointment 1 application, 1 application, Rectal, PRN, Sloan Leiter, MD .  diphenhydrAMINE (BENADRYL) injection 12.5 mg, 12.5 mg, Intravenous, Q4H PRN **OR** diphenhydrAMINE (BENADRYL) capsule 25 mg, 25 mg, Oral, Q4H PRN, Lidia Collum, MD .   diphenhydrAMINE (BENADRYL) capsule 25 mg, 25 mg, Oral, Q6H PRN, Sloan Leiter, MD .  enalapril (VASOTEC) tablet 10 mg, 10 mg, Oral, BID, Anyanwu, Ugonna A, MD, 10 mg at 07/03/18 2200 .  enoxaparin (LOVENOX) injection 60 mg, 60 mg, Subcutaneous, Q24H, Sloan Leiter, MD, 60 mg at 07/03/18 0859 .  ibuprofen (ADVIL,MOTRIN) tablet 600 mg, 600 mg, Oral, Q6H, Sloan Leiter, MD, 600 mg at 07/04/18 0501 .  lactated ringers infusion, , Intravenous, Continuous, Sloan Leiter, MD, Stopped at 07/02/18 1711 .  levothyroxine (SYNTHROID, LEVOTHROID) tablet 225 mcg, 225 mcg, Oral, Q0600, Sloan Leiter, MD, 225 mcg at 07/04/18 0617 .  magnesium sulfate 40 grams in LR 500 mL OB infusion, 2 g/hr, Intravenous, Continuous, Sloan Leiter, MD, Stopped at 07/02/18 1711 .  menthol-cetylpyridinium (CEPACOL) lozenge 3 mg, 1 lozenge, Oral, Q2H PRN, Sloan Leiter, MD .  metroNIDAZOLE (FLAGYL) tablet 500 mg, 500 mg, Oral, Q12H, Sloan Leiter, MD, 500 mg at 07/03/18 2201 .  nalbuphine (NUBAIN) injection 5 mg, 5 mg, Intravenous, Q4H PRN **OR** nalbuphine (NUBAIN) injection 5 mg, 5 mg, Subcutaneous, Q4H PRN, Lidia Collum, MD .  nalbuphine (NUBAIN) injection 5 mg, 5 mg, Intravenous, Once PRN **OR** nalbuphine (NUBAIN) injection 5 mg, 5 mg, Subcutaneous, Once PRN, Lidia Collum, MD .  naloxone [facility]) injection 0.4 mg, 0.4 mg, Intravenous, PRN **AND** sodium chloride flush (NS) 0.9 % injection 3 mL, 3 mL, Intravenous, PRN, Lidia Collum, MD, 3 mL at 07/03/18 0006 .  naloxone HCl (NARCAN) 2 mg in dextrose 5 % 250 mL infusion, 1-4 mcg/kg/hr, Intravenous, Continuous PRN, Lidia Collum, MD .  ondansetron (ZOFRAN) injection 4 mg, 4 mg, Intravenous,  Q8H PRN, Lidia Collum, MD .  oxyCODONE (Oxy IR/ROXICODONE) immediate release tablet 10 mg, 10 mg, Oral, Q4H PRN, Sloan Leiter, MD, 10 mg at 07/04/18 0455 .  oxyCODONE (Oxy IR/ROXICODONE) immediate release tablet 5 mg, 5 mg, Oral, Q4H PRN, Sloan Leiter, MD, 5 mg  at 07/02/18 2057 .  prenatal multivitamin tablet 1 tablet, 1 tablet, Oral, Q1200, Sloan Leiter, MD .  scopolamine (TRANSDERM-SCOP) 1 MG/3DAYS 1.5 mg, 1 patch, Transdermal, Once, Witman, Burnis Medin, MD .  senna-docusate (Senokot-S) tablet 2 tablet, 2 tablet, Oral, Q24H, Sloan Leiter, MD, 2 tablet at 07/03/18 2326 .  simethicone (MYLICON) chewable tablet 80 mg, 80 mg, Oral, TID PC, Sloan Leiter, MD, 80 mg at 07/03/18 1754 .  simethicone (MYLICON) chewable tablet 80 mg, 80 mg, Oral, Q24H, Sloan Leiter, MD, 80 mg at 07/03/18 2326 .  simethicone (MYLICON) chewable tablet 80 mg, 80 mg, Oral, PRN, Sloan Leiter, MD .  Tdap [facility]) injection 0.5 mL, 0.5 mL, Intramuscular, Once, Sloan Leiter, MD .  zolpidem [facility]) tablet 5 mg, 5 mg, Oral, QHS PRN, Sloan Leiter, MD Recent Labs    07/01/18 [id number] 07/02/18 0539  HGB 11.4* 10.3*  HCT 33.3* 29.1*    Assessment/Plan:  Patient is 33 y.o. [id number] POD#3 s/p RCS at [redacted]w[redacted]d for chronic hypertension with superimposed preeclampsia with severe features, s/p MgSO4. She is doing okay, still with some abdominal pain. Will replace Pravena today. Severe range BP yesterday improved with PO labetalol and enalapril 10 mg BID, however they have not been well controlled and she is hovering just below severe range. Will see how am BP goes and add agent as necessary.   Continue routine post partum care Enalapril 10 mg BID Pain meds prn Regular diet undecided for birth control T4 wnl, T3 still pending Plan for discharge likely tomorrow   K. Arvilla Meres, M.D. Attending Center for [employer] (Faculty Practice)  07/04/2018, 6:38 AM

## 2018-07-04 NOTE — Progress Notes (Signed)
Faculty Practice OB/GYN Attending Note  Patient requesting discharge, but just had elevated BP.  Patient Vitals for the past 24 hrs:  BP Temp Temp src Pulse Resp SpO2 Height Weight  07/04/18 1138 (!) 178/101 98.2 F (36.8 C) Oral 70 18 98 % - -  07/04/18 1041 (!) 156/90 - - - - - - -  07/04/18 0852 - - - - - - 5\' 2"  (1.575 m) 117.9 kg  07/04/18 0807 (!) 149/94 - - 88 - - - -  07/04/18 0751 (!) 157/100 98.3 F (36.8 C) Oral 80 18 100 % - -  07/04/18 0453 - 98 F (36.7 C) Axillary - - - - -  07/04/18 0452 (!) 154/96 (!) 97.5 F (36.4 C) Oral 78 20 100 % - -  07/03/18 2320 (!) 155/89 97.9 F (36.6 C) Oral 80 20 100 % - -  07/03/18 1935 (!) 145/86 98.2 F (36.8 C) Oral 77 20 100 % - -  07/03/18 1530 (!) 145/96 98 F (36.7 C) Oral 72 18 100 % - -   She responds well to Labetalol 200 mg po bid, this was added back to regimen. Will still continue Enalapril 10 mg bid.  Getting Lasix 20 mg po bid for diuresis too; I added prophylactic K-Dur 40 mEq daily. I told patient that we need to watch her for at least one more day given severe range BP, she agreed. She denies any headaches, visual symptoms, RUQ/epigastric pain.   Will continue to monitor closely.  Verita Schneiders, MD, [place] for [employer], [place]

## 2018-07-04 NOTE — Progress Notes (Signed)
   07/04/18 1321  Vital Signs  BP (!) 164/95 (15 minutes after Hydralazine 10mg  IV)  Dr. Nolen Mu notified and orders received to continue to monitor.

## 2018-07-04 NOTE — Progress Notes (Signed)
Baby discharged home with maternal grandmother Octavio Manns).  ID verified with [place] drivers license.  Lauren Rafeek pnrp also verified ID with [place] drivers license.  Baby sent home with grandmother @ mothers request d.t her not being discharged and needing to rest d.t increased blood pressures.

## 2018-07-04 NOTE — Clinical Social Work Maternal (Addendum)
CLINICAL SOCIAL WORK MATERNAL/CHILD NOTE  Patient Details  Name: [name] MRN: [id number] Date of Birth: 08/08/1985  Date:  07/04/2018  Clinical Social Worker Initiating Note:  Whitney Ealey LCSW  Date/Time: Initiated:  07/04/18/1400     Child's Name:  [name]    Biological Parents:  Mother, Father   Need for Interpreter:  None   Reason for Referral:  Current Substance Use/Substance Use During Pregnancy    Address:  [address] [place] [address]    Phone number:  [phone] (home)     Additional phone number: N/A   Household Members/Support Persons (HM/SP):   Household Member/Support Person 1, Household Member/Support Person 2   HM/SP Name Relationship DOB or Age  HM/SP -1 [name]  Boyfriend     HM/SP -2   Daughter  13  HM/SP -3        HM/SP -4        HM/SP -5        HM/SP -6        HM/SP -7        HM/SP -8          Natural Supports (not living in the home):  Immediate Family, Parent   Professional Supports:     Employment: Unemployed   Type of Work:     Education:      Homebound arranged:    Financial Resources:  Medicaid   Other Resources:      Cultural/Religious Considerations Which May Impact Care:  N/A  Strengths:  Ability to meet basic needs , Home prepared for child , Compliance with medical plan , Pediatrician chosen   Psychotropic Medications:         Pediatrician:    [place] (including [place] and surronding areas)  Pediatrician List:   [place]    [place]    [place]    [place]    [place] [facility]  [place]      Pediatrician Fax Number:    Risk Factors/Current Problems:  None   Cognitive State:      Mood/Affect:  Calm , Relaxed    CSW Assessment: CSW met with [name] via bedside to provide any supports needed and notify her of CPS report being completed due to positive drug screens during pregnancy. [name] had many family members present  including [name] and requested [name] be present during conversation. [name] was pleasant and appropriate during conversation. [name] currently lives with FOB (boyfriend) and her 13 year old daughter. [name] states she has plenty of supports including her mother who lives in  and sister who lives in [place]. [name] is currently unemployed and was not able to work during her pregnancy due to her high BP. 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 [name] currently has Medicaid and is hoping to speak with the WIC representative tomorrow, 12/2, before she discharges.   CSW informed [name] of positive UDS on 10/15 (THC/barbiturates) and 11/1 (THC). [name] states she was aware of positive drug screens because her physician informed her. [name] states she only used marijuana before she found out she was pregnant and then stopped. CSW questioned if [name] was taking any barbiturates during pregnancy- [name] stated "I used CBD a few times to stop smoking so I think that's what made it positive". CSW informed [name] that usually would not show up positive for barbiturates- [name] voiced not taking   anything else. CSW completed CPS report due to multiple drug screens being positive during pregnancy. [name] voiced understanding and had no questions.   CSW provided [name] with information on baby blues vs PMADS and encouraged [name] to monitor her emotions during postpartum. [name] voiced no current problems at this time.   Update: CSW received call back from [name], on call CPS worker, who stated CPS report had been screened out. CSW department will continue to monitor the CDS.   CSW Plan/Description:  CSW Will Continue to Monitor Umbilical Cord Tissue Drug Screen Results and Make Report if Warranted, Child Protective Service Report     [name] Whitney Vera Furniss, LCSW 07/04/2018, 2:35 PM  

## 2018-07-05 ENCOUNTER — Telehealth: Payer: Self-pay | Admitting: Obstetrics & Gynecology

## 2018-07-05 ENCOUNTER — Encounter: Payer: Medicaid Other | Admitting: Obstetrics & Gynecology

## 2018-07-05 ENCOUNTER — Other Ambulatory Visit: Payer: Medicaid Other

## 2018-07-05 LAB — COMPREHENSIVE METABOLIC PANEL
ALT: 31 U/L (ref 0–44)
AST: 44 U/L — ABNORMAL HIGH (ref 15–41)
Albumin: 2.6 g/dL — ABNORMAL LOW (ref 3.5–5.0)
Alkaline Phosphatase: 57 U/L (ref 38–126)
Anion gap: 8 (ref 5–15)
BUN: 14 mg/dL (ref 6–20)
CHLORIDE: 109 mmol/L (ref 98–111)
CO2: 22 mmol/L (ref 22–32)
Calcium: 8.5 mg/dL — ABNORMAL LOW (ref 8.9–10.3)
Creatinine, Ser: 0.85 mg/dL (ref 0.44–1.00)
GFR calc Af Amer: >60 mL/min (ref >60)
Glucose, Bld: 97 mg/dL (ref 70–99)
Potassium: 4 mmol/L (ref 3.5–5.1)
Sodium: 139 mmol/L (ref 135–145)
Total Bilirubin: 0.5 mg/dL (ref 0.3–1.2)
Total Protein: 5.6 g/dL — ABNORMAL LOW (ref 6.5–8.1)

## 2018-07-05 LAB — CBC
HCT: 28.7 % — ABNORMAL LOW (ref 36.0–46.0)
Hemoglobin: 9.6 g/dL — ABNORMAL LOW (ref 12.0–15.0)
MCH: 31.7 pg (ref 26.0–34.0)
MCHC: 33.4 g/dL (ref 30.0–36.0)
MCV: 94.7 fL (ref 80.0–100.0)
NRBC: 0 % (ref 0.0–0.2)
Platelets: 251 10*3/uL (ref 150–400)
RBC: 3.03 MIL/uL — ABNORMAL LOW (ref 3.87–5.11)
RDW: 13.8 % (ref 11.5–15.5)
WBC: 7.3 10*3/uL (ref 4.0–10.5)

## 2018-07-05 MED ORDER — ENALAPRIL MALEATE 10 MG PO TABS: 10.0000 mg | ORAL_TABLET | Freq: Two times a day (BID) | ORAL | 2 refills | Status: DC

## 2018-07-05 MED ORDER — LEVOTHYROXINE SODIUM 125 MCG PO TABS: 250.0000 ug | ORAL_TABLET | Freq: Every day | ORAL | 2 refills | Status: DC

## 2018-07-05 MED ORDER — POTASSIUM CHLORIDE CRYS ER 20 MEQ PO TBCR: 40.0000 meq | EXTENDED_RELEASE_TABLET | Freq: Every day | ORAL | 0 refills | Status: DC

## 2018-07-05 MED ORDER — IBUPROFEN 600 MG PO TABS: 600.0000 mg | ORAL_TABLET | Freq: Four times a day (QID) | ORAL | 2 refills | Status: DC | PRN

## 2018-07-05 MED ORDER — FUROSEMIDE 40 MG PO TABS: 40.0000 mg | ORAL_TABLET | Freq: Every day | ORAL | 0 refills | Status: DC

## 2018-07-05 MED ORDER — OXYCODONE-ACETAMINOPHEN 5-325 MG PO TABS: 1.0000 | ORAL_TABLET | ORAL | 0 refills | Status: DC | PRN

## 2018-07-05 NOTE — Progress Notes (Signed)
Patient discharged home with her mother. Discharge teaching, home care, prescriptions, s/s of PIH, follow-up appts, and reasons to see care discussed. Pt verbalized understanding.

## 2018-07-05 NOTE — Discharge Instructions (Signed)
Cesarean Delivery, Care After Refer to this sheet in the next few weeks. These instructions provide you with information about caring for yourself after your procedure. Your health care provider may also give you more specific instructions. Your treatment has been planned according to current medical practices, but problems sometimes occur. Call your health care provider if you have any problems or questions after your procedure. What can I expect after the procedure? After the procedure, it is common to have:  A small amount of blood or clear fluid coming from the incision.  Some redness, swelling, and pain in your incision area.  Some abdominal pain and soreness.  Vaginal bleeding (lochia).  Pelvic cramps.  Fatigue.  Follow these instructions at home: Incision care   Follow instructions from your health care provider about how to take care of your incision. Make sure you: ? Wash your hands with soap and water before you change your bandage (dressing). If soap and water are not available, use hand sanitizer. ? If you have a dressing, change it as told by your health care provider. ? Leave stitches (sutures), skin staples, skin glue, or adhesive strips in place. These skin closures may need to stay in place for 2 weeks or longer. If adhesive strip edges start to loosen and curl up, you may trim the loose edges. Do not remove adhesive strips completely unless your health care provider tells you to do that.  Check your incision area every day for signs of infection. Check for: ? More redness, swelling, or pain. ? More fluid or blood. ? Warmth. ? Pus or a bad smell.  When you cough or sneeze, hug a pillow. This helps with pain and decreases the chance of your incision opening up (dehiscing). Do this until your incision heals. Medicines  Take over-the-counter and prescription medicines only as told by your health care provider.  If you were prescribed an antibiotic medicine, take  it as told by your health care provider. Do not stop taking the antibiotic until it is finished. Driving  Do not drive or operate heavy machinery while taking prescription pain medicine. Lifestyle  Do not drink alcohol. This is especially important if you are breastfeeding or taking pain medicine.  Do not use tobacco products, including cigarettes, chewing tobacco, or e-cigarettes. If you need help quitting, ask your health care provider. Tobacco can delay wound healing. Eating and drinking  Drink at least 8 eight-ounce glasses of water every day unless told not to by your health care provider. If you breastfeed, you may need to drink more water than this.  Eat high-fiber foods every day. These foods may help prevent or relieve constipation. High-fiber foods include: ? Whole grain cereals and breads. ? Brown rice. ? Beans. ? Fresh fruits and vegetables. Activity  Return to your normal activities as told by your health care provider. Ask your health care provider what activities are safe for you.  Rest as much as possible. Try to rest or take a nap while your baby is sleeping.  Do not lift anything that is heavier than your baby or 10 lb (4.5 kg) as told by your health care provider.  Ask your health care provider when you can engage in sexual activity. This may depend on your: ? Risk of infection. ? Healing rate. ? Comfort and desire to engage in sexual activity. Bathing  Do not take baths, swim, or use a hot tub until your health care provider approves. Ask your health care provider  if you can take showers. You may only be allowed to take sponge baths until your incision heals. General instructions  Do not use tampons or douches until your health care provider approves.  Wear: ? Loose, comfortable clothing. ? A supportive and well-fitting bra.  Watch for any blood clots that may pass from your vagina. These may look like clumps of dark red, brown, or black discharge.  Keep  your perineum clean and dry as told by your health care provider.  Wipe from front to back when you use the toilet.  If possible, have someone help you care for your baby and help with household activities for a few days after you leave the hospital.  Keep all follow-up visits for you and your baby as told by your health care provider. This is important. Contact a health care provider if:  You have: ? Bad-smelling vaginal discharge. ? Difficulty urinating. ? Pain when urinating. ? A sudden increase or decrease in the frequency of your bowel movements. ? More redness, swelling, or pain around your incision. ? More fluid or blood coming from your incision. ? Pus or a bad smell coming from your incision. ? A fever. ? A rash. ? Little or no interest in activities you used to enjoy. ? Questions about caring for yourself or your baby. ? Nausea.  Your incision feels warm to the touch.  Your breasts turn red or become painful or hard.  You feel unusually sad or worried.  You vomit.  You pass large blood clots from your vagina. If you pass a blood clot, save it to show to your health care provider. Do not flush blood clots down the toilet without showing your health care provider.  You urinate more than usual.  You are dizzy or light-headed.  You have not breastfed and have not had a menstrual period for 12 weeks after delivery.  You stopped breastfeeding and have not had a menstrual period for 12 weeks after stopping breastfeeding. Get help right away if:  You have: ? Pain that does not go away or get better with medicine. ? Chest pain. ? Difficulty breathing. ? Blurred vision or spots in your vision. ? Thoughts about hurting yourself or your baby. ? New pain in your abdomen or in one of your legs. ? A severe headache.  You faint.  You bleed from your vagina so much that you fill two sanitary pads in one hour. This information is not intended to replace advice given to  you by your health care provider. Make sure you discuss any questions you have with your health care provider. Document Released: 04/12/2002 Document Revised: 08/23/2016 Document Reviewed: 06/25/2015 Elsevier Interactive Patient Education  2018 [employer].   Postpartum Hypertension Postpartum hypertension is high blood pressure after pregnancy that remains higher than normal for more than two days after delivery. You may not realize that you have postpartum hypertension if your blood pressure is not being checked regularly. In some cases, postpartum hypertension will go away on its own, usually within a week of delivery. However, for some women, medical treatment is required to prevent serious complications, such as seizures or stroke. The following things can affect your blood pressure:  The type of delivery you had.  Having received IV fluids or other medicines during or after delivery.  What are the causes? Postpartum hypertension may be caused by any of the following or by a combination of any of the following:  Hypertension that existed before pregnancy (chronic  hypertension).  Gestational hypertension.  Preeclampsia or eclampsia.  Receiving a lot of fluid through an IV during or after delivery.  Medicines.  HELLP syndrome.  Hyperthyroidism.  Stroke.  Other rare neurological or blood disorders.  In some cases, the cause may not be known. What increases the risk? Postpartum hypertension can be related to one or more risk factors, such as:  Chronic hypertension. In some cases, this may not have been diagnosed before pregnancy.  Obesity.  Type 2 diabetes.  Kidney disease.  Family history of preeclampsia.  Other medical conditions that cause hormonal imbalances.  What are the signs or symptoms? As with all types of hypertension, postpartum hypertension may not have any symptoms. Depending on how high your blood pressure is, you may experience:  Headaches.  These may be mild, moderate, or severe. They may also be steady, constant, or sudden in onset (thunderclap headache).  Visual changes.  Dizziness.  Shortness of breath.  Swelling of your hands, feet, lower legs, or face. In some cases, you may have swelling in more than one of these locations.  Heart palpitations or a racing heartbeat.  Difficulty breathing while lying down.  Decreased urination.  Other rare signs and symptoms may include:  Sweating more than usual. This lasts longer than a few days after delivery.  Chest pain.  Sudden dizziness when you get up from sitting or lying down.  Seizures.  Nausea or vomiting.  Abdominal pain.  How is this diagnosed? The diagnosis of postpartum hypertension is made through a combination of physical examination findings and testing of your blood and urine. You may also have additional tests, such as a CT scan or an MRI, to check for other complications of postpartum hypertension. How is this treated? When blood pressure is high enough to require treatment, your options may include:  Medicines to reduce blood pressure (antihypertensives). Tell your health care provider if you are breastfeeding or if you plan to breastfeed. There are many antihypertensive medicines that are safe to take while breastfeeding.  Stopping medicines that may be causing hypertension.  Treating medical conditions that are causing hypertension.  Treating the complications of hypertension, such as seizures, stroke, or kidney problems.  Your health care provider will also continue to monitor your blood pressure closely and repeatedly until it is within a safe range for you. Follow these instructions at home:  Take medicines only as directed by your health care provider.  Get regular exercise after your health care provider tells you that it is safe.  Follow your health care providers recommendations on fluid and salt restrictions.  Do not use any  tobacco products, including cigarettes, chewing tobacco, or electronic cigarettes. If you need help quitting, ask your health care provider.  Keep all follow-up visits as directed by your health care provider. This is important. Contact a health care provider if:  Your symptoms get worse.  You have new symptoms, such as: ? Headache. ? Dizziness. ? Visual changes. Get help right away if:  You develop a severe or sudden headache.  You have seizures.  You develop numbness or weakness on one side of your body.  You have difficulty thinking, speaking, or swallowing.  You develop severe abdominal pain.  You develop difficulty breathing, chest pain, a racing heartbeat, or heart palpitations. These symptoms may represent a serious problem that is an emergency. Do not wait to see if the symptoms will go away. Get medical help right away. Call your local emergency services (911 in the  [place] Do not drive yourself to the hospital. This information is not intended to replace advice given to you by your health care provider. Make sure you discuss any questions you have with your health care provider. Document Released: 03/24/2014 Document Revised: 12/24/2015 Document Reviewed: 02/02/2014 Elsevier Interactive Patient Education  [employer].

## 2018-07-05 NOTE — Discharge Summary (Signed)
OB Discharge Summary     Patient Name: [name] DOB: 01/29/1986 MRN: [id number]  Date of admission: 07/01/2018 Delivering MD: Sloan Leiter   Date of discharge: 07/05/2018  Admitting diagnosis: 39wks- yeast infection Intrauterine pregnancy: [redacted]w[redacted]d    Secondary diagnosis:  Principal Problem:   Severe Pre-eclampsia superimposed on chronic hypertension, delivered Active Problems:   Chronic hypertension in pregnancy   History of cesarean delivery  Additional problems: None     Discharge diagnosis: Term Pregnancy Delivered, Preeclampsia (severe) and CHTN with superimposed preeclampsia                                                                                                Post partum procedures: magnesium sulfate  Complications: None  Hospital course: Cesarean Section  32y.o. yo [id number] admitted to the hospital 07/01/2018 [facility] , and delivered a Viable infant,07/01/2018  Membrane Rupture Time/Date: 5:11 PM ,07/01/2018   Details of operation can be found in separate operative Note.  Patient had a largely  uncomplicated postpartum course. Received magnesium sulfate for up to 24 hours postpartum. Started on Enalapril for BP control, had to add back Labetalol for optimal control. She is ambulating, tolerating a regular diet, passing flatus, and urinating well.  Patient is discharged home in stable condition on 07/05/18.                                    Physical exam  Vitals:   07/04/18 1916 07/04/18 2250 07/05/18 0509 07/05/18 0746  BP: (!) 155/79 (!) 149/97 (!) 143/82 (!) 156/86  Pulse: 76 83 68 80  Resp: _0 Temp: 98.2 F (36.8 C) 98.1 F (36.7 C) 98.3 F (36.8 C) 98.4 F (36.9 C)  TempSrc: Oral Oral Oral Oral  SpO2:  100% 100% 100%  Weight:      Height:       General: alert, cooperative and no distress Lochia: appropriate Uterine Fundus:  firm Incision: Prevena Dressing is clean, dry, and intact DVT Evaluation: No evidence of DVT seen on physical exam. Negative Homan's sign.2+ BLE edema.  Labs: Lab Results  Component Value Date   WBC 7.3 07/05/2018   HGB 9.6 (L) 07/05/2018   HCT 28.7 (L) 07/05/2018   MCV 94.7 07/05/2018   PLT 251 07/05/2018   CMP Latest Ref Rng & Units 07/05/2018  Glucose 70 - 99 mg/dL 97  BUN 6 - 20 mg/dL 14  Creatinine 0.44 - 1.00 mg/dL 0.85  Sodium 135 - 145 mmol/L 139  Potassium 3.5 - 5.1 mmol/L 4.0  Chloride 98 - 111 mmol/L 109  CO2 22 - 32 mmol/L 22  Calcium 8.9 - 10.3 mg/dL 8.5(L)  Total Protein 6.5 - 8.1 g/dL 5.6(L)  Total Bilirubin 0.3 - 1.2 mg/dL 0.5  Alkaline Phos 38 - 126 U/L 57  AST 15 - 41 U/L 44(H)  ALT 0 - 44 U/L 31    Discharge instruction:  per After Visit Summary and "Baby and Me Booklet".  After visit meds:  Allergies as of 07/05/2018      Reactions   Hydrocodone Nausea And Vomiting   Pt says she is not allergic, she stated allergy so the ED wouldn't medicate her.       Medication List    STOP taking these medications   famotidine 20 MG tablet Commonly known as:  PEPCID   NIFEdipine 60 MG 24 hr tablet Commonly known as:  PROCARDIA XL/NIFEDICAL XL     TAKE these medications   acetaminophen 500 MG tablet Commonly known as:  TYLENOL Take 1,000 mg by mouth every 8 (eight) hours as needed for headache.   Adult Blood Pressure Cuff Lg Kit 1 kit by Does not apply route every morning.   aspirin EC 81 MG tablet Take 1 tablet (81 mg total) by mouth daily.   docusate sodium 100 MG capsule Commonly known as:  COLACE Take 1 capsule (100 mg total) by mouth 2 (two) times daily as needed.   enalapril 10 MG tablet Commonly known as:  VASOTEC Take 1 tablet (10 mg total) by mouth 2 (two) times daily.   furosemide 40 MG tablet Commonly known as:  LASIX Take 1 tablet (40 mg total) by mouth daily.   ibuprofen 600 MG tablet Commonly known as:  ADVIL,MOTRIN Take 1  tablet (600 mg total) by mouth every 6 (six) hours as needed for moderate pain or cramping.   labetalol 200 MG tablet Commonly known as:  NORMODYNE Take 1 tablet (200 mg total) by mouth 2 (two) times daily.   levothyroxine 125 MCG tablet Commonly known as:  SYNTHROID, LEVOTHROID Take 2 tablets (250 mcg total) by mouth daily at 6 (six) AM. Start taking on:  07/06/2018 What changed:    medication strength  how much to take  when to take this  Another medication with the same name was removed. Continue taking this medication, and follow the directions you see here.   oxyCODONE-acetaminophen 5-325 MG tablet Commonly known as:  PERCOCET/ROXICET Take 1 tablet by mouth every 4 (four) hours as needed for severe pain.   potassium chloride SA 20 MEQ tablet Commonly known as:  K-DUR,KLOR-CON Take 2 tablets (40 mEq total) by mouth daily.            Discharge Care Instructions  (From admission, onward)         Start     Ordered   07/05/18 0000  Discharge wound care:    Comments:  As per discharge handout and nursing instructions   07/05/18 0802          Diet: routine diet  Activity: Advance as tolerated. Pelvic rest for 6 weeks.   Outpatient follow up:07/09/18 for BP check and Prevena removal  Postpartum contraception: Combination OCPs  Newborn Data: Live born female  Birth Weight: 5 lb 11.7 oz (2600 g) APGAR: 11, 9  Newborn Delivery   Birth date/time:  07/01/2018 17:12:00 Delivery type:  C-Section, Low Transverse Trial of labor:  No C-section categorization:  Repeat     Baby Feeding: Bottle and Breast Disposition:home with mother   07/05/2018 Verita Schneiders, MD

## 2018-07-05 NOTE — Telephone Encounter (Signed)
Tried to call patient to give her appointment information, but her mailbox is full.

## 2018-07-07 ENCOUNTER — Other Ambulatory Visit: Payer: Self-pay

## 2018-07-07 ENCOUNTER — Inpatient Hospital Stay (HOSPITAL_COMMUNITY)
Admission: AD | Admit: 2018-07-07 | Discharge: 2018-07-11 | DRG: 776 | Disposition: A | Discharge status: Home / Self Care | Payer: Medicaid Other | Attending: Obstetrics and Gynecology | Admitting: Obstetrics and Gynecology

## 2018-07-07 ENCOUNTER — Encounter (HOSPITAL_COMMUNITY): Payer: Self-pay | Admitting: *Deleted

## 2018-07-07 ENCOUNTER — Ambulatory Visit (INDEPENDENT_AMBULATORY_CARE_PROVIDER_SITE_OTHER): Payer: Medicaid Other | Admitting: *Deleted

## 2018-07-07 ENCOUNTER — Encounter (HOSPITAL_COMMUNITY)
Admission: RE | Admit: 2018-07-07 | Discharge: 2018-07-07 | Disposition: A | Discharge status: Home / Self Care | Payer: Medicaid Other | Source: Ambulatory Visit | Attending: Obstetrics & Gynecology | Admitting: Obstetrics & Gynecology

## 2018-07-07 VITALS — BP 218/118 | HR 74

## 2018-07-07 DIAGNOSIS — I10 Essential (primary) hypertension: Secondary | ICD-10-CM | POA: Diagnosis present

## 2018-07-07 DIAGNOSIS — O99285 Endocrine, nutritional and metabolic diseases complicating the puerperium: Secondary | ICD-10-CM | POA: Diagnosis present

## 2018-07-07 DIAGNOSIS — E039 Hypothyroidism, unspecified: Secondary | ICD-10-CM | POA: Diagnosis present

## 2018-07-07 DIAGNOSIS — O1003 Pre-existing essential hypertension complicating the puerperium: Secondary | ICD-10-CM | POA: Diagnosis present

## 2018-07-07 DIAGNOSIS — O115 Pre-existing hypertension with pre-eclampsia, complicating the puerperium: Principal | ICD-10-CM | POA: Diagnosis present

## 2018-07-07 DIAGNOSIS — O1495 Unspecified pre-eclampsia, complicating the puerperium: Secondary | ICD-10-CM | POA: Diagnosis present

## 2018-07-07 DIAGNOSIS — Z5189 Encounter for other specified aftercare: Secondary | ICD-10-CM

## 2018-07-07 DIAGNOSIS — O1490 Unspecified pre-eclampsia, unspecified trimester: Secondary | ICD-10-CM | POA: Diagnosis present

## 2018-07-07 LAB — PROTEIN / CREATININE RATIO, URINE
Creatinine, Urine: 143 mg/dL
Protein Creatinine Ratio: 0.07 mg/mg{Cre} (ref 0.00–0.15)
TOTAL PROTEIN, URINE: 10 mg/dL

## 2018-07-07 LAB — COMPREHENSIVE METABOLIC PANEL
ALK PHOS: 65 U/L (ref 38–126)
ALT: 37 U/L (ref 0–44)
AST: 57 U/L — ABNORMAL HIGH (ref 15–41)
Albumin: 3.3 g/dL — ABNORMAL LOW (ref 3.5–5.0)
Anion gap: 7 (ref 5–15)
BUN: 18 mg/dL (ref 6–20)
CO2: 25 mmol/L (ref 22–32)
Calcium: 8.8 mg/dL — ABNORMAL LOW (ref 8.9–10.3)
Chloride: 106 mmol/L (ref 98–111)
Creatinine, Ser: 0.84 mg/dL (ref 0.44–1.00)
GFR calc Af Amer: >60 mL/min (ref >60)
GFR calc non Af Amer: >60 mL/min (ref >60)
Glucose, Bld: 91 mg/dL (ref 70–99)
Potassium: 4.2 mmol/L (ref 3.5–5.1)
Sodium: 138 mmol/L (ref 135–145)
Total Bilirubin: 0.6 mg/dL (ref 0.3–1.2)
Total Protein: 6.6 g/dL (ref 6.5–8.1)

## 2018-07-07 LAB — CBC WITH DIFFERENTIAL/PLATELET
Basophils Absolute: 0 10*3/uL (ref 0.0–0.1)
Basophils Relative: 0 %
Eosinophils Absolute: 0.1 10*3/uL (ref 0.0–0.5)
Eosinophils Relative: 2 %
HCT: 32.4 % — ABNORMAL LOW (ref 36.0–46.0)
HEMOGLOBIN: 10.9 g/dL — AB (ref 12.0–15.0)
Lymphocytes Relative: 28 %
Lymphs Abs: 2.2 10*3/uL (ref 0.7–4.0)
MCH: 32 pg (ref 26.0–34.0)
MCHC: 33.6 g/dL (ref 30.0–36.0)
MCV: 95 fL (ref 80.0–100.0)
MONOS PCT: 3 %
Monocytes Absolute: 0.2 10*3/uL (ref 0.1–1.0)
Neutro Abs: 5.3 10*3/uL (ref 1.7–7.7)
Neutrophils Relative %: 67 %
Platelets: 317 10*3/uL (ref 150–400)
RBC: 3.41 MIL/uL — ABNORMAL LOW (ref 3.87–5.11)
RDW: 13 % (ref 11.5–15.5)
WBC: 7.8 10*3/uL (ref 4.0–10.5)
nRBC: 0 % (ref 0.0–0.2)

## 2018-07-07 MED ORDER — LABETALOL HCL 5 MG/ML IV SOLN: 40.0000 mg | INTRAVENOUS | Status: DC | PRN

## 2018-07-07 MED ORDER — FUROSEMIDE 40 MG PO TABS
40.0000 mg | ORAL_TABLET | Freq: Once | ORAL | Status: AC
  Administered 2018-07-07: 40 mg via ORAL
  Filled 2018-07-07: qty 1

## 2018-07-07 MED ORDER — LABETALOL HCL 5 MG/ML IV SOLN
80.0000 mg | INTRAVENOUS | Status: DC | PRN
  Administered 2018-07-07: 80 mg via INTRAVENOUS
  Filled 2018-07-07: qty 16

## 2018-07-07 MED ORDER — LACTATED RINGERS IV SOLN
INTRAVENOUS | Status: DC
  Administered 2018-07-07 – 2018-07-08 (×2): via INTRAVENOUS

## 2018-07-07 MED ORDER — MAGNESIUM SULFATE 40 G IN LACTATED RINGERS - SIMPLE
2.0000 g/h | INTRAVENOUS | Status: DC
  Filled 2018-07-07: qty 500

## 2018-07-07 MED ORDER — LABETALOL HCL 200 MG PO TABS
200.0000 mg | ORAL_TABLET | Freq: Two times a day (BID) | ORAL | Status: DC
  Administered 2018-07-07 – 2018-07-08 (×2): 200 mg via ORAL
  Filled 2018-07-07 (×2): qty 1

## 2018-07-07 MED ORDER — LABETALOL HCL 5 MG/ML IV SOLN
40.0000 mg | INTRAVENOUS | Status: DC | PRN
  Administered 2018-07-07: 40 mg via INTRAVENOUS
  Filled 2018-07-07: qty 8

## 2018-07-07 MED ORDER — HYDRALAZINE HCL 20 MG/ML IJ SOLN
10.0000 mg | INTRAMUSCULAR | Status: DC | PRN
  Administered 2018-07-07: 10 mg via INTRAVENOUS
  Filled 2018-07-07 (×2): qty 1

## 2018-07-07 MED ORDER — ENALAPRIL MALEATE 10 MG PO TABS
10.0000 mg | ORAL_TABLET | Freq: Once | ORAL | Status: AC
  Administered 2018-07-07: 10 mg via ORAL
  Filled 2018-07-07: qty 1

## 2018-07-07 MED ORDER — ENOXAPARIN SODIUM 40 MG/0.4ML ~~LOC~~ SOLN
40.0000 mg | SUBCUTANEOUS | Status: DC
  Administered 2018-07-07 – 2018-07-10 (×4): 40 mg via SUBCUTANEOUS
  Filled 2018-07-07 (×5): qty 0.4

## 2018-07-07 MED ORDER — HYDRALAZINE HCL 20 MG/ML IJ SOLN
5.0000 mg | INTRAMUSCULAR | Status: DC | PRN
  Administered 2018-07-09 – 2018-07-10 (×2): 5 mg via INTRAVENOUS
  Filled 2018-07-07 (×2): qty 1

## 2018-07-07 MED ORDER — ENOXAPARIN SODIUM 40 MG/0.4ML ~~LOC~~ SOLN
40.0000 mg | SUBCUTANEOUS | Status: DC
  Filled 2018-07-07: qty 0.4

## 2018-07-07 MED ORDER — ENALAPRIL MALEATE 10 MG PO TABS
10.0000 mg | ORAL_TABLET | Freq: Every day | ORAL | Status: DC
  Administered 2018-07-08 – 2018-07-09 (×2): 10 mg via ORAL
  Filled 2018-07-07 (×3): qty 1

## 2018-07-07 MED ORDER — CEPHALEXIN 500 MG PO CAPS
500.0000 mg | ORAL_CAPSULE | Freq: Four times a day (QID) | ORAL | Status: DC
  Administered 2018-07-07 – 2018-07-10 (×12): 500 mg via ORAL
  Filled 2018-07-07 (×12): qty 1

## 2018-07-07 MED ORDER — POTASSIUM CHLORIDE CRYS ER 20 MEQ PO TBCR
40.0000 meq | EXTENDED_RELEASE_TABLET | Freq: Once | ORAL | Status: AC
  Administered 2018-07-07: 40 meq via ORAL
  Filled 2018-07-07: qty 2

## 2018-07-07 MED ORDER — LABETALOL HCL 5 MG/ML IV SOLN: 20.0000 mg | INTRAVENOUS | Status: DC | PRN

## 2018-07-07 MED ORDER — MAGNESIUM SULFATE BOLUS VIA INFUSION
4.0000 g | Freq: Once | INTRAVENOUS | Status: AC
  Administered 2018-07-07: 4 g via INTRAVENOUS
  Filled 2018-07-07: qty 500

## 2018-07-07 MED ORDER — HYDRALAZINE HCL 20 MG/ML IJ SOLN
10.0000 mg | INTRAMUSCULAR | Status: DC | PRN
  Administered 2018-07-07: 10 mg via INTRAVENOUS

## 2018-07-07 MED ORDER — LABETALOL HCL 5 MG/ML IV SOLN
20.0000 mg | INTRAVENOUS | Status: DC | PRN
  Administered 2018-07-07: 20 mg via INTRAVENOUS
  Filled 2018-07-07: qty 4

## 2018-07-07 NOTE — MAU Note (Signed)
Sent up from clinic for elevated b/p, s/p c-section 11/28. Denies headache or blurred vision

## 2018-07-07 NOTE — Progress Notes (Signed)
Attempts by Reed Pandy, RNC

## 2018-07-07 NOTE — H&P (Signed)
Obstetric History and Physical  [name] is a 32 y.o. [id number] POD#6 s/p RCS for chronic hypertension with superimposed preeclampsia with severe features. She was discharged home POD#4 in good condition on anti-hypertensives. She presented to clinic today for "beeping" on the machine (patient had prevena place), noted at visit to have severe range BP and was sent to MAU for eval.  Pregnancy complications or risks: Patient Active Problem List   Diagnosis Date Noted  . Preeclampsia 07/07/2018  . Severe Pre-eclampsia superimposed on chronic hypertension, delivered 07/01/2018  . Substance abuse affecting pregnancy in third trimester, antepartum 05/18/2018  . Obesity, Class III, BMI 40-49.9 (morbid obesity) ([place]) 12/30/2017  . Obesity affecting pregnancy, antepartum 12/30/2017  . Hypothyroidism during pregnancy, antepartum 12/30/2017  . Supervision of high risk pregnancy, antepartum 12/16/2017  . Chronic hypertension in pregnancy 12/16/2017  . History of cesarean delivery 12/16/2017  . 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  . Hematuria 11/11/2017  . Hypertension   . 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 Thyroid cancer ([place]) 09/11/2016   Medical History:  Past Medical History:  Diagnosis Date  . Anxiety   . Cancer ([place]) 02/2012   thyroid cancer  . Hypertension   . Thyroid disease    cancer    Past Surgical History:  Procedure Laterality Date  . CESAREAN SECTION  2006  . CESAREAN SECTION N/A 07/01/2018   Procedure: CESAREAN SECTION;  Surgeon: Sloan Leiter, MD;  Location: [facility];  Service: Obstetrics;  Laterality: N/A;  . DILATION AND EVACUATION N/A 02/07/2015   Procedure: DILATATION AND EVACUATION;  Surgeon: Lavonia Drafts, MD;  Location: [facility];  Service: Gynecology;  Laterality: N/A;  . THYROIDECTOMY  02/2012   total L side first and R side 7 days later.    OB History  Gravida Para Term Preterm AB Living  _0 SAB TAB  Ectopic Multiple Live Births  1     0 2    # Outcome Date GA Lbr Len/2nd Weight Sex Delivery Anes PTL Lv  3 Term 07/01/18 [redacted]w[redacted]d 2600 g F CS-LTranv Spinal  LIV  2 SAB 2017             Birth Comments: had d & C  1 Term 10/02/04 [id number] F CS-Unspec   LIV     Birth Comments: c/s - not sure why- but thinks it was due to either baby hr or her hr    Social History   Socioeconomic History  . Marital status: Single    Spouse name: Not on file  . Number of children: Not on file  . Years of education: Not on file  . Highest education level: Not on file  Occupational History  . Not on file  Social Needs  . Financial resource strain: Not hard at all  . Food insecurity:    Worry: Never true    Inability: Never true  . Transportation needs:    Medical: Yes    Non-medical: Not on file  Tobacco Use  . Smoking status: Never Smoker  . Smokeless tobacco: Never Used  Substance and Sexual Activity  . Alcohol use: No  . Drug use: Yes    Types: Marijuana    Comment: last used 05/19/18  . Sexual activity: Yes    Birth control/protection: None  Lifestyle  . Physical activity:    Days per week: Not on file    Minutes per session: Not on file  .  Stress: Not at all  Relationships  . Social connections:    Talks on phone: Not on file    Gets together: Not on file    Attends religious service: Not on file    Active member of club or organization: Not on file    Attends meetings of clubs or organizations: Not on file    Relationship status: Not on file  Other Topics Concern  . Not on file  Social History Narrative  . Not on file    Family History  Problem Relation Age of Onset  . Hypertension Mother   . Blindness Mother        one eye    Medications Prior to Admission  Medication Sig Dispense Refill Last Dose  . acetaminophen (TYLENOL) 500 MG tablet Take 1,000 mg by mouth every 8 (eight) hours as needed for headache.   Past Week at Unknown time  . aspirin EC 81 MG tablet Take 1  tablet (81 mg total) by mouth daily. 30 tablet 1 07/06/2018 at Unknown time  . docusate sodium (COLACE) 100 MG capsule Take 1 capsule (100 mg total) by mouth 2 (two) times daily as needed. (Patient taking differently: Take 100 mg by mouth 2 (two) times daily as needed. ) 30 capsule 2 Past Week at Unknown time  . enalapril (VASOTEC) 10 MG tablet Take 1 tablet (10 mg total) by mouth 2 (two) times daily. 60 tablet 2 07/07/2018 at Unknown time  . furosemide (LASIX) 40 MG tablet Take 1 tablet (40 mg total) by mouth daily. 5 tablet 0 07/07/2018 at Unknown time  . ibuprofen (ADVIL,MOTRIN) 600 MG tablet Take 1 tablet (600 mg total) by mouth every 6 (six) hours as needed for moderate pain or cramping. 60 tablet 2 Past Week at Unknown time  . labetalol (NORMODYNE) 200 MG tablet Take 1 tablet (200 mg total) by mouth 2 (two) times daily. 60 tablet 3 07/07/2018 at Unknown time  . levothyroxine (SYNTHROID, LEVOTHROID) 125 MCG tablet Take 2 tablets (250 mcg total) by mouth daily at 6 (six) AM. (Patient taking differently: Take 125 mcg by mouth 2 (two) times daily. ) 60 tablet 2 07/07/2018 at Unknown time  . oxyCODONE-acetaminophen (PERCOCET/ROXICET) 5-325 MG tablet Take 1 tablet by mouth every 4 (four) hours as needed for severe pain. (Patient taking differently: Take 1 tablet by mouth every 4 (four) hours as needed for severe pain. ) 30 tablet 0 Past Week at Unknown time  . potassium chloride SA (K-DUR,KLOR-CON) 20 MEQ tablet Take 2 tablets (40 mEq total) by mouth daily. (Patient taking differently: Take 20 mEq by mouth 2 (two) times daily. ) 5 tablet 0 07/07/2018 at Unknown time  . Blood Pressure Monitoring (ADULT BLOOD PRESSURE CUFF LG) KIT 1 kit by Does not apply route every morning. 1 each 0 Taking    Allergies  Allergen Reactions  . Hydrocodone Nausea And Vomiting    Pt says she is not allergic, she stated allergy so the ED wouldn't medicate her.     Review of Systems: Negative except for what is mentioned in  HPI.  Physical Exam: BP (!) 158/92 (BP Location: Right Arm)   Pulse 83   Temp 98.4 F (36.9 C) (Oral)   Resp 18   SpO2 99%  CONSTITUTIONAL: Well-developed, well-nourished female in mild distress, crying.  HENT:  Normocephalic, atraumatic, External right and left ear normal. Oropharynx is clear and moist EYES: Conjunctivae and EOM are normal. Pupils are equal, round, and reactive to light.  No scleral icterus.  NECK: Normal range of motion, supple, no masses SKIN: Skin is warm and dry. No rash noted. Not diaphoretic. No erythema. No pallor. NEUROLOGIC: Alert and oriented to person, place, and time. Normal reflexes, muscle tone coordination. No cranial nerve deficit noted. PSYCHIATRIC: Normal mood and affect. Normal behavior. Normal judgment and thought content. CARDIOVASCULAR: Normal heart rate noted, regular rhythm RESPIRATORY: Effort and breath sounds normal, no problems with respiration noted ABDOMEN: Soft, nontender, nondistended, gravid. Pfannenstiel slightly separated at right aspect of incision with small amount discharge, moderately tender to palpation in upper area, slight separation at left aspect with tenderness to palpation MUSCULOSKELETAL: Normal range of motion. No edema and no tenderness. 2+ distal pulses.    Pertinent Labs/Studies:   CBC Latest Ref Rng & Units 07/07/2018 07/05/2018 07/02/2018  WBC 4.0 - 10.5 K/uL 7.8 7.3 10.3  Hemoglobin 12.0 - 15.0 g/dL 10.9(L) 9.6(L) 10.3(L)  Hematocrit 36.0 - 46.0 % 32.4(L) 28.7(L) 29.1(L)  Platelets 150 - 400 K/uL 317 251 236    Assessment : [name] is a 32 y.o. [id number] POD#6 s/p RCS for cHTN with si PEC w severe features, readmitted for 2nd course of MgSO4 based on severe range BP and new onset elevated AST. S/p multiple IV anti-hypertensives in MAU, started on MgSO4. Also with superficial wound cellulitis, will start on abx.  Plan: Admit to antepartum MgSO4 x 24 hrs Cont home anti-hypertensives will add as  necessary Keflex 500 mg QID Bedrest with bathroom privileges Strict IOs    Feliz Beam, M.D. Attending Center for [employer] (Faculty Practice)  07/07/2018, 4:51 PM

## 2018-07-07 NOTE — Progress Notes (Signed)
Per Dr Rosana Hoes cleanse incision with saline and place honeycomb dressing.  Patient tolerated this well.

## 2018-07-07 NOTE — Progress Notes (Signed)
Per Dr.Wouk take to mau for evaluation of elevated bp and hx recent delivery with precclampsia. Dressing and wound vac removed. Wound intact with pink edges- noted scant tan discharge.wound cleanded with gauze and saline. Applied loose gauze.  Called report to MAU charge nurse. Asked that they have a provider to check wound since no provider in our office currently.  Took patient to mau with her Mother and baby. Support given.  Vaughan Basta ,RN

## 2018-07-07 NOTE — MAU Provider Note (Signed)
History     CSN: [id number]  Arrival date and time: 07/07/18 1239   First Provider Initiated Contact with Patient 07/07/18 1320      Chief Complaint  Patient presents with  . Hypertension   Eleasha D Pileggi is a 32 y.o. [id number] who is 6 days S/P C-section. She was DC home from [facility] 2 days ago, and was seen today in clinic for wound check and blood pressure check. At that time her BP was 218/118. She states that she has been taking her medications, but told the RN downstairs that she had not taken anything today. She states that she takes vasotec once per day, lasix once per day and labetalol "how I was always taking it". She has a rx on file for labetalol 267m BID, but I don't see that she was getting that while she as inpatient. She states that she took everything last night, but had not taken labetalol today.    OB History    Gravida  3   Para  2   Term  2   Preterm      AB  1   Living  2     SAB  1   TAB      Ectopic      Multiple  0   Live Births  2           Past Medical History:  Diagnosis Date  . Anxiety   . Cancer ([place] 02/2012   thyroid cancer  . Hypertension   . Thyroid disease    cancer    Past Surgical History:  Procedure Laterality Date  . CESAREAN SECTION  2006  . CESAREAN SECTION N/A 07/01/2018   Procedure: CESAREAN SECTION;  Surgeon: DSloan Leiter MD;  Location: [facility]  Service: Obstetrics;  Laterality: N/A;  . DILATION AND EVACUATION N/A 02/07/2015   Procedure: DILATATION AND EVACUATION;  Surgeon: CLavonia Drafts MD;  [facility];  Service: Gynecology;  Laterality: N/A;  . THYROIDECTOMY  02/2012   total L side first and R side 7 days later.    Family History  Problem Relation Age of Onset  . Hypertension Mother   . Blindness Mother        one eye    Social History   Tobacco Use  . Smoking status: Never Smoker  . Smokeless tobacco: Never Used  Substance Use Topics  . Alcohol use: No  . Drug use:  Yes    Types: Marijuana    Comment: last used 05/19/18    Allergies:  Allergies  Allergen Reactions  . Hydrocodone Nausea And Vomiting    Pt says she is not allergic, she stated allergy so the ED wouldn't medicate her.     Medications Prior to Admission  Medication Sig Dispense Refill Last Dose  . acetaminophen (TYLENOL) 500 MG tablet Take 1,000 mg by mouth every 8 (eight) hours as needed for headache.   Taking  . aspirin EC 81 MG tablet Take 1 tablet (81 mg total) by mouth daily. 30 tablet 1 Taking  . Blood Pressure Monitoring (ADULT BLOOD PRESSURE CUFF LG) KIT 1 kit by Does not apply route every morning. 1 each 0 Taking  . docusate sodium (COLACE) 100 MG capsule Take 1 capsule (100 mg total) by mouth 2 (two) times daily as needed. (Patient not taking: Reported on 06/07/2018) 30 capsule 2 Not Taking  . enalapril (VASOTEC) 10 MG tablet Take 1 tablet (10 mg total) by mouth 2 (  two) times daily. 60 tablet 2 Taking  . furosemide (LASIX) 40 MG tablet Take 1 tablet (40 mg total) by mouth daily. 5 tablet 0 Taking  . ibuprofen (ADVIL,MOTRIN) 600 MG tablet Take 1 tablet (600 mg total) by mouth every 6 (six) hours as needed for moderate pain or cramping. 60 tablet 2 Taking  . labetalol (NORMODYNE) 200 MG tablet Take 1 tablet (200 mg total) by mouth 2 (two) times daily. 60 tablet 3 Taking  . levothyroxine (SYNTHROID, LEVOTHROID) 125 MCG tablet Take 2 tablets (250 mcg total) by mouth daily at 6 (six) AM. 60 tablet 2 Taking  . oxyCODONE-acetaminophen (PERCOCET/ROXICET) 5-325 MG tablet Take 1 tablet by mouth every 4 (four) hours as needed for severe pain. (Patient not taking: Reported on 07/07/2018) 30 tablet 0 Not Taking  . potassium chloride SA (K-DUR,KLOR-CON) 20 MEQ tablet Take 2 tablets (40 mEq total) by mouth daily. 5 tablet 0 Taking    Review of Systems Physical Exam   Blood pressure (!) 199/103, pulse 70, temperature 98.2 F (36.8 C), resp. rate 16, SpO2 100 %, unknown if currently  breastfeeding.  Physical Exam  Nursing note and vitals reviewed. Constitutional: She is oriented to person, place, and time. She appears well-developed and well-nourished. No distress.  HENT:  Head: Normocephalic.  Cardiovascular: Normal rate.  Respiratory: Effort normal.  GI: Soft. There is no tenderness. There is no rebound.  Neurological: She is alert and oriented to person, place, and time. She has normal reflexes.  No clonus   Skin: Skin is warm and dry.  Psychiatric: She has a normal mood and affect. Her behavior is normal.   Results for orders placed or performed during the hospital encounter of 07/07/18 (from the past 24 hour(s))  Protein / creatinine ratio, urine     Status: None   Collection Time: 07/07/18  1:10 PM  Result Value Ref Range   Creatinine, Urine 143.00 mg/dL   Total Protein, Urine 10 mg/dL   Protein Creatinine Ratio 0.07 0.00 - 0.15 mg/mg[Cre]  CBC with Differential/Platelet     Status: Abnormal   Collection Time: 07/07/18  1:15 PM  Result Value Ref Range   WBC 7.8 4.0 - 10.5 K/uL   RBC 3.41 (L) 3.87 - 5.11 MIL/uL   Hemoglobin 10.9 (L) 12.0 - 15.0 g/dL   HCT 32.4 (L) 36.0 - 46.0 %   MCV 95.0 80.0 - 100.0 fL   MCH 32.0 26.0 - 34.0 pg   MCHC 33.6 30.0 - 36.0 g/dL   RDW 13.0 11.5 - 15.5 %   Platelets 317 150 - 400 K/uL   nRBC 0.0 0.0 - 0.2 %   Neutrophils Relative % 67 %   Neutro Abs 5.3 1.7 - 7.7 K/uL   Lymphocytes Relative 28 %   Lymphs Abs 2.2 0.7 - 4.0 K/uL   Monocytes Relative 3 %   Monocytes Absolute 0.2 0.1 - 1.0 K/uL   Eosinophils Relative 2 %   Eosinophils Absolute 0.1 0.0 - 0.5 K/uL   Basophils Relative 0 %   Basophils Absolute 0.0 0.0 - 0.1 K/uL  Comprehensive metabolic panel     Status: Abnormal   Collection Time: 07/07/18  1:15 PM  Result Value Ref Range   Sodium 138 135 - 145 mmol/L   Potassium 4.2 3.5 - 5.1 mmol/L   Chloride 106 98 - 111 mmol/L   CO2 25 22 - 32 mmol/L   Glucose, Bld 91 70 - 99 mg/dL   BUN 18 6 - 20  mg/dL    Creatinine, Ser 0.84 0.44 - 1.00 mg/dL   Calcium 8.8 (L) 8.9 - 10.3 mg/dL   Total Protein 6.6 6.5 - 8.1 g/dL   Albumin 3.3 (L) 3.5 - 5.0 g/dL   AST 57 (H) 15 - 41 U/L   ALT 37 0 - 44 U/L   Alkaline Phosphatase 65 38 - 126 U/L   Total Bilirubin 0.6 0.3 - 1.2 mg/dL   GFR calc non Af Amer >60 >60 mL/min   GFR calc Af Amer >60 >60 mL/min   Anion gap 7 5 - 15    MAU Course  Procedures  MDM 2:37 PM Patient has had 20, 40, 80 mg labetalol and PO meds (enalapril and lasix). No improvement in her BP. Dr. Rosana Hoes updated. She will review the chart. Likely needs to be readmitted.   Assessment and Plan  PP pre-eclampsia Severe range BP Elevated liver enzymes  Admit to 3rd floor Magnesium   Marcille Buffy 07/07/2018, 1:43 PM

## 2018-07-07 NOTE — Progress Notes (Signed)
Here for wound check , states wound vac stopped working yesterday after she took a sponge bath. I explained wound vac only work up to 7 days and is now 6 days.Checked dressing and dressing is loosened and not intact to lower part of dressing. Will remove dressing and vac. BP elevated , states did not take her bp meds this am. Patient very teary and would not answer questions re: if she has ha, edema, etc. Called Dr.Wouk  Linda,RN

## 2018-07-08 ENCOUNTER — Inpatient Hospital Stay (HOSPITAL_COMMUNITY): Admit: 2018-07-08 | Payer: Self-pay | Admitting: Obstetrics & Gynecology

## 2018-07-08 ENCOUNTER — Observation Stay (HOSPITAL_BASED_OUTPATIENT_CLINIC_OR_DEPARTMENT_OTHER): Payer: Medicaid Other

## 2018-07-08 DIAGNOSIS — I361 Nonrheumatic tricuspid (valve) insufficiency: Secondary | ICD-10-CM | POA: Diagnosis not present

## 2018-07-08 DIAGNOSIS — E039 Hypothyroidism, unspecified: Secondary | ICD-10-CM | POA: Diagnosis present

## 2018-07-08 DIAGNOSIS — O1495 Unspecified pre-eclampsia, complicating the puerperium: Secondary | ICD-10-CM

## 2018-07-08 DIAGNOSIS — O115 Pre-existing hypertension with pre-eclampsia, complicating the puerperium: Secondary | ICD-10-CM | POA: Diagnosis present

## 2018-07-08 DIAGNOSIS — O1003 Pre-existing essential hypertension complicating the puerperium: Secondary | ICD-10-CM | POA: Diagnosis present

## 2018-07-08 DIAGNOSIS — O99285 Endocrine, nutritional and metabolic diseases complicating the puerperium: Secondary | ICD-10-CM | POA: Diagnosis present

## 2018-07-08 HISTORY — DX: Unspecified pre-eclampsia, complicating the puerperium: O14.95

## 2018-07-08 LAB — ECHOCARDIOGRAM COMPLETE
Height: 62 in
Weight: 4108 oz

## 2018-07-08 LAB — COMPREHENSIVE METABOLIC PANEL
ALT: 36 U/L (ref 0–44)
ALT: 33 U/L (ref 0–44)
AST: 53 U/L — ABNORMAL HIGH (ref 15–41)
AST: 51 U/L — ABNORMAL HIGH (ref 15–41)
Albumin: 3 g/dL — ABNORMAL LOW (ref 3.5–5.0)
Albumin: 3 g/dL — ABNORMAL LOW (ref 3.5–5.0)
Alkaline Phosphatase: 67 U/L (ref 38–126)
Alkaline Phosphatase: 65 U/L (ref 38–126)
Anion gap: 9 (ref 5–15)
Anion gap: 10 (ref 5–15)
BILIRUBIN TOTAL: 0.7 mg/dL (ref 0.3–1.2)
BUN: 16 mg/dL (ref 6–20)
BUN: 15 mg/dL (ref 6–20)
CO2: 25 mmol/L (ref 22–32)
CO2: 24 mmol/L (ref 22–32)
Calcium: 8.4 mg/dL — ABNORMAL LOW (ref 8.9–10.3)
Calcium: 8 mg/dL — ABNORMAL LOW (ref 8.9–10.3)
Chloride: 105 mmol/L (ref 98–111)
Chloride: 105 mmol/L (ref 98–111)
Creatinine, Ser: 0.82 mg/dL (ref 0.44–1.00)
Creatinine, Ser: 0.78 mg/dL (ref 0.44–1.00)
GFR calc Af Amer: >60 mL/min (ref >60)
GFR calc Af Amer: >60 mL/min (ref >60)
GFR calc non Af Amer: >60 mL/min (ref >60)
Glucose, Bld: 112 mg/dL — ABNORMAL HIGH (ref 70–99)
Glucose, Bld: 108 mg/dL — ABNORMAL HIGH (ref 70–99)
Potassium: 4.1 mmol/L (ref 3.5–5.1)
Potassium: 4.2 mmol/L (ref 3.5–5.1)
Sodium: 139 mmol/L (ref 135–145)
Sodium: 139 mmol/L (ref 135–145)
TOTAL PROTEIN: 6 g/dL — AB (ref 6.5–8.1)
Total Bilirubin: 0.6 mg/dL (ref 0.3–1.2)
Total Protein: 6.2 g/dL — ABNORMAL LOW (ref 6.5–8.1)

## 2018-07-08 LAB — CBC
HCT: 31.5 % — ABNORMAL LOW (ref 36.0–46.0)
HEMATOCRIT: 31.2 % — AB (ref 36.0–46.0)
Hemoglobin: 10.5 g/dL — ABNORMAL LOW (ref 12.0–15.0)
Hemoglobin: 10.6 g/dL — ABNORMAL LOW (ref 12.0–15.0)
MCH: 31.7 pg (ref 26.0–34.0)
MCH: 32.1 pg (ref 26.0–34.0)
MCHC: 33.3 g/dL (ref 30.0–36.0)
MCHC: 34 g/dL (ref 30.0–36.0)
MCV: 94.5 fL (ref 80.0–100.0)
MCV: 95.2 fL (ref 80.0–100.0)
PLATELETS: 321 10*3/uL (ref 150–400)
Platelets: 318 10*3/uL (ref 150–400)
RBC: 3.31 MIL/uL — ABNORMAL LOW (ref 3.87–5.11)
RBC: 3.3 MIL/uL — ABNORMAL LOW (ref 3.87–5.11)
RDW: 12.9 % (ref 11.5–15.5)
RDW: 12.9 % (ref 11.5–15.5)
WBC: 9.4 10*3/uL (ref 4.0–10.5)
WBC: 7.9 10*3/uL (ref 4.0–10.5)
nRBC: 0 % (ref 0.0–0.2)

## 2018-07-08 LAB — TROPONIN I
Troponin I: <0.03 ng/mL (ref <0.03)
Troponin I: <0.03 ng/mL (ref <0.03)
Troponin I: <0.03 ng/mL (ref <0.03)

## 2018-07-08 LAB — MAGNESIUM
MAGNESIUM: 4.5 mg/dL — AB (ref 1.7–2.4)
Magnesium: 5.3 mg/dL — ABNORMAL HIGH (ref 1.7–2.4)

## 2018-07-08 LAB — T4, FREE: Free T4: 0.97 ng/dL (ref 0.82–1.77)

## 2018-07-08 LAB — TSH: TSH: 7.967 u[IU]/mL — ABNORMAL HIGH (ref 0.350–4.500)

## 2018-07-08 MED ORDER — LABETALOL HCL 200 MG PO TABS
400.0000 mg | ORAL_TABLET | Freq: Two times a day (BID) | ORAL | Status: DC
  Administered 2018-07-08: 400 mg via ORAL
  Filled 2018-07-08: qty 2

## 2018-07-08 MED ORDER — HYDROCHLOROTHIAZIDE 25 MG PO TABS
25.0000 mg | ORAL_TABLET | Freq: Every day | ORAL | Status: DC
  Administered 2018-07-08 – 2018-07-11 (×4): 25 mg via ORAL
  Filled 2018-07-08 (×4): qty 1

## 2018-07-08 MED ORDER — ZOLPIDEM TARTRATE 5 MG PO TABS
5.0000 mg | ORAL_TABLET | Freq: Every evening | ORAL | Status: DC | PRN
  Administered 2018-07-08: 5 mg via ORAL
  Filled 2018-07-08: qty 1

## 2018-07-08 MED ORDER — ACETAMINOPHEN 325 MG PO TABS
650.0000 mg | ORAL_TABLET | ORAL | Status: DC | PRN
  Administered 2018-07-08 – 2018-07-10 (×6): 650 mg via ORAL
  Filled 2018-07-08 (×6): qty 2

## 2018-07-08 MED ORDER — IBUPROFEN 600 MG PO TABS
600.0000 mg | ORAL_TABLET | Freq: Four times a day (QID) | ORAL | Status: DC | PRN
  Administered 2018-07-08 – 2018-07-10 (×6): 600 mg via ORAL
  Filled 2018-07-08 (×6): qty 1

## 2018-07-08 MED ORDER — LEVOTHYROXINE SODIUM 25 MCG PO TABS
225.0000 ug | ORAL_TABLET | Freq: Every day | ORAL | Status: DC
  Administered 2018-07-08 – 2018-07-11 (×4): 225 ug via ORAL
  Filled 2018-07-08 (×4): qty 1

## 2018-07-08 MED ORDER — DOCUSATE SODIUM 100 MG PO CAPS
100.0000 mg | ORAL_CAPSULE | Freq: Two times a day (BID) | ORAL | Status: DC
  Administered 2018-07-08 – 2018-07-11 (×7): 100 mg via ORAL
  Filled 2018-07-08 (×7): qty 1

## 2018-07-08 NOTE — Progress Notes (Signed)
Subjective: Postpartum Day 7: Cesarean Delivery Patient reports incisional pain.  Periorbital swelling  Objective: Vital signs in last 24 hours: Temp:  [97.2 F (36.2 C)-98.4 F (36.9 C)] 97.9 F (36.6 C) (12/05 0400) Pulse Rate:  [69-88] 84 (12/05 0400) Resp:  [16-20] 17 (12/05 0700) BP: (109-218)/(54-118) 131/81 (12/05 0400) SpO2:  [97 %-100 %] 100 % (12/05 0400) Weight:  [116.5 kg] 116.5 kg (12/05 0546)  Physical Exam:  General: alert, cooperative and no distress Lochia: appropriate Uterine Fundus: firm Incision: no significant drainage DVT Evaluation: No evidence of DVT seen on physical exam. Calf/Ankle edema is present.  Recent Labs    07/07/18 1315 07/08/18 0049  HGB 10.9* 10.5*  HCT 32.4* 31.5*    Assessment/Plan: Status post Cesarean section. Doing well postoperatively. postpartum preeclampsia  Magnesium until 1600. Add HCTZ due to swelling.  Emeterio Reeve 07/08/2018, 7:15 AM

## 2018-07-08 NOTE — Progress Notes (Signed)
  Echocardiogram 2D Echocardiogram has been performed.  Darlina Sicilian M 07/08/2018, 11:51 AM

## 2018-07-08 NOTE — Progress Notes (Deleted)
Patient and her husband  was instructed on JP care.  She was taught how to flush the drain, empty and charge it.  She is ready for discharge and she understands her prescriptions.

## 2018-07-08 NOTE — Progress Notes (Signed)
Faculty Note  In to room for chest pain, patient reports she started having chest pain and heaviness this am, reports it feels like when she used to have panic attacks. Denies difficulty breathing but states her heart just does not feel right. Also having headache that improves with tylenol.   BP (!) 150/90 (BP Location: Left Arm)   Pulse 85   Temp 98.5 F (36.9 C) (Oral)   Resp (!) 22   Ht 5\' 2"  (1.575 m)   Wt 116.5 kg   SpO2 100%   BMI 46.96 kg/m   Gen: alert, oriented, edematous, particularly around peri-orbital area CVS: RRR Resp: LCTAB  A/P: 32 yo [id number] POD#7 s/p RCS for chronic HTN with superimposed preeclampsia with severe features readmitted for post partum preeclampsia and repeat MgSO4 therapy based on elevated AST. On 3 agents for HTN at the moment as well as MgSO4. H/o uncontrolled hypothyroidism, last TSH 11 at time of delivery, on levothyroxine 225 mcg. Last labs were stable at 8 am.  EKG normal Repeat TSH/T3/T4 Will obtain echo for chest discomfort troponins x3  K. Arvilla Meres, M.D. Attending Center for [employer] Fish farm manager)

## 2018-07-09 ENCOUNTER — Ambulatory Visit: Payer: Medicaid Other

## 2018-07-09 ENCOUNTER — Institutional Professional Consult (permissible substitution): Payer: Medicaid Other

## 2018-07-09 LAB — T3, FREE: T3, Free: 2.4 pg/mL (ref 2.0–4.4)

## 2018-07-09 MED ORDER — LABETALOL HCL 200 MG PO TABS
400.0000 mg | ORAL_TABLET | Freq: Three times a day (TID) | ORAL | Status: DC
  Administered 2018-07-09 – 2018-07-10 (×4): 400 mg via ORAL
  Filled 2018-07-09 (×4): qty 2

## 2018-07-09 MED ORDER — ENALAPRIL MALEATE 10 MG PO TABS
10.0000 mg | ORAL_TABLET | Freq: Two times a day (BID) | ORAL | Status: DC
  Administered 2018-07-09 – 2018-07-10 (×2): 10 mg via ORAL
  Filled 2018-07-09 (×2): qty 1

## 2018-07-09 NOTE — Progress Notes (Signed)
Faculty Attending Note  Subjective: Patient is feeling frustrated. Reports her abdominal pain is better, her chest pain is much better. Mild headache this am. She is ambulating and denies light-headedness or dizziness. She is voiding spontaneously. She is passing flatus. She is tolerating a regular diet without nausea/vomiting.  Objective: Blood pressure (!) 157/87, pulse 83, temperature 98.7 F (37.1 C), temperature source Oral, resp. rate 19, height 5\' 2"  (1.575 m), weight 116.5 kg, SpO2 100 %, unknown if currently breastfeeding. Temp:  [98.1 F (36.7 C)-99.1 F (37.3 C)] 98.7 F (37.1 C) (12/06 0836) Pulse Rate:  [73-93] 83 (12/06 0836) Resp:  [18-19] 19 (12/06 0836) BP: (145-168)/(81-108) 157/87 (12/06 0836) SpO2:  [87 %-100 %] 100 % (12/06 0836)  Physical Exam:  General: alert, oriented, cooperative, periorbital edema improved Chest: CTAB, normal respiratory effort Heart: RRR  Abdomen: soft, incision covered by dressing with no evidence of active bleeding, incisional tenderness improved DVT Evaluation: no evidence of DVT Extremities: no edema, no calf tenderness   Current Facility-Administered Medications:  .  acetaminophen (TYLENOL) tablet 650 mg, 650 mg, Oral, Q4H PRN, Woodroe Mode, MD, 650 mg at 07/09/18 0709 .  cephALEXin (KEFLEX) capsule 500 mg, 500 mg, Oral, Q6H, Sloan Leiter, MD, 500 mg at 07/09/18 0535 .  docusate sodium (COLACE) capsule 100 mg, 100 mg, Oral, BID, Sloan Leiter, MD, 100 mg at 07/09/18 1029 .  enalapril (VASOTEC) tablet 10 mg, 10 mg, Oral, Daily, Sloan Leiter, MD, 10 mg at 07/09/18 1029 .  enoxaparin (LOVENOX) injection 40 mg, 40 mg, Subcutaneous, Q24H, Sloan Leiter, MD, 40 mg at 07/08/18 1741 .  [DISCONTINUED] labetalol (NORMODYNE,TRANDATE) injection 20 mg, 20 mg, Intravenous, PRN, 20 mg at 07/07/18 1357 **AND** [DISCONTINUED] labetalol (NORMODYNE,TRANDATE) injection 40 mg, 40 mg, Intravenous, PRN, 40 mg at 07/07/18 1409 **AND** [DISCONTINUED]  labetalol (NORMODYNE,TRANDATE) injection 80 mg, 80 mg, Intravenous, PRN, 80 mg at 07/07/18 1426 **AND** hydrALAZINE (APRESOLINE) injection 10 mg, 10 mg, Intravenous, PRN, 10 mg at 07/07/18 1440 **AND** Measure blood pressure, , , Once, Tresea Mall, CNM .  hydrALAZINE (APRESOLINE) injection 5 mg, 5 mg, Intravenous, PRN, 5 mg at 07/09/18 0534 **AND** hydrALAZINE (APRESOLINE) injection 10 mg, 10 mg, Intravenous, PRN, 10 mg at 07/07/18 1832 **AND** labetalol (NORMODYNE,TRANDATE) injection 20 mg, 20 mg, Intravenous, PRN **AND** labetalol (NORMODYNE,TRANDATE) injection 40 mg, 40 mg, Intravenous, PRN **AND** Measure blood pressure, , , Once, Sloan Leiter, MD .  hydrochlorothiazide (HYDRODIURIL) tablet 25 mg, 25 mg, Oral, Daily, Woodroe Mode, MD, 25 mg at 07/09/18 1029 .  ibuprofen (ADVIL,MOTRIN) tablet 600 mg, 600 mg, Oral, Q6H PRN, Woodroe Mode, MD, 600 mg at 07/09/18 0843 .  labetalol (NORMODYNE) tablet 400 mg, 400 mg, Oral, TID, Sloan Leiter, MD, 400 mg at 07/09/18 1030 .  lactated ringers infusion, , Intravenous, Continuous, Tresea Mall, CNM, Stopped at 07/08/18 1229 .  levothyroxine (SYNTHROID, LEVOTHROID) tablet 225 mcg, 225 mcg, Oral, Q0600, Sloan Leiter, MD, 225 mcg at 07/09/18 0535 .  zolpidem (AMBIEN) tablet 5 mg, 5 mg, Oral, QHS PRN, Lavonia Drafts, MD, 5 mg at 07/08/18 2228 Recent Labs    07/08/18 0049 07/08/18 0800  HGB 10.5* 10.6*  HCT 31.5* 31.2*    Assessment/Plan:  Patient is 32 y.o. [id number] POD#8 s/p RCS for chronic HTN with superimposed preeclampsia with severe features readmitted for post partum preeclampsia and repeat MgSO4 therapy based on elevated AST. Now s/p MgSO4 x 24 hrs. On 3 agents for HTN at the  moment, have increased dosing this am. H/o uncontrolled hypothyroidism, last TSH 11 at time of delivery, on levothyroxine 225 mcg.   EKG normal Echo normal Repeat TSH/T3/T4 improved troponins normal Will titrate BP meds as needed Regular  diet Pain meds prn   Feliz Beam, M.D. Attending Center for [employer] (Faculty Practice)  07/09/2018, 11:45 AM

## 2018-07-09 NOTE — Progress Notes (Signed)
   07/09/18 2126  Provider Notification  Reason for Communication Other (Comment)  Provider Name Dr. Elly Modena  Provider Role Attending physician  Method of Communication Call  Response Other (Comment) (honey comb came off)  Notification Time 2126  Pt 's honey comb fell off. MD does not want it replaced. Ordered to put a pad on the panis for dryness

## 2018-07-09 NOTE — Progress Notes (Signed)
I received a [place] consult due to pt being separated from her baby while pt is in the hospital. She is feeling sad to be away from her baby, and frustrated with still being in the hospital.  We talked about strategies for coping and I offered her a ministry of presence.  Harris, [place]so Pager, [phone] 2:35 PM    07/09/18 1400  Clinical Encounter Type  Visited With Patient  Visit Type Spiritual support  Referral From Nurse

## 2018-07-10 MED ORDER — ENALAPRIL MALEATE 20 MG PO TABS
20.0000 mg | ORAL_TABLET | Freq: Two times a day (BID) | ORAL | Status: DC
  Administered 2018-07-10 – 2018-07-11 (×2): 20 mg via ORAL
  Filled 2018-07-10 (×2): qty 1

## 2018-07-10 MED ORDER — LABETALOL HCL 200 MG PO TABS
600.0000 mg | ORAL_TABLET | Freq: Three times a day (TID) | ORAL | Status: DC
  Administered 2018-07-10 – 2018-07-11 (×3): 600 mg via ORAL
  Filled 2018-07-10 (×2): qty 3
  Filled 2018-07-10: qty 6

## 2018-07-10 MED ORDER — BACITRACIN-NEOMYCIN-POLYMYXIN OINTMENT TUBE
TOPICAL_OINTMENT | Freq: Two times a day (BID) | CUTANEOUS | Status: DC
  Administered 2018-07-10 – 2018-07-11 (×3): via TOPICAL
  Filled 2018-07-10: qty 14.17

## 2018-07-10 NOTE — Progress Notes (Addendum)
readmission now POD #9 s/p Rpeat C/S for second round of Mag sulfate and BP control Pt was thought to have incision infection without fever, on PO keflex.  By my exam she has some asymmetric reapproximation of the right side of the incision, made worse by her large pannus pulling on the upper side of the incision.  It does not appear to be a cellulitis but rather the exposed healing upper edge of the incision Subjective: Patient reports incisional pain.  With movement.  Plans were for discharge today on blood pressure medicines but upon rounds this morning she had a blood pressure of 831 systolic, 517 systolic on recheck so we will continue to adjust medicines upward today give her some a Apresoline acutely and hope for discharge in a.m.  Objective: I have reviewed patient's vital signs and medications. .vs Temp:  [98.2 F (36.8 C)-99.1 F (37.3 C)] 98.6 F (37 C) (12/07 1126) Pulse Rate:  [78-96] 90 (12/07 1126) Resp:  [16-19] 18 (12/07 1126) BP: (140-191)/(78-109) 147/96 (12/07 1126) SpO2:  [95 %-100 %] 98 % (12/07 1126) BP (!) 147/96 (BP Location: Left Arm)   Pulse 90   Temp 98.6 F (37 C) (Oral)   Resp 18   Ht 5\' 2"  (1.575 m)   Wt 116.5 kg   SpO2 98%   BMI 46.96 kg/m    General: alert, cooperative and no distress Resp: clear to auscultation bilaterally GI: soft, non-tender; bowel sounds normal; no masses,  no organomegaly and incision: clean, intact and serous and Small amount of clear drainage coming from the right side.  Is there is no deep defects it simply that the incision is asymmetrically reapproximated due to the large pannus just above the incision which is in the deep skin crease drainage present Small amount of clear drainage on a transverse sanitary pad  Assessment" Chronic HTN , uncontrolled. Postpartum, presumed Superimposed PreE with Severe features based on BP and elevated AST. S/P Mag sulfate again this admission. BP too high for d/c this  a.m.  Plan: Acute Tx with apresoline  Increase Labetalol to 600 tid, increase Vasotec (enalapril) to 20 bid. Probable d/c in a.m. Topical neosporin to wound. D/c keflex. CMP in a.m.  LOS: 2 days    Jonnie Kind 07/10/2018, 11:55 AM    Patient ID: [name], female   DOB: 02/01/1986, 32 y.o.   MRN: [id number]

## 2018-07-11 LAB — COMPREHENSIVE METABOLIC PANEL
ALT: 31 U/L (ref 0–44)
AST: 47 U/L — ABNORMAL HIGH (ref 15–41)
Albumin: 3 g/dL — ABNORMAL LOW (ref 3.5–5.0)
Alkaline Phosphatase: 50 U/L (ref 38–126)
Anion gap: 9 (ref 5–15)
BUN: 23 mg/dL — AB (ref 6–20)
CO2: 23 mmol/L (ref 22–32)
Calcium: 8.6 mg/dL — ABNORMAL LOW (ref 8.9–10.3)
Chloride: 107 mmol/L (ref 98–111)
Creatinine, Ser: 0.87 mg/dL (ref 0.44–1.00)
GFR calc Af Amer: >60 mL/min (ref >60)
GFR calc non Af Amer: >60 mL/min (ref >60)
GLUCOSE: 82 mg/dL (ref 70–99)
Potassium: 4 mmol/L (ref 3.5–5.1)
Sodium: 139 mmol/L (ref 135–145)
Total Bilirubin: 0.7 mg/dL (ref 0.3–1.2)
Total Protein: 5.8 g/dL — ABNORMAL LOW (ref 6.5–8.1)

## 2018-07-11 MED ORDER — LABETALOL HCL 300 MG PO TABS: 600.0000 mg | ORAL_TABLET | Freq: Three times a day (TID) | ORAL | 5 refills | Status: DC

## 2018-07-11 MED ORDER — ENALAPRIL MALEATE 20 MG PO TABS: 20.0000 mg | ORAL_TABLET | Freq: Two times a day (BID) | ORAL | 1 refills | Status: DC

## 2018-07-11 NOTE — Progress Notes (Signed)
Discharged home, ambulatory, in stable condition. 

## 2018-07-11 NOTE — Discharge Summary (Signed)
OB Discharge Summary     Patient Name: [name] DOB: Jul 26, 1986 MRN: [id number]  Date of admission: 07/07/2018 Delivering MD: This patient has no babies on file.  Date of discharge: 07/11/2018  Admitting diagnosis: C-S ON 11-28 PP ELEVSTED BP postpartum hypertension severe Intrauterine pregnancy: Unknown, status post delivery    Secondary diagnosis:  Active Problems:   Hypertension   Preeclampsia   Preeclampsia in postpartum period  Additional problems: Hypothyroidism     Discharge diagnosis: Postpartum hypertension, hypothyroidism,                                                                                                Post partum procedures:Blood pressure regulation  Augmentation:   Complications: None  Hospital course: This 32 year old gravida 3 para 2-0-1-2 postop day 6 after repeat cesarean section for chronic hypertension with superimposed preeclampsia with severe features was readmitted on 4/04 2019 with blood pressures in the range of up to 122 systolic over 90 diastolic.  She was restarted on magnesium sulfate x24 hours in addition to as needed IV antihypertensives she was placed on labetalol 400 mg 3 times daily and Vasotec 10 mg daily was increased10 mg twice daily and then increased again to 20 mg twice daily She had concerns over her incision which was thought to have superficial cellulitis but on my evaluation prior to discharge it was not inflamed other than some asymmetry of the edges with the upper edge of the incision sliding forward and now healing up by secondary intent intention, along a 3 mm wide strip of tissue on the right half of the incision Physical exam: Vitals:   07/10/18 2331 07/11/18 0419 07/11/18 0600 07/11/18 0938  BP: 137/81 (!) 151/89  (!) 154/98  Pulse: 85   84  Resp: '18 18  16  ' Temp: 98.2 F (36.8 C) 98 F (36.7 C)  98.4 F (36.9 C)  TempSrc: Oral Oral  Oral  SpO2: 98%   100%  Weight:   115.2 kg   Height:        General: alert, cooperative and no distress Lochia: appropriate Uterine Fundus: Nonpalpable patient obese Incision: Small amount of drainage from the asymmetric closure of the right half of the incision, along the 2-3 mm wide strip of incision healing by secondary intention DVT Evaluation: No evidence of DVT seen on physical exam. Labs: Lab Results  Component Value Date   WBC 7.9 07/08/2018   HGB 10.6 (L) 07/08/2018   HCT 31.2 (L) 07/08/2018   MCV 94.5 07/08/2018   PLT 318 07/08/2018   CMP Latest Ref Rng & Units 07/11/2018  Glucose 70 - 99 mg/dL 82  BUN 6 - 20 mg/dL 23(H)  Creatinine 0.44 - 1.00 mg/dL 0.87  Sodium 135 - 145 mmol/L 139  Potassium 3.5 - 5.1 mmol/L 4.0  Chloride 98 - 111 mmol/L 107  CO2 22 - 32 mmol/L 23  Calcium 8.9 - 10.3 mg/dL 8.6(L)  Total Protein 6.5 - 8.1 g/dL 5.8(L)  Total Bilirubin 0.3 - 1.2 mg/dL 0.7  Alkaline Phos 38 - 126 U/L 50  AST 15 -  41 U/L 47(H)  ALT 0 - 44 U/L 31    Discharge instruction: per After Visit Summary and "Baby and Me Booklet".  After visit meds:  Allergies as of 07/11/2018      Reactions   Hydrocodone Nausea And Vomiting   Pt says she is not allergic, she stated allergy so the ED wouldn't medicate her.       Medication List    STOP taking these medications   Adult Blood Pressure Cuff Lg Kit   aspirin EC 81 MG tablet   furosemide 40 MG tablet Commonly known as:  LASIX   oxyCODONE-acetaminophen 5-325 MG tablet Commonly known as:  PERCOCET/ROXICET   potassium chloride SA 20 MEQ tablet Commonly known as:  K-DUR,KLOR-CON     TAKE these medications   acetaminophen 500 MG tablet Commonly known as:  TYLENOL Take 1,000 mg by mouth every 8 (eight) hours as needed for headache.   docusate sodium 100 MG capsule Commonly known as:  COLACE Take 1 capsule (100 mg total) by mouth 2 (two) times daily as needed.   enalapril 20 MG tablet Commonly known as:  VASOTEC Take 1 tablet (20 mg total) by mouth 2 (two) times  daily. What changed:    medication strength  how much to take   ibuprofen 600 MG tablet Commonly known as:  ADVIL,MOTRIN Take 1 tablet (600 mg total) by mouth every 6 (six) hours as needed for moderate pain or cramping.   labetalol 300 MG tablet Commonly known as:  NORMODYNE Take 2 tablets (600 mg total) by mouth 3 (three) times daily. What changed:    medication strength  how much to take  when to take this   levothyroxine 125 MCG tablet Commonly known as:  SYNTHROID, LEVOTHROID Take 2 tablets (250 mcg total) by mouth daily at 6 (six) AM. What changed:    how much to take  when to take this       Diet: routine diet  Activity: Advance as tolerated. Pelvic rest for 6 weeks.   Outpatient follow up: 1 week blood pressure check at [facility] Follow up Appt: Future Appointments  Date Time Provider [place]  08/02/2018  3:15 PM Tresea Mall, CNM [facility]   Follow up Visit:No follow-ups on file.  Postpartum contraception: Not Discussed  Newborn Data: This patient has no babies on file. Baby Feeding: Bottle Disposition:home with mother   07/11/2018 Jonnie Kind, MD

## 2018-07-11 NOTE — Discharge Instructions (Signed)
CHECK blood pressures daily, record them and bring them to your follow-up visit in 1 week Applied topical Neosporin to your wound.  I do not think there is an active infection but that the edges have separated slightly and simply coating the edge with Neosporin and keeping it stable with a sanitary napkin over it placed transversely will help healing tremendously expect dramatic progress in the next week.

## 2018-07-12 ENCOUNTER — Emergency Department (HOSPITAL_COMMUNITY)
Admission: EM | Admit: 2018-07-12 | Discharge: 2018-07-13 | Disposition: A | Discharge status: Home / Self Care | Payer: Medicaid Other | Attending: Emergency Medicine | Admitting: Emergency Medicine

## 2018-07-12 ENCOUNTER — Encounter (HOSPITAL_COMMUNITY): Payer: Self-pay | Admitting: Emergency Medicine

## 2018-07-12 ENCOUNTER — Telehealth: Payer: Self-pay

## 2018-07-12 ENCOUNTER — Encounter: Payer: Medicaid Other | Admitting: Obstetrics and Gynecology

## 2018-07-12 ENCOUNTER — Other Ambulatory Visit: Payer: Medicaid Other

## 2018-07-12 DIAGNOSIS — B373 Candidiasis of vulva and vagina: Secondary | ICD-10-CM

## 2018-07-12 DIAGNOSIS — Z85850 Personal history of malignant neoplasm of thyroid: Secondary | ICD-10-CM | POA: Insufficient documentation

## 2018-07-12 DIAGNOSIS — O135 Gestational [pregnancy-induced] hypertension without significant proteinuria, complicating the puerperium: Secondary | ICD-10-CM | POA: Insufficient documentation

## 2018-07-12 DIAGNOSIS — B3731 Acute candidiasis of vulva and vagina: Secondary | ICD-10-CM

## 2018-07-12 DIAGNOSIS — I10 Essential (primary) hypertension: Secondary | ICD-10-CM | POA: Diagnosis present

## 2018-07-12 DIAGNOSIS — R8281 Pyuria: Secondary | ICD-10-CM | POA: Diagnosis not present

## 2018-07-12 DIAGNOSIS — F419 Anxiety disorder, unspecified: Secondary | ICD-10-CM | POA: Insufficient documentation

## 2018-07-12 DIAGNOSIS — Z79899 Other long term (current) drug therapy: Secondary | ICD-10-CM | POA: Diagnosis not present

## 2018-07-12 DIAGNOSIS — B9689 Other specified bacterial agents as the cause of diseases classified elsewhere: Secondary | ICD-10-CM

## 2018-07-12 DIAGNOSIS — O139 Gestational [pregnancy-induced] hypertension without significant proteinuria, unspecified trimester: Secondary | ICD-10-CM

## 2018-07-12 DIAGNOSIS — N760 Acute vaginitis: Principal | ICD-10-CM

## 2018-07-12 DIAGNOSIS — E039 Hypothyroidism, unspecified: Secondary | ICD-10-CM | POA: Insufficient documentation

## 2018-07-12 LAB — CBC WITH DIFFERENTIAL/PLATELET
Abs Immature Granulocytes: 0.05 10*3/uL (ref 0.00–0.07)
Basophils Absolute: 0 10*3/uL (ref 0.0–0.1)
Basophils Relative: 0 %
EOS PCT: 1 %
Eosinophils Absolute: 0.1 10*3/uL (ref 0.0–0.5)
HCT: 33.4 % — ABNORMAL LOW (ref 36.0–46.0)
Hemoglobin: 10.9 g/dL — ABNORMAL LOW (ref 12.0–15.0)
Immature Granulocytes: 0 %
Lymphocytes Relative: 21 %
Lymphs Abs: 2.4 10*3/uL (ref 0.7–4.0)
MCH: 32 pg (ref 26.0–34.0)
MCHC: 32.6 g/dL (ref 30.0–36.0)
MCV: 97.9 fL (ref 80.0–100.0)
Monocytes Absolute: 0.9 10*3/uL (ref 0.1–1.0)
Monocytes Relative: 8 %
NRBC: 0 % (ref 0.0–0.2)
Neutro Abs: 8.2 10*3/uL — ABNORMAL HIGH (ref 1.7–7.7)
Neutrophils Relative %: 70 %
Platelets: 312 10*3/uL (ref 150–400)
RBC: 3.41 MIL/uL — ABNORMAL LOW (ref 3.87–5.11)
RDW: 12.3 % (ref 11.5–15.5)
WBC: 11.6 10*3/uL — ABNORMAL HIGH (ref 4.0–10.5)

## 2018-07-12 LAB — PROTEIN / CREATININE RATIO, URINE
Creatinine, Urine: 147.21 mg/dL
Protein Creatinine Ratio: 0.09 mg/mg{Cre} (ref 0.00–0.15)
Total Protein, Urine: 13 mg/dL

## 2018-07-12 LAB — URINALYSIS, ROUTINE W REFLEX MICROSCOPIC
Bacteria, UA: NONE SEEN
Bilirubin Urine: NEGATIVE
GLUCOSE, UA: NEGATIVE mg/dL
Ketones, ur: NEGATIVE mg/dL
Nitrite: NEGATIVE
Protein, ur: NEGATIVE mg/dL
RBC / HPF: >50 RBC/hpf — ABNORMAL HIGH (ref 0–5)
Specific Gravity, Urine: 1.017 (ref 1.005–1.030)
pH: 5 (ref 5.0–8.0)

## 2018-07-12 LAB — COMPREHENSIVE METABOLIC PANEL
ALT: 39 U/L (ref 0–44)
ANION GAP: 12 (ref 5–15)
AST: 57 U/L — ABNORMAL HIGH (ref 15–41)
Albumin: 3.6 g/dL (ref 3.5–5.0)
Alkaline Phosphatase: 56 U/L (ref 38–126)
BILIRUBIN TOTAL: 0.8 mg/dL (ref 0.3–1.2)
BUN: 19 mg/dL (ref 6–20)
CO2: 23 mmol/L (ref 22–32)
Calcium: 9 mg/dL (ref 8.9–10.3)
Chloride: 105 mmol/L (ref 98–111)
Creatinine, Ser: 0.95 mg/dL (ref 0.44–1.00)
GFR calc Af Amer: >60 mL/min (ref >60)
GFR calc non Af Amer: >60 mL/min (ref >60)
Glucose, Bld: 91 mg/dL (ref 70–99)
Potassium: 4 mmol/L (ref 3.5–5.1)
Sodium: 140 mmol/L (ref 135–145)
TOTAL PROTEIN: 6.6 g/dL (ref 6.5–8.1)

## 2018-07-12 LAB — MAGNESIUM: Magnesium: 1.7 mg/dL (ref 1.7–2.4)

## 2018-07-12 MED ORDER — METRONIDAZOLE 500 MG PO TABS: 500.0000 mg | ORAL_TABLET | Freq: Two times a day (BID) | ORAL | 0 refills | Status: DC

## 2018-07-12 MED ORDER — FLUCONAZOLE 150 MG PO TABS: 150.0000 mg | ORAL_TABLET | Freq: Once | ORAL | 0 refills | Status: AC

## 2018-07-12 MED ORDER — HYDRALAZINE HCL 20 MG/ML IJ SOLN: 10.0000 mg | Freq: Once | INTRAMUSCULAR | Status: DC

## 2018-07-12 MED ORDER — LABETALOL HCL 200 MG PO TABS
200.0000 mg | ORAL_TABLET | Freq: Once | ORAL | Status: AC
  Administered 2018-07-12: 200 mg via ORAL
  Filled 2018-07-12: qty 1

## 2018-07-12 MED ORDER — LABETALOL HCL 5 MG/ML IV SOLN
10.0000 mg | Freq: Once | INTRAVENOUS | Status: DC
  Filled 2018-07-12 (×2): qty 4

## 2018-07-12 MED ORDER — ENALAPRIL MALEATE 10 MG PO TABS
20.0000 mg | ORAL_TABLET | Freq: Once | ORAL | Status: AC
  Administered 2018-07-12: 20 mg via ORAL
  Filled 2018-07-12: qty 2

## 2018-07-12 MED ORDER — LABETALOL HCL 300 MG PO TABS
600.0000 mg | ORAL_TABLET | Freq: Once | ORAL | Status: AC
  Administered 2018-07-12: 600 mg via ORAL
  Filled 2018-07-12: qty 2

## 2018-07-12 NOTE — Telephone Encounter (Signed)
Called Pt. To advise that she tested positive for BV & Yeast Infection. Sent Rx Flagyl for BV & Diflucan for Yeast Infection.No answer, left VM.

## 2018-07-12 NOTE — ED Provider Notes (Addendum)
[place] DEPT Provider Note   CSN: [id number] Arrival date & time: 07/12/18  2003     History   Chief Complaint Chief Complaint  Patient presents with  . Hypertension    HPI [name] is a 32 y.o. female.  HPI 32 year old female with history as below including severe preeclampsia status post recent cesarean section here with hypertension.  Patient has a complex recent history including multiple admissions at [facility] for severe hypertension.  She was recently admitted just several days ago and discharged yesterday for hypertension.  Her blood pressure was in the 150s to 160s on discharge.  She reports that she is been taking her medications but it has been elevated throughout the day.  She is been high as the 323F systolic.  She strongly denies any associated symptoms.  Specifically she denies any headache, vision changes, right upper quadrant pain, nausea, or vomiting.  She states that she had a small area of infection in her lower abdominal wound from the C-section, which she states is improving.  This is been monitored by OB.  No other complaints.  No urinary symptoms.    Past Medical History:  Diagnosis Date  . Anxiety   . Cancer ([place]) 02/2012   thyroid cancer  . Hypertension   . Thyroid disease    cancer    Patient Active Problem List   Diagnosis Date Noted  . Preeclampsia in postpartum period 07/08/2018  . Preeclampsia 07/07/2018  . Severe Pre-eclampsia superimposed on chronic hypertension, delivered 07/01/2018  . Substance abuse affecting pregnancy in third trimester, antepartum 05/18/2018  . Obesity, Class III, BMI 40-49.9 (morbid obesity) ([place]) 12/30/2017  . Obesity affecting pregnancy, antepartum 12/30/2017  . Hypothyroidism during pregnancy, antepartum 12/30/2017  . Supervision of high risk pregnancy, antepartum 12/16/2017  . Chronic hypertension in pregnancy 12/16/2017  . History of cesarean delivery 12/16/2017  .  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  . Hematuria 11/11/2017  . Hypertension   . 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 Thyroid cancer ([place]) 09/11/2016    Past Surgical History:  Procedure Laterality Date  . CESAREAN SECTION  2006  . CESAREAN SECTION N/A 07/01/2018   Procedure: CESAREAN SECTION;  Surgeon: Sloan Leiter, MD;  Location: [place];  Service: Obstetrics;  Laterality: N/A;  . DILATION AND EVACUATION N/A 02/07/2015   Procedure: DILATATION AND EVACUATION;  Surgeon: Lavonia Drafts, MD;  Location: [place];  Service: Gynecology;  Laterality: N/A;  . THYROIDECTOMY  02/2012   total L side first and R side 7 days later.     OB History    Gravida  3   Para  2   Term  2   Preterm      AB  1   Living  2     SAB  1   TAB      Ectopic      Multiple  0   Live Births  2            Home Medications    Prior to Admission medications   Medication Sig Start Date End Date Taking? Authorizing Provider  docusate sodium (COLACE) 100 MG capsule Take 1 capsule (100 mg total) by mouth 2 (two) times daily as needed. Patient taking differently: Take 100 mg by mouth 2 (two) times daily as needed for mild constipation.  05/17/18  Yes Donnamae Jude, MD  enalapril (VASOTEC) 20 MG tablet Take 1 tablet (20 mg total) by mouth 2 (two) times  daily. 07/11/18  Yes Jonnie Kind, MD  levothyroxine (SYNTHROID, LEVOTHROID) 125 MCG tablet Take 2 tablets (250 mcg total) by mouth daily at 6 (six) AM. 07/06/18  Yes Anyanwu, Sallyanne Havers, MD  cephALEXin (KEFLEX) 500 MG capsule Take 1 capsule (500 mg total) by mouth 3 (three) times daily for 5 days. 07/13/18 07/18/18  Duffy Bruce, MD  labetalol (NORMODYNE) 200 MG tablet Take 4 tablets (800 mg total) by mouth 3 (three) times daily for 7 days. 07/13/18 07/20/18  Duffy Bruce, MD  metroNIDAZOLE (FLAGYL) 500 MG tablet Take 1 tablet (500 mg total) by mouth 2 (two) times daily. 07/12/18   Sloan Leiter, MD    Family  History Family History  Problem Relation Age of Onset  . Hypertension Mother   . Blindness Mother        one eye    Social History Social History   Tobacco Use  . Smoking status: Never Smoker  . Smokeless tobacco: Never Used  Substance Use Topics  . Alcohol use: No  . Drug use: Yes    Types: Marijuana    Comment: last used 05/19/18     Allergies   Hydrocodone   Review of Systems Review of Systems  Constitutional: Negative for chills and fever.  HENT: Negative for congestion, rhinorrhea and sore throat.   Eyes: Negative for visual disturbance.  Respiratory: Negative for cough, shortness of breath and wheezing.   Cardiovascular: Negative for chest pain and leg swelling.  Gastrointestinal: Negative for abdominal pain, diarrhea, nausea and vomiting.  Genitourinary: Negative for dysuria, flank pain, vaginal bleeding and vaginal discharge.  Musculoskeletal: Negative for neck pain.  Skin: Negative for rash.  Allergic/Immunologic: Negative for immunocompromised state.  Neurological: Negative for syncope and headaches.  Hematological: Does not bruise/bleed easily.  All other systems reviewed and are negative.    Physical Exam Updated Vital Signs BP (!) 181/111   Pulse 83   Temp 98.6 F (37 C)   Resp 20   Ht 5\' 2"  (1.575 m)   Wt 114.4 kg   SpO2 100%   BMI 46.13 kg/m   Physical Exam  Constitutional: She is oriented to person, place, and time. She appears well-developed and well-nourished. No distress.  HENT:  Head: Normocephalic and atraumatic.  Eyes: Conjunctivae are normal.  Neck: Neck supple.  Cardiovascular: Normal rate, regular rhythm and normal heart sounds. Exam reveals no friction rub.  No murmur heard. Pulmonary/Chest: Effort normal and breath sounds normal. No respiratory distress. She has no wheezes. She has no rales.  Abdominal: Soft. Bowel sounds are normal. She exhibits no distension. There is no tenderness. There is no rebound and no guarding.    Incision c/d, with small area of dehiscence but intact granulation, no gaping tissue, no surrounding erythema  Musculoskeletal: She exhibits no edema.  Neurological: She is alert and oriented to person, place, and time. She exhibits normal muscle tone.  Skin: Skin is warm. Capillary refill takes less than 2 seconds.  Psychiatric: She has a normal mood and affect.  Nursing note and vitals reviewed.    ED Treatments / Results  Labs (all labs ordered are listed, but only abnormal results are displayed) Labs Reviewed  CBC WITH DIFFERENTIAL/PLATELET - Abnormal; Notable for the following components:      Result Value   WBC 11.6 (*)    RBC 3.41 (*)    Hemoglobin 10.9 (*)    HCT 33.4 (*)    Neutro Abs 8.2 (*)    All  other components within normal limits  COMPREHENSIVE METABOLIC PANEL - Abnormal; Notable for the following components:   AST 57 (*)    All other components within normal limits  URINALYSIS, ROUTINE W REFLEX MICROSCOPIC - Abnormal; Notable for the following components:   Hgb urine dipstick LARGE (*)    Leukocytes, UA MODERATE (*)    RBC / HPF >50 (*)    All other components within normal limits  PROTEIN / CREATININE RATIO, URINE  MAGNESIUM    EKG None  Radiology No results found.  Procedures Procedures (including critical care time)  Emergency Ultrasound Study:   Angiocath insertion Performed by: Evonnie Pat Consent: Verbal consent/emergent consent obtained. Risks and benefits: risks, benefits and alternatives were discussed Immediately prior to procedure the correct patient, procedure, equipment, support staff and site/side marked as needed.  Indication: difficult IV access Preparation: Patient was prepped and draped in the usual sterile fashion. Sterile gel was used for this procedure and the ultrasound probe was sterilized prior to use. Vein Location: Left forearm vein was visualized during assessment for potential access sites and was found to be  patent/ easily compressed with linear ultrasound.  The needle was visualized with real-time ultrasound and guided into the vein. Gauge: 20 Image saved and stored.  Normal blood return.   Patient tolerance: Patient tolerated the procedure well with no immediate complications.       Medications Ordered in ED Medications  labetalol (NORMODYNE) tablet 200 mg (200 mg Oral Given 07/12/18 2253)  enalapril (VASOTEC) tablet 20 mg (20 mg Oral Given 07/12/18 2330)  labetalol (NORMODYNE) tablet 600 mg (600 mg Oral Given 07/12/18 2330)     Initial Impression / Assessment and Plan / ED Course  I have reviewed the triage vital signs and the nursing notes.  Pertinent labs & imaging results that were available during my care of the patient were reviewed by me and considered in my medical decision making (see chart for details).     32 year old female here with pregnancy-induced hypertension.  No symptoms.  Patient was just discharged following admission for the same.  On exam, she is very well-appearing and in no distress.  No headache.  No right upper quadrant pain.  Her incision appears clean, dry, and intact and is healing via secondary intention well.  No surrounding redness.  Discussed case with Dr. Roselie Awkward of OB/GYN.  Of note, after this discussion, I learned the patient has not been taking her enalapril as prescribed, as she was confused she only took it once a day.  Current plan is to give her additional, increased dose of labetalol to 800 mg, as well as her enalapril.  If her blood pressure is improved in less than severe range, can likely follow-up as an outpatient.  Final Clinical Impressions(s) / ED Diagnoses   Final diagnoses:  Pregnancy induced hypertension, antepartum  Pyuria    ED Discharge Orders         Ordered    labetalol (NORMODYNE) 200 MG tablet  3 times daily     07/13/18 0010    cephALEXin (KEFLEX) 500 MG capsule  3 times daily     07/13/18 0014           Duffy Bruce, MD 07/13/18 Vicenta Dunning    Duffy Bruce, MD 07/13/18 [phone]

## 2018-07-12 NOTE — ED Triage Notes (Signed)
Patient here from home with complaints of hypertension. Reports that she recently had a baby on 11/28. Suppose to taking labetalol, does not take regularly. Headache.

## 2018-07-13 MED ORDER — CEPHALEXIN 500 MG PO CAPS: 500.0000 mg | ORAL_CAPSULE | Freq: Three times a day (TID) | ORAL | 0 refills | Status: AC

## 2018-07-13 MED ORDER — LABETALOL HCL 200 MG PO TABS: 800.0000 mg | ORAL_TABLET | Freq: Three times a day (TID) | ORAL | 0 refills | Status: DC

## 2018-07-13 NOTE — Discharge Instructions (Signed)
FOR YOUR BLOOD PRESSURE:  - Take the enalapril (Vasotec) TWICE a day, instead of ONCE a day; if you have the 10 mg tablets, it will be two 10 mg tablets TWICE a day, for a total of 40 mg per day - We are going to INCREASE your Labetalol from 600 mg to 800 mg three times a day; since you have the 200 mg tablets, this will be 4 instead of 3 tablets three times a day  Your urine also shows a possible infection - I have called in an antibiotic for this

## 2018-07-14 LAB — URINE CULTURE

## 2018-07-16 ENCOUNTER — Ambulatory Visit (INDEPENDENT_AMBULATORY_CARE_PROVIDER_SITE_OTHER): Payer: Medicaid Other | Admitting: General Practice

## 2018-07-16 VITALS — BP 148/83 | HR 89 | Ht 62.0 in | Wt 253.0 lb

## 2018-07-16 DIAGNOSIS — Z0130 Encounter for examination of blood pressure without abnormal findings: Secondary | ICD-10-CM

## 2018-07-16 DIAGNOSIS — O10919 Unspecified pre-existing hypertension complicating pregnancy, unspecified trimester: Secondary | ICD-10-CM

## 2018-07-16 MED ORDER — LABETALOL HCL 200 MG PO TABS: 800.0000 mg | ORAL_TABLET | Freq: Three times a day (TID) | ORAL | 0 refills | Status: DC

## 2018-07-16 MED ORDER — HYDROCHLOROTHIAZIDE 25 MG PO TABS: 25.0000 mg | ORAL_TABLET | Freq: Every day | ORAL | 0 refills | Status: DC

## 2018-07-16 NOTE — Progress Notes (Signed)
Patient presents to office today for BP check following recent ER visit. Patient delivered via c-section 11/28 & postpartum course has been complicated by readmission for pre-e 12/4-12/8 and ER visit 12/9. Patient denies headaches, dizziness, or blurry vision. Edema +1 noted on legs/ankles. Patient reports taking enalapril 20mg  BID & labetalol 800mg  TID- states she took her medication this AM at 7.   Reviewed patient's blood pressures/history with Dr Nehemiah Settle who ordered patient begin HCTZ 25mg  daily & patient should followup with PCP as soon as possible.   Informed patient of new Rx and directions. Advised she make appointment with PCP as soon as she can get in. Patient verbalized understanding & reports seeing Dr Nancy Fetter with [facility]. Patient had no questions and will follow up with [place] on 12/30 for pp visit.   Whitney Bound RN BSN 07/16/18

## 2018-07-16 NOTE — Progress Notes (Signed)
Chart reviewed - agree with RN documentation.   

## 2018-08-02 ENCOUNTER — Ambulatory Visit: Payer: Medicaid Other | Admitting: Advanced Practice Midwife

## 2018-10-09 IMAGING — CT CT HEAD W/O CM
4 series · 16 of 47 positions shown, 18 images · non-contrast
Comparison: 06/16/2017 head CT.

CLINICAL DATA: Frontal headache and nausea since last night. No
reported injury. History of thyroid cancer.

EXAM:
CT HEAD WITHOUT CONTRAST
TECHNIQUE: Contiguous axial images were obtained from the base of the skull
through the vertex without intravenous contrast.

[Series 3: head without · axial · non-contrast · 0.46mm/px · z∈[-87,+33]mm · 7 of 33 slices shown, 9 images]
[im 5/33  brain]
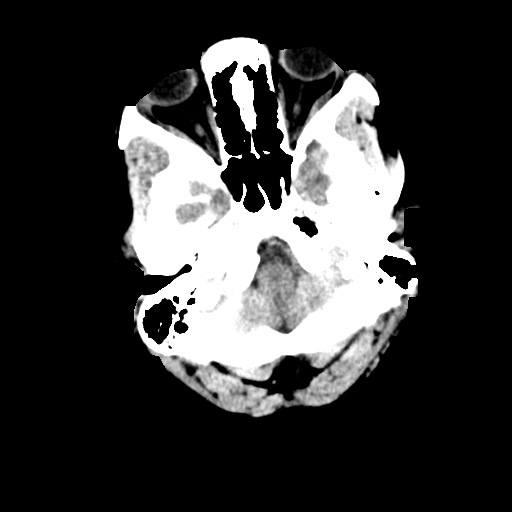
[im 5/33  bone]
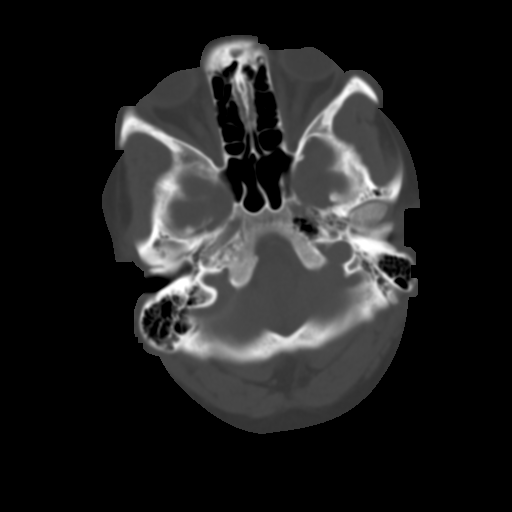
[im 9/33  brain]
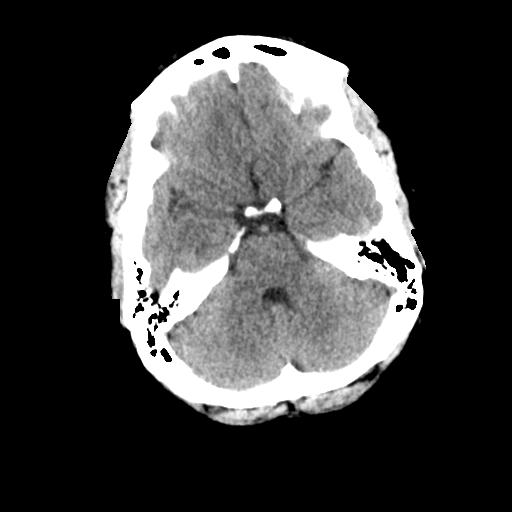
[im 13/33  brain]
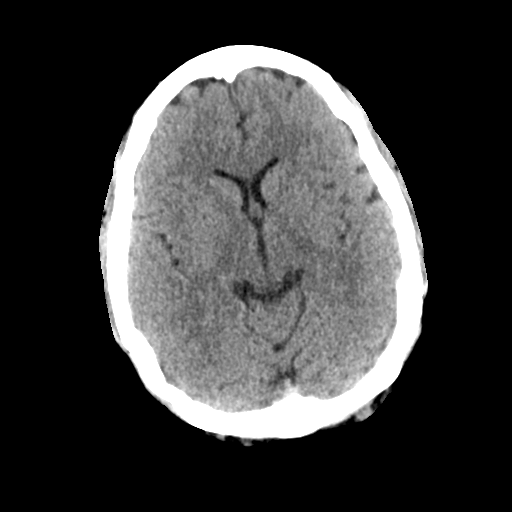
[im 17/33  brain]
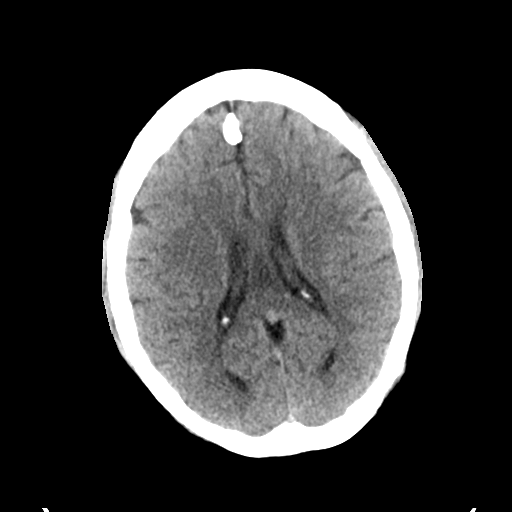
[im 21/33  brain]
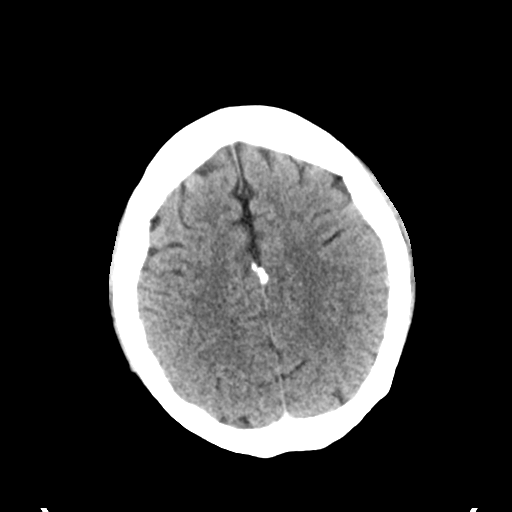
[im 21/33  bone]
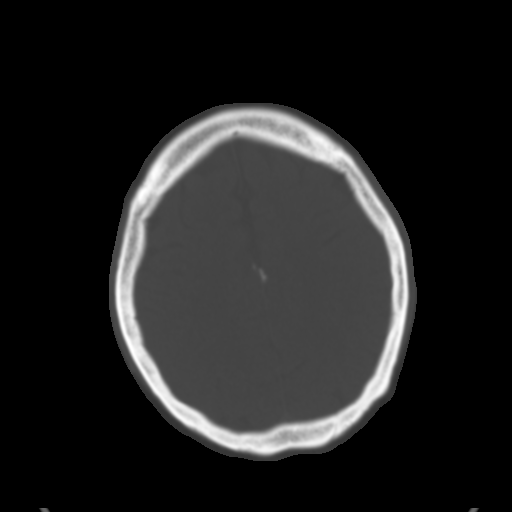
[im 25/33  brain]
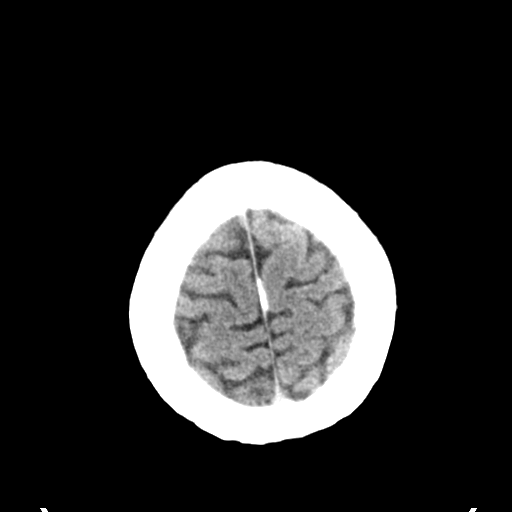
[im 29/33  brain]
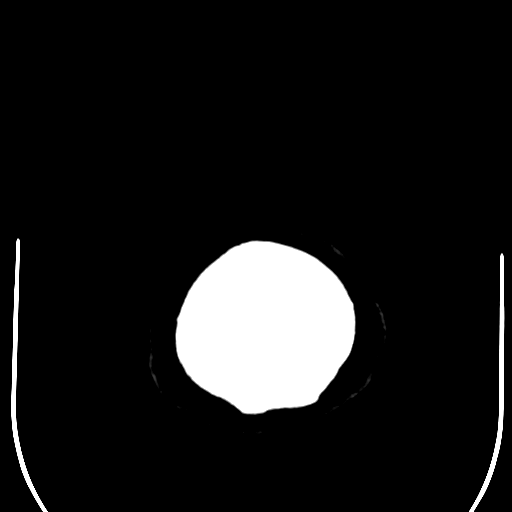

[Series 4: head bone · axial · 0.46mm/px · z∈[-91,-59]mm · 3 of 81 slices shown]
[im 9/81  bone]
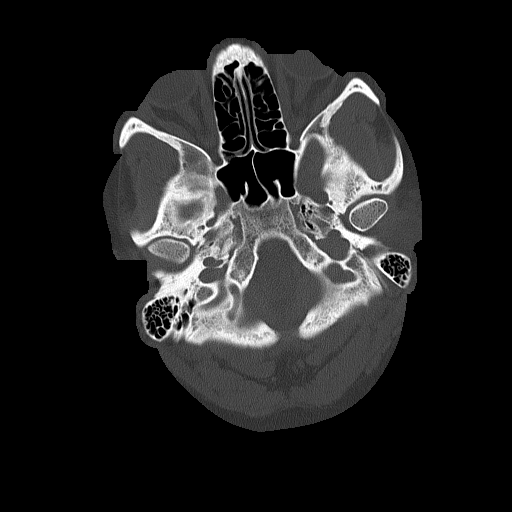
[im 17/81  bone]
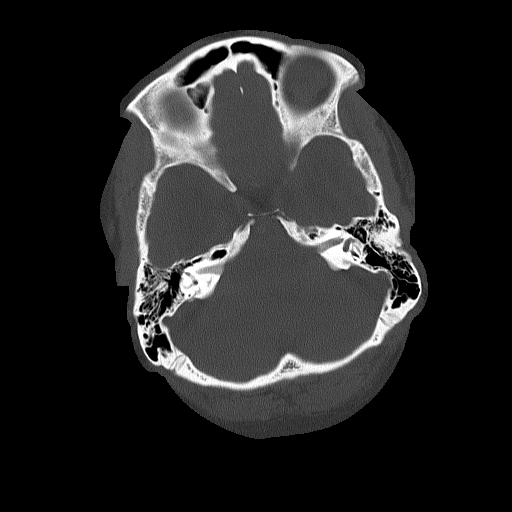
[im 25/81  bone]
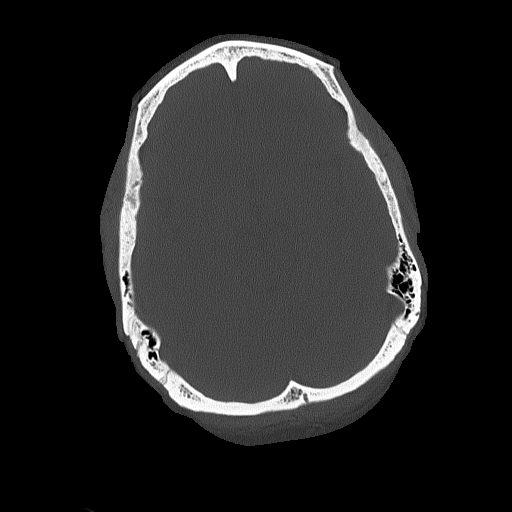

[Series 5: head without cor · coronal · non-contrast · 0.31mm/px · 3 of 67 slices shown]
[im 26/67  brain]
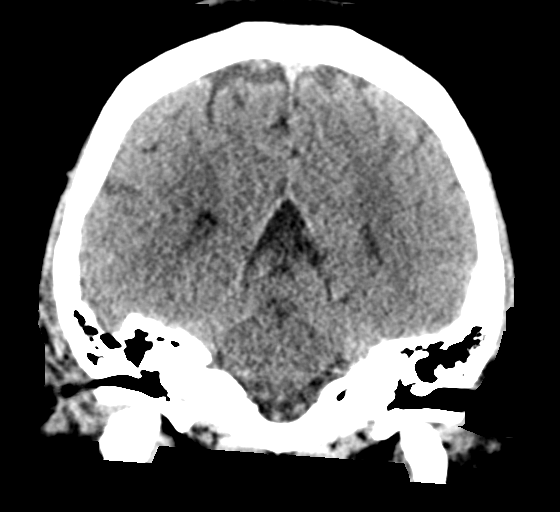
[im 31/67  brain]
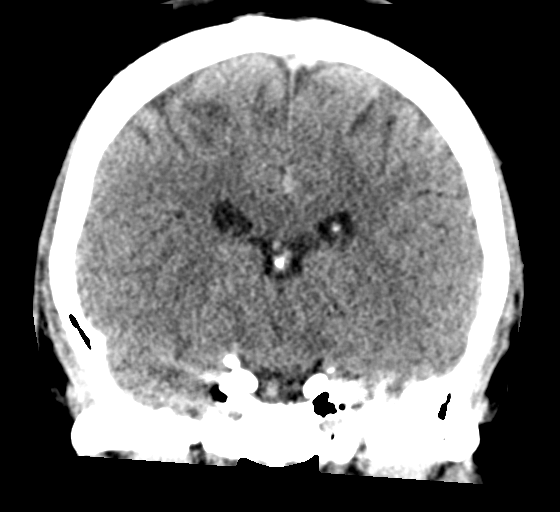
[im 36/67  brain]
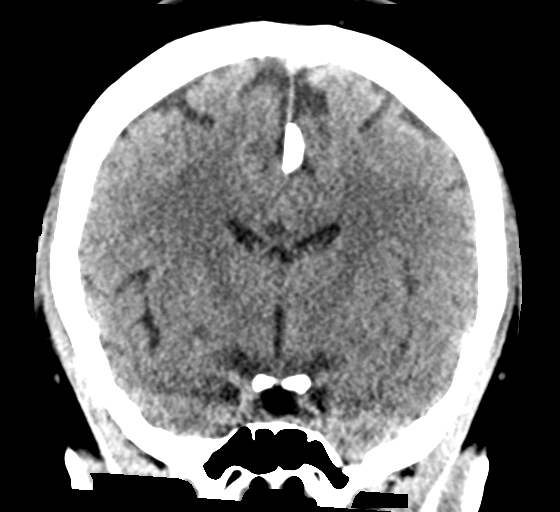

[Series 6: head without sag · sagittal · non-contrast · 0.32mm/px · 3 of 67 slices shown]
[im 23/67  brain]
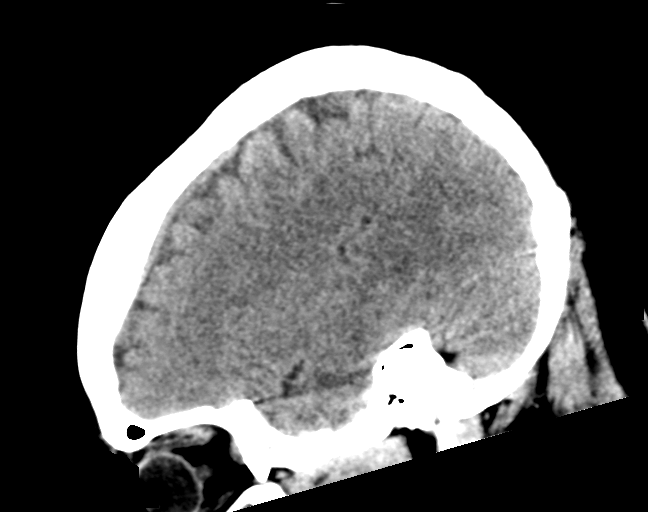
[im 34/67  brain]
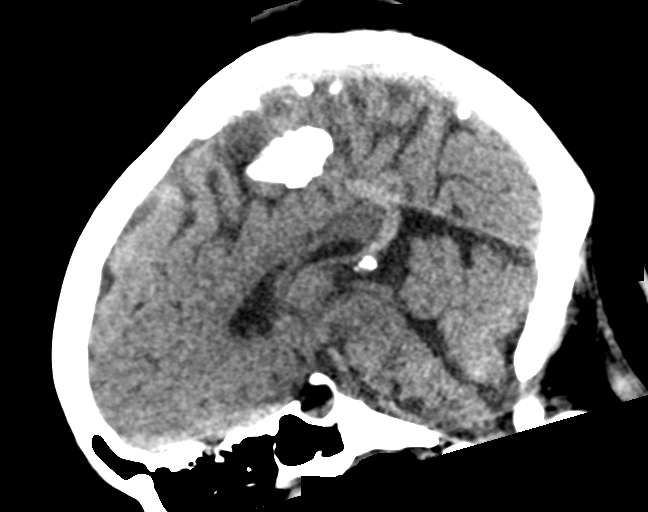
[im 45/67  brain]
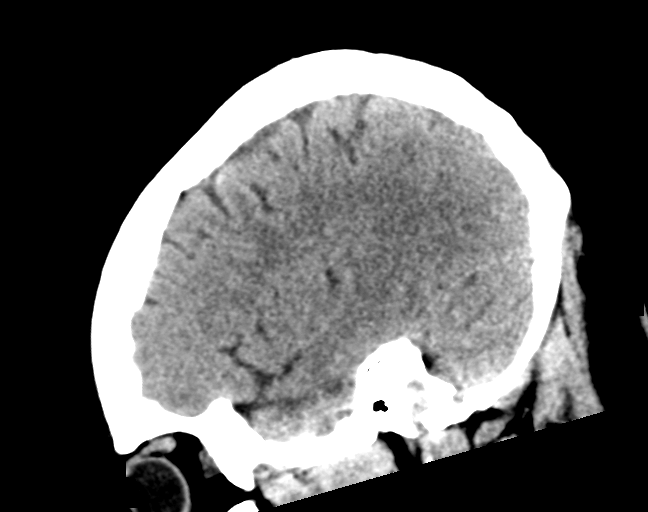

[16 of 47 positions shown; findings below may reference images not displayed]

FINDINGS: Brain: No evidence of parenchymal hemorrhage or extra-axial fluid
collection. No mass lesion, mass effect, or midline shift. No CT
evidence of acute infarction. Cerebral volume is age appropriate. No
ventriculomegaly.

Vascular: No acute abnormality.

Skull: No evidence of calvarial fracture.

Sinuses/Orbits: Partial opacification of the visualized maxillary
sinuses bilaterally. No appreciable fluid levels.

Other:  The mastoid air cells are unopacified.
IMPRESSION: 1.  No evidence of acute intracranial abnormality.
2. Paranasal sinusitis, probably chronic.

## 2019-06-10 ENCOUNTER — Emergency Department (HOSPITAL_COMMUNITY)
Admission: EM | Admit: 2019-06-10 | Discharge: 2019-06-10 | Disposition: A | Discharge status: Home / Self Care | Payer: Medicaid Other | Attending: Emergency Medicine | Admitting: Emergency Medicine

## 2019-06-10 ENCOUNTER — Encounter (HOSPITAL_COMMUNITY): Payer: Self-pay

## 2019-06-10 ENCOUNTER — Other Ambulatory Visit: Payer: Self-pay

## 2019-06-10 DIAGNOSIS — I10 Essential (primary) hypertension: Secondary | ICD-10-CM | POA: Insufficient documentation

## 2019-06-10 DIAGNOSIS — R030 Elevated blood-pressure reading, without diagnosis of hypertension: Secondary | ICD-10-CM

## 2019-06-10 DIAGNOSIS — Z79899 Other long term (current) drug therapy: Secondary | ICD-10-CM | POA: Insufficient documentation

## 2019-06-10 DIAGNOSIS — R109 Unspecified abdominal pain: Secondary | ICD-10-CM

## 2019-06-10 DIAGNOSIS — Z85850 Personal history of malignant neoplasm of thyroid: Secondary | ICD-10-CM | POA: Insufficient documentation

## 2019-06-10 DIAGNOSIS — R1031 Right lower quadrant pain: Secondary | ICD-10-CM | POA: Diagnosis not present

## 2019-06-10 LAB — CBC
HCT: 37.6 % (ref 36.0–46.0)
Hemoglobin: 12.2 g/dL (ref 12.0–15.0)
MCH: 30.9 pg (ref 26.0–34.0)
MCHC: 32.4 g/dL (ref 30.0–36.0)
MCV: 95.2 fL (ref 80.0–100.0)
Platelets: 257 K/uL (ref 150–400)
RBC: 3.95 MIL/uL (ref 3.87–5.11)
RDW: 13.1 % (ref 11.5–15.5)
WBC: 10 K/uL (ref 4.0–10.5)
nRBC: 0 % (ref 0.0–0.2)

## 2019-06-10 LAB — URINALYSIS, ROUTINE W REFLEX MICROSCOPIC
Bacteria, UA: NONE SEEN
Bilirubin Urine: NEGATIVE
Glucose, UA: NEGATIVE mg/dL
Hgb urine dipstick: NEGATIVE
Ketones, ur: NEGATIVE mg/dL
Nitrite: NEGATIVE
Protein, ur: NEGATIVE mg/dL
Specific Gravity, Urine: 1.024 (ref 1.005–1.030)
WBC, UA: >50 WBC/hpf — ABNORMAL HIGH (ref 0–5)
pH: 5 (ref 5.0–8.0)

## 2019-06-10 LAB — COMPREHENSIVE METABOLIC PANEL
ALT: 11 U/L (ref 0–44)
AST: 18 U/L (ref 15–41)
Albumin: 4.2 g/dL (ref 3.5–5.0)
Alkaline Phosphatase: 63 U/L (ref 38–126)
Anion gap: 9 (ref 5–15)
BUN: 15 mg/dL (ref 6–20)
CO2: 24 mmol/L (ref 22–32)
Calcium: 9 mg/dL (ref 8.9–10.3)
Chloride: 108 mmol/L (ref 98–111)
Creatinine, Ser: 0.89 mg/dL (ref 0.44–1.00)
GFR calc Af Amer: >60 mL/min (ref >60)
GFR calc non Af Amer: >60 mL/min (ref >60)
Glucose, Bld: 101 mg/dL — ABNORMAL HIGH (ref 70–99)
Potassium: 3.8 mmol/L (ref 3.5–5.1)
Sodium: 141 mmol/L (ref 135–145)
Total Bilirubin: 0.7 mg/dL (ref 0.3–1.2)
Total Protein: 7.5 g/dL (ref 6.5–8.1)

## 2019-06-10 LAB — LIPASE, BLOOD: Lipase: 28 U/L (ref 11–51)

## 2019-06-10 LAB — I-STAT BETA HCG BLOOD, ED (MC, WL, AP ONLY): I-stat hCG, quantitative: <5 m[IU]/mL (ref <5)

## 2019-06-10 MED ORDER — CYCLOBENZAPRINE HCL 10 MG PO TABS
10.0000 mg | ORAL_TABLET | Freq: Once | ORAL | Status: AC
  Administered 2019-06-10: 10 mg via ORAL
  Filled 2019-06-10: qty 1

## 2019-06-10 MED ORDER — TIZANIDINE HCL 4 MG PO TABS: 4.0000 mg | ORAL_TABLET | Freq: Four times a day (QID) | ORAL | 0 refills | Status: DC | PRN

## 2019-06-10 MED ORDER — ALBUTEROL SULFATE HFA 108 (90 BASE) MCG/ACT IN AERS: 1.0000 | INHALATION_SPRAY | Freq: Four times a day (QID) | RESPIRATORY_TRACT | 0 refills | Status: DC | PRN

## 2019-06-10 MED ORDER — SODIUM CHLORIDE 0.9% FLUSH: 3.0000 mL | Freq: Once | INTRAVENOUS | Status: DC

## 2019-06-10 MED ORDER — KETOROLAC TROMETHAMINE 30 MG/ML IJ SOLN
30.0000 mg | Freq: Once | INTRAMUSCULAR | Status: AC
  Administered 2019-06-10: 21:00:00 30 mg via INTRAVENOUS
  Filled 2019-06-10: qty 1

## 2019-06-10 NOTE — ED Notes (Signed)
Provider made aware of BP.

## 2019-06-10 NOTE — ED Triage Notes (Signed)
Patient c/o right lower abdominal pain X 4 hours.   10/10 sharp/achy  Patient reports having a panic attack this morning with 1 occurrences of nausea and vomiting.    A/Ox4 Ambulatory in triage.

## 2019-06-10 NOTE — ED Provider Notes (Addendum)
[facility] DEPT Provider Note   CSN: [id number] Arrival date & time: 06/10/19  1546     History   Chief Complaint Chief Complaint  Patient presents with   Abdominal Pain    HPI [name] is a 33 y.o. female     HPI  Patient complains of right sided abdominal pain that she had 1 occurrence of yesterday after a coughing spell and then became more constant today but is worse with coughing, bending over and lifting objects.  States that she was at [employer] walking today and it felt particularly achy.  Patient describes pain as sharp, nonradiating has not migrated.  Patient states she is hungry currently and has had no decrease in appetite, no changes in bowel movements, endorses brief period of nausea this morning when she had a panic attack which was brief and similar to prior panic attacks.  Denies any other nausea or vomiting.  States she has had persistent cough for the past 2 days which she states has been primary exacerbate her of her right-sided pain.  Denies any congestion, change in taste or smell denies any Covid exposure.  Patient states that she recently had a ultrasound and states that there were no abnormalities she has no history of cysts.  Only abdominal surgeries are 2 C-sections.  Patient denies any current nausea, fever, chills, anorexia, diarrhea, constipation.  Any vaginal discharge, concern for STI, pelvic pain, dysuria, frequency, urgency  Past Medical History:  Diagnosis Date   Anxiety    Cancer ([place]) 02/2012   thyroid cancer   Hypertension    Thyroid disease    cancer    Patient Active Problem List   Diagnosis Date Noted   Preeclampsia in postpartum period 07/08/2018   Preeclampsia 07/07/2018   Severe Pre-eclampsia superimposed on chronic hypertension, delivered 07/01/2018   Substance abuse affecting pregnancy in third trimester, antepartum 05/18/2018   Obesity, Class III, BMI 40-49.9 (morbid obesity)  ([place]) 12/30/2017   Obesity affecting pregnancy, antepartum 12/30/2017   Hypothyroidism during pregnancy, antepartum 12/30/2017   Supervision of high risk pregnancy, antepartum 12/16/2017   Chronic hypertension in pregnancy 12/16/2017   History of cesarean delivery 12/16/2017   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   Hematuria 11/11/2017   Hypertension    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     OB History    Gravida  3   Para  2   Term  2   Preterm      AB  1   Living  2     SAB  1   TAB      Ectopic      Multiple  0   Live Births  2            Home Medications    Prior to Admission medications   Medication Sig Start Date End Date Taking? Authorizing Provider  albuterol (VENTOLIN HFA) 108 (90 Base) MCG/ACT inhaler Inhale 1-2  puffs into the lungs every 6 (six) hours as needed for wheezing or shortness of breath. 06/10/19   Tedd Sias, PA  docusate sodium (COLACE) 100 MG capsule Take 1 capsule (100 mg total) by mouth 2 (two) times daily as needed. Patient taking differently: Take 100 mg by mouth 2 (two) times daily as needed for mild constipation.  05/17/18   Donnamae Jude, MD  enalapril (VASOTEC) 20 MG tablet Take 1 tablet (20 mg total) by mouth 2 (two) times daily. 07/11/18   Jonnie Kind, MD  hydrochlorothiazide (HYDRODIURIL) 25 MG tablet Take 1 tablet (25 mg total) by mouth daily for 30 doses. 07/16/18  08/15/18  Truett Mainland, DO  labetalol (NORMODYNE) 200 MG tablet Take 4 tablets (800 mg total) by mouth 3 (three) times daily. 07/16/18 08/15/18  Truett Mainland, DO  levothyroxine (SYNTHROID, LEVOTHROID) 125 MCG tablet Take 2 tablets (250 mcg total) by mouth daily at 6 (six) AM. 07/06/18   Anyanwu, Sallyanne Havers, MD  metroNIDAZOLE (FLAGYL) 500 MG tablet Take 1 tablet (500 mg total) by mouth 2 (two) times daily. 07/12/18   Sloan Leiter, MD  tiZANidine (ZANAFLEX) 4 MG tablet Take 1 tablet (4 mg total) by mouth every 6 (six) hours as needed for muscle spasms. 06/10/19   Tedd Sias, PA    Family History Family History  Problem Relation Age of Onset   Hypertension Mother    Blindness Mother        one eye    Social History Social History   Tobacco Use   Smoking status: Never Smoker   Smokeless tobacco: Never Used  Substance Use Topics   Alcohol use: No   Drug use: Yes    Types: Marijuana    Comment: last used 05/19/18     Allergies   Hydrocodone   Review of Systems Review of Systems  Constitutional: Negative for chills and fever.  HENT: Negative for congestion.   Eyes: Negative for pain.  Respiratory: Positive for cough. Negative for shortness of breath.   Cardiovascular: Negative for chest pain.  Gastrointestinal: Positive for abdominal pain (Right flank and right lower quadrant). Negative for blood in stool, constipation and diarrhea.  Endocrine: Negative for polydipsia and polyuria.  Genitourinary: Negative for difficulty urinating, dysuria and hematuria.  Musculoskeletal: Negative for myalgias.  Neurological: Negative for dizziness and headaches.     Physical Exam Updated Vital Signs BP (!) 194/108    Pulse 79    Temp 98.8 F (37.1 C)    Resp 18    Ht 5\' 1"  (1.549 m)    Wt 125 kg    SpO2 100%    BMI 52.07 kg/m   Physical Exam Vitals signs and nursing note reviewed.  Constitutional:      Appearance: She is not ill-appearing.  HENT:     Head:  Normocephalic and atraumatic.     Mouth/Throat:     Mouth: Mucous membranes are moist.  Eyes:     General: No scleral icterus. Neck:     Musculoskeletal: No neck rigidity.  Cardiovascular:     Rate and Rhythm: Normal rate and regular rhythm.     Pulses: Normal pulses.     Heart sounds: Normal heart sounds.  Pulmonary:     Effort: Pulmonary effort is normal.     Breath sounds: Normal breath sounds.  Abdominal:     General: Abdomen is flat.     Palpations: Abdomen is soft.     Tenderness:  There is no abdominal tenderness.     Comments: Patient has soft, protuberant, obese abdomen.  No focal tenderness.  Generalized tenderness on the right side of the abdomen and flank.  Positive Carnett's sign.  Negative McBurney, obturator, psoas, Rovsing.  No focal right lower quadrant tenderness.   Musculoskeletal:     Right lower leg: No edema.     Left lower leg: No edema.     Comments: Patient able to ambulate without pain.  Jump up and down at bedside.  States some right-sided pain with bending over, twisting and jumping.  Skin:    General: Skin is warm and dry.     Capillary Refill: Capillary refill takes less than 2 seconds.  Neurological:     Mental Status: She is alert. Mental status is at baseline.  Psychiatric:        Behavior: Behavior normal.      ED Treatments / Results  Labs (all labs ordered are listed, but only abnormal results are displayed) Labs Reviewed  COMPREHENSIVE METABOLIC PANEL - Abnormal; Notable for the following components:      Result Value   Glucose, Bld 101 (*)    All other components within normal limits  URINALYSIS, ROUTINE W REFLEX MICROSCOPIC - Abnormal; Notable for the following components:   APPearance HAZY (*)    Leukocytes,Ua LARGE (*)    WBC, UA >50 (*)    All other components within normal limits  LIPASE, BLOOD  CBC  I-STAT BETA HCG BLOOD, ED ([facility], [facility], [facility] ONLY)    EKG None  Radiology No results found.  Procedures Procedures  (including critical care time)  Medications Ordered in ED Medications  sodium chloride flush (NS) 0.9 % injection 3 mL (has no administration in time range)  cyclobenzaprine (FLEXERIL) tablet 10 mg (10 mg Oral Given 06/10/19 2033)  ketorolac (TORADOL) 30 MG/ML injection 30 mg (30 mg Intravenous Given 06/10/19 2034)     Initial Impression / Assessment and Plan / ED Course  I have reviewed the triage vital signs and the nursing notes.  Pertinent labs & imaging results that were available during my care of the patient were reviewed by me and considered in my medical decision making (see chart for details).        Patient with right lower quadrant and flank pain.  Patient is afebrile, denies migration of pain, has no focal right lower quadrant tenderness to palpation, no leukocytosis, neutrophil predominant, no peritoneal signs, exam without concerning signs for appendicitis.  Carnett's sign positive most likely this is musculoskeletal.  Had shared decision making discussion with patient.  Patient agreeable to discharge with muscle relaxers and return if symptoms worsen or persist.  Doubt appendicitis as discussed above.  Doubt ovarian pathology as patient had recent ultrasound, no history of cysts, pain is not pelvic and is more right-sided flank and right lower abdominal.  Pain is worse with active motion and consistent with musculoskeletal pain.  Patient recollects coughing hard before the first time she experienced pain.  Patient given return precautions including signs of appendicitis and ovarian torsion.  Patient also denies any urinary with UTI.  UA equivocal.  Doubt urinary tract infection.  Discussed with patient option of doing pelvic exam today patient deferred which is reasonable as low suspicion for STI and patient's pain is more flank/abdominal.  All labs reviewed interpreted by me.  Patient able to tolerate p.o. and states she has healthy appetite and would like to go home and eat.  Patient given Toradol, muscle relaxer and prescribed muscle relaxer with albuterol inhaler which she requested due to her frequent coughing.  Also suggested over-the-counter Mucinex.  This patient presents with abdominal pain of unclear etiology. Their evaluation has not identified a emergent etiology for the abdominal pain. Specifically, given the very benign exam, normal laboratory studies, and lack of significant risk factors, I have a very low suspicion for appendicitis, ischemic bowel, bowel perforation, or any other life threatening disease. I have discussed with the patient the level of uncertainty with undifferentiated abdominal pain and clearly explained the need to follow-up as noted on the discharge instructions, or return to the Emergency Department immediately if the pain worsens, develops fever, persistent and uncontrollable vomiting, or for any new symptoms or concerns. I discussed with the patient that this presentation today for abdominal pain could represent a significant risk for an acute abdominal process. Although the tests in the ED were essentially normal, there is still a possibility of a process such as appendicitis, diverticulitis, cholecystitis, ulcer, early bowel obstruction, mesenteric ischemia, kidney stone, or even kidney infection which could subsequently cause disability or death. The patient understands that they must return within 24 hours for a recheck or see their physician within 24 hours for re-exam due to the possibility of significant surgical or medical process.   Patient has elevated blood pressure at time of discharge however states that she feels improved symptoms from before.  Initial blood pressure in triage likely underestimated blood pressure.  Patient states she has a history of elevated BP and states she will have it rechecked at her PCP appointment which she states she will attend soon.  Denies any chest pain, shortness of breath, headache, dizziness  vision changes or other concerning symptoms at this time.  Feels well to go home.  Discussed with patient their elevated blood pressure and need for close outpatient management of their hypertension. Patient counseled on long-term effects of elevated BP including kidney damage, vascular damage, retinopathy, and risk of stroke and other dangerous outcomes. Patient understanding of close PCP follow up.    Final Clinical Impressions(s) / ED Diagnoses   Final diagnoses:  Right lower quadrant abdominal pain  Flank pain    ED Discharge Orders         Ordered    tiZANidine (ZANAFLEX) 4 MG tablet  Every 6 hours PRN     06/10/19 2036    albuterol (VENTOLIN HFA) 108 (90 Base) MCG/ACT inhaler  Every 6 hours PRN     06/10/19 2036           Tedd Sias, [place] 06/10/19 2039    Tedd Sias, [place] 06/10/19 2041    Tedd Sias, [place] 06/11/19 Antony Madura    Dorie Rank, MD 06/12/19 1122

## 2019-06-10 NOTE — Discharge Instructions (Signed)
Return immediately to ED if you have new or worsening pain.  Your examination today is most concerning for a muscular injury 1. Medications: alternate ibuprofen and tylenol for pain control, take all usual home medications as they are prescribed  If your symptoms do not improve please follow up with orthopedics/sports medicine or your PCP for discussion of your diagnoses and further evaluation after today's visit; if you do not have a primary care doctor use the resource guide provided to find one; Please return to the ER for worsening symptoms or other concerns.

## 2019-09-08 IMAGING — US US MFM OB DETAIL+14 WK
1 series · 13 of 28 positions shown · non-contrast
Comparison: none

[Series 1: us mfm ob detail+14 wk · 13 of 145 slices shown]
[im 6/145]
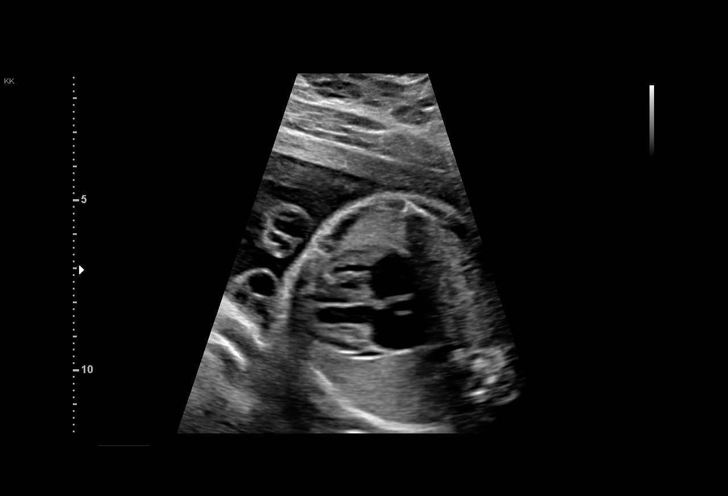
[im 17/145]
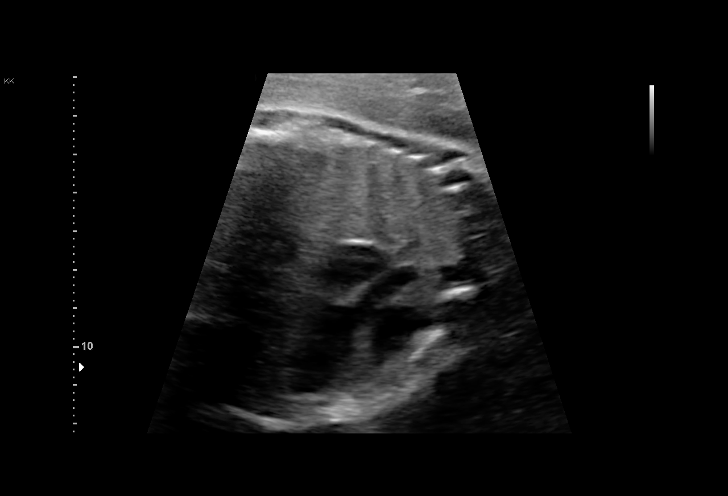
[im 27/145]
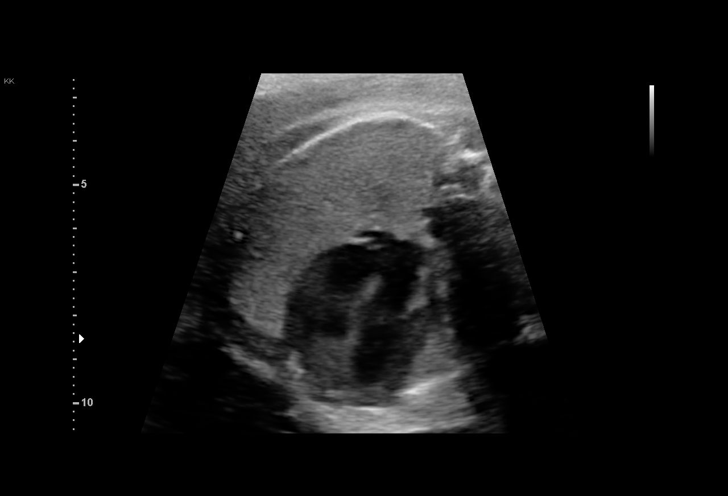
[im 38/145]
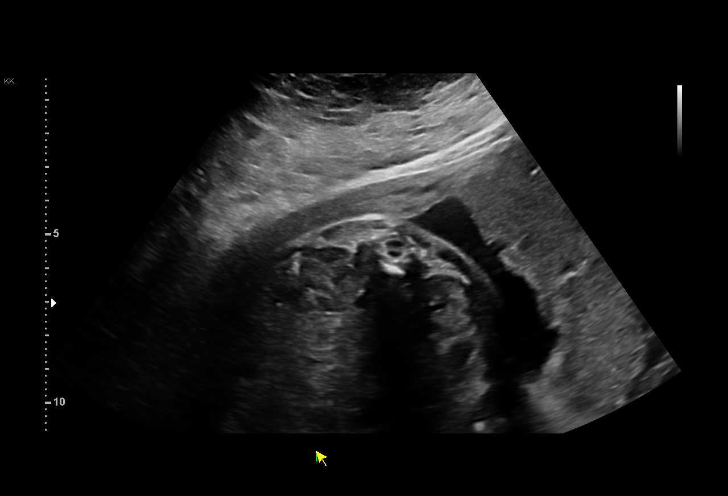
[im 49/145]
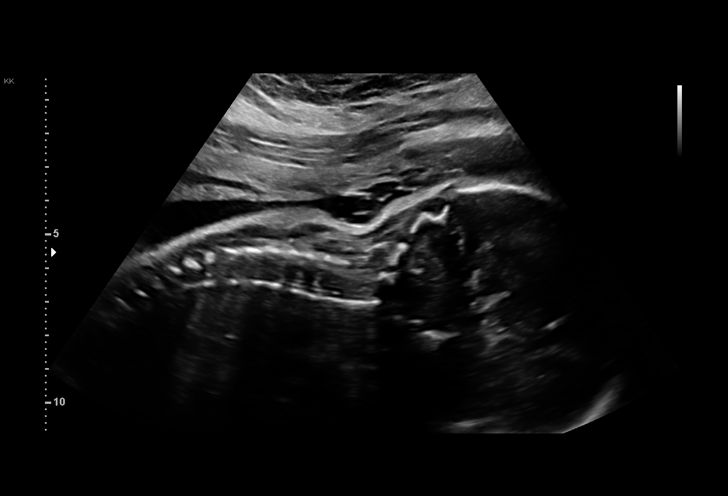
[im 59/145]
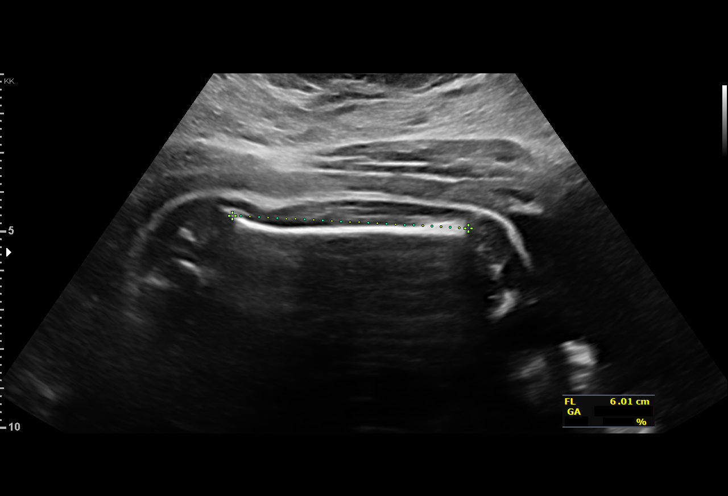
[im 75/145]
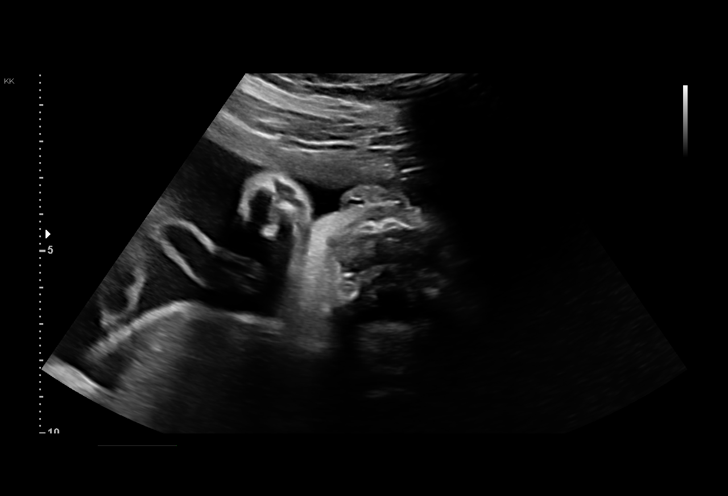
[im 86/145]
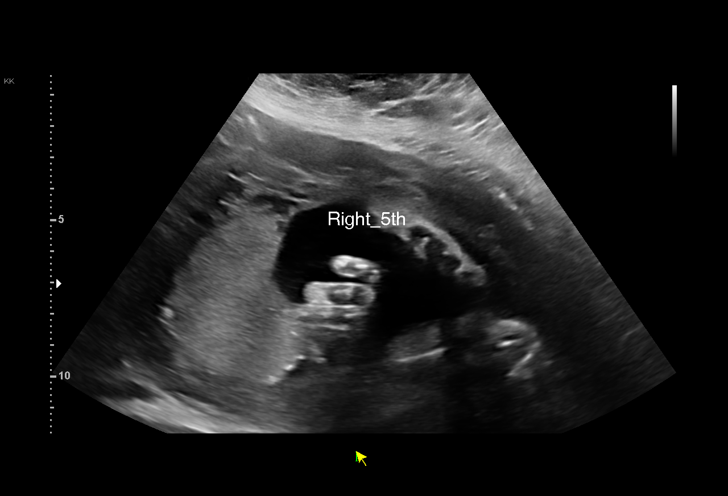
[im 97/145]
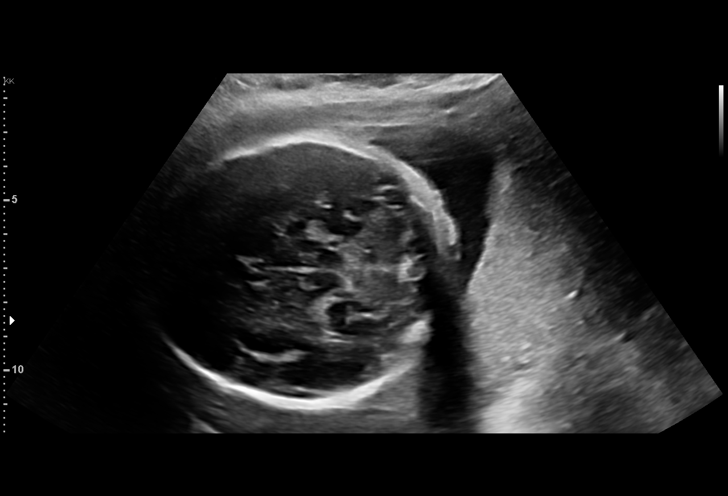
[im 107/145]
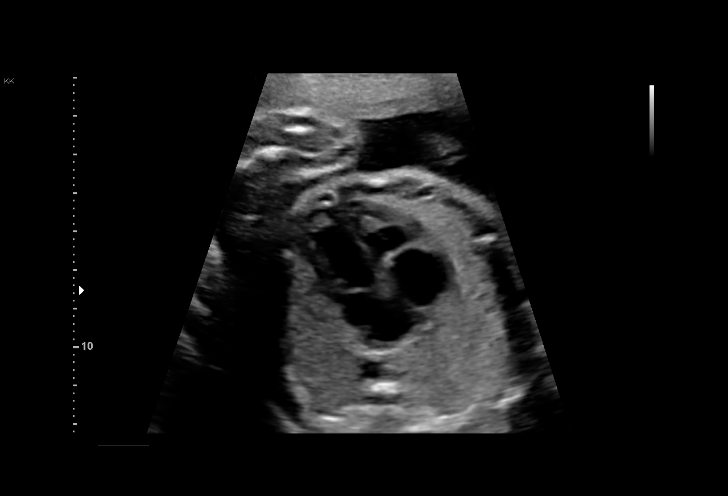
[im 118/145]
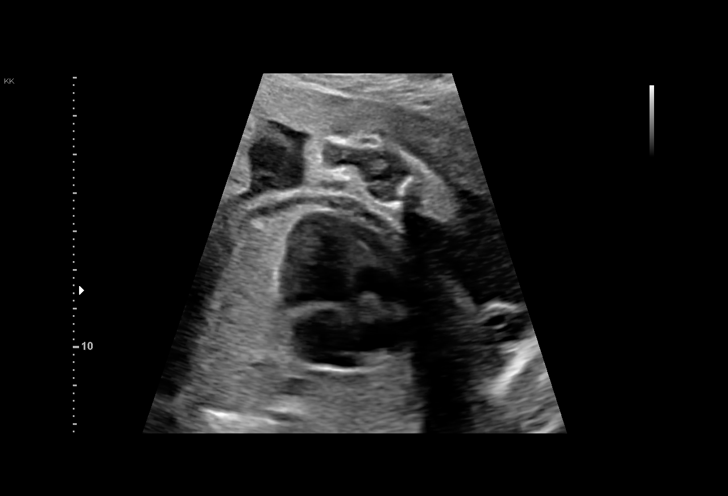
[im 129/145]
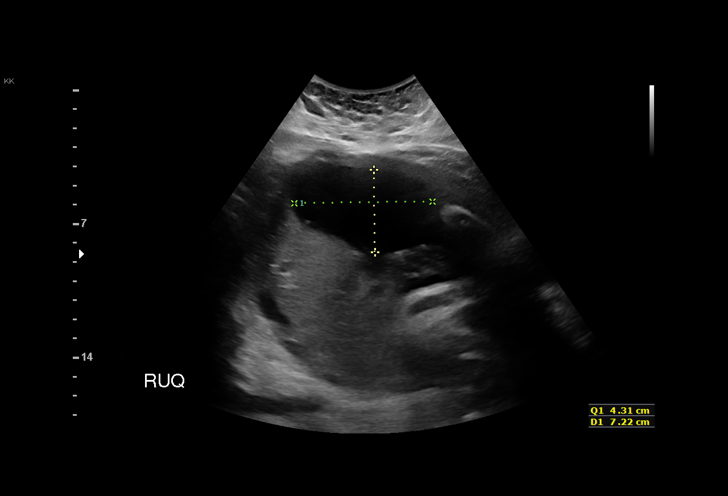
[im 139/145]
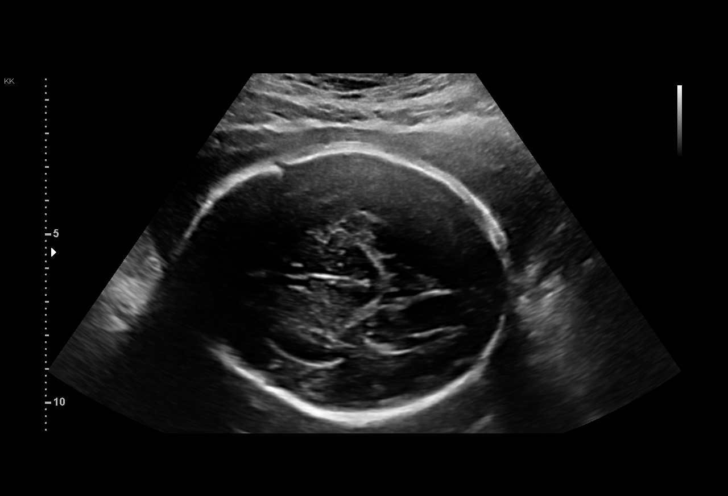

[13 of 28 positions shown; findings below may reference images not displayed]

Indications

Hypertension - Chronic/Pre-existing
Obesity complicating pregnancy, third
trimester
Hypothyroid (S/P Thyroidectomy due to CA)
Encounter for antenatal screening for
malformations
History of cesarean delivery, currently
pregnant
31 weeks gestation of pregnancy
Vital Signs

BMI:
Fetal Evaluation

Num Of Fetuses:         1
Fetal Heart Rate(bpm):  138
Cardiac Activity:       Observed
Presentation:           Cephalic
Placenta:               Posterior Fundal
P. Cord Insertion:      Not well visualized

Amniotic Fluid
AFI FV:      Within normal limits

AFI Sum(cm)     %Tile       Largest Pocket(cm)
16.4            59

RUQ(cm)       RLQ(cm)       LUQ(cm)        LLQ(cm)
4.31
Biophysical Evaluation

Amniotic F.V:   Within normal limits       F. Tone:        Observed
F. Movement:    Observed                   Score:          [DATE]
F. Breathing:   Observed
Biometry

BPD:      78.6  mm     G. Age:  31w 4d         39  %    CI:        76.98   %    70 - 86
FL/HC:      21.2   %    19.1 -
HC:      283.7  mm     G. Age:  31w 1d          9  %    HC/AC:      1.03        0.96 -
AC:      275.6  mm     G. Age:  31w 5d         49  %    FL/BPD:     76.5   %    71 - 87
FL:       60.1  mm     G. Age:  31w 2d         29  %    FL/AC:      21.8   %    20 - 24

Est. FW:    9559  gm    3 lb 14 oz      54  %
OB History

Gravidity:    3         Term:   1        Prem:   0        SAB:   1
TOP:          0       Ectopic:  0        Living: 1
Gestational Age

LMP:           31w 4d        Date:  10/08/17                 EDD:   07/15/18
U/S Today:     31w 3d                                        EDD:   07/16/18
Best:          31w 4d     Det. By:  LMP  (10/08/17)          EDD:   07/15/18
Anatomy

Cranium:               Appears normal         Aortic Arch:            Not well visualized
Cavum:                 Appears normal         Ductal Arch:            Appears normal
Ventricles:            Appears normal         Diaphragm:              Appears normal
Choroid Plexus:        Appears normal         Stomach:                Appears normal, left
sided
Cerebellum:            Appears normal         Abdomen:                Appears normal
Posterior Fossa:       Appears normal         Abdominal Wall:         Not well visualized
Nuchal Fold:           Not applicable (>20    Cord Vessels:           Appears normal (3
wks GA)                                        vessel cord)
Face:                  Orbits appear          Kidneys:                Appear normal
normal
Lips:                  Appears normal         Bladder:                Appears normal
Thoracic:              Appears normal         Spine:                  Appears normal
Heart:                 Appears normal         Upper Extremities:      Visualized
(4CH, axis, and situs
RVOT:                  Appears normal         Lower Extremities:      Visualized
LVOT:                  Appears normal

Other:  Heels and Nasal bone visualized. Technically difficult due to maternal
habitus and fetal position. Technically difficult due to advanced
gestational age.
Cervix Uterus Adnexa

Cervix
Measured transvaginally.
Comments

.U/S images reviewed. Findings reviewed with patient.
Appropriate fetal growth is noted.  No fetal abnormalities are
seen.  No evidence of fetal compromise is found on U/S
today.
BP - 130/96.  Patient denies headaches, visual disturbance,
mid-epigastric or RUQ pain.   She is currently taking
Nifedipine XL which has been increased to 90 mg today.
Questions answered.
10 minutes spent face to face with patient.
Recommendations: 1) Serial U/S every 4 weeks for fetal
growth 2) Weekly BPP
Recommendations

1) Serial U/S every 4 weeks for fetal growth 2) Weekly BPP

## 2019-10-16 ENCOUNTER — Inpatient Hospital Stay (HOSPITAL_BASED_OUTPATIENT_CLINIC_OR_DEPARTMENT_OTHER): Payer: Medicaid Other

## 2019-10-16 ENCOUNTER — Other Ambulatory Visit: Payer: Self-pay

## 2019-10-16 ENCOUNTER — Encounter (HOSPITAL_COMMUNITY): Payer: Self-pay | Admitting: Obstetrics and Gynecology

## 2019-10-16 ENCOUNTER — Inpatient Hospital Stay (HOSPITAL_COMMUNITY)
Admission: AD | Admit: 2019-10-16 | Discharge: 2019-10-16 | Disposition: A | Discharge status: Home / Self Care | Payer: Medicaid Other | Attending: Obstetrics and Gynecology | Admitting: Obstetrics and Gynecology

## 2019-10-16 DIAGNOSIS — I10 Essential (primary) hypertension: Secondary | ICD-10-CM

## 2019-10-16 DIAGNOSIS — Z3A16 16 weeks gestation of pregnancy: Secondary | ICD-10-CM

## 2019-10-16 DIAGNOSIS — O09292 Supervision of pregnancy with other poor reproductive or obstetric history, second trimester: Secondary | ICD-10-CM | POA: Diagnosis not present

## 2019-10-16 DIAGNOSIS — O99282 Endocrine, nutritional and metabolic diseases complicating pregnancy, second trimester: Secondary | ICD-10-CM | POA: Diagnosis not present

## 2019-10-16 DIAGNOSIS — O0932 Supervision of pregnancy with insufficient antenatal care, second trimester: Secondary | ICD-10-CM

## 2019-10-16 DIAGNOSIS — Z85850 Personal history of malignant neoplasm of thyroid: Secondary | ICD-10-CM | POA: Diagnosis not present

## 2019-10-16 DIAGNOSIS — O10912 Unspecified pre-existing hypertension complicating pregnancy, second trimester: Secondary | ICD-10-CM | POA: Diagnosis not present

## 2019-10-16 DIAGNOSIS — Z3A00 Weeks of gestation of pregnancy not specified: Secondary | ICD-10-CM

## 2019-10-16 DIAGNOSIS — O219 Vomiting of pregnancy, unspecified: Secondary | ICD-10-CM

## 2019-10-16 DIAGNOSIS — Z3687 Encounter for antenatal screening for uncertain dates: Secondary | ICD-10-CM

## 2019-10-16 DIAGNOSIS — Z3490 Encounter for supervision of normal pregnancy, unspecified, unspecified trimester: Secondary | ICD-10-CM

## 2019-10-16 DIAGNOSIS — O132 Gestational [pregnancy-induced] hypertension without significant proteinuria, second trimester: Secondary | ICD-10-CM

## 2019-10-16 DIAGNOSIS — O10012 Pre-existing essential hypertension complicating pregnancy, second trimester: Secondary | ICD-10-CM | POA: Insufficient documentation

## 2019-10-16 DIAGNOSIS — E079 Disorder of thyroid, unspecified: Secondary | ICD-10-CM

## 2019-10-16 LAB — CBC
HCT: 36.6 % (ref 36.0–46.0)
Hemoglobin: 12.5 g/dL (ref 12.0–15.0)
MCH: 30.9 pg (ref 26.0–34.0)
MCHC: 34.2 g/dL (ref 30.0–36.0)
MCV: 90.6 fL (ref 80.0–100.0)
Platelets: 240 10*3/uL (ref 150–400)
RBC: 4.04 MIL/uL (ref 3.87–5.11)
RDW: 12.7 % (ref 11.5–15.5)
WBC: 12.9 10*3/uL — ABNORMAL HIGH (ref 4.0–10.5)
nRBC: 0 % (ref 0.0–0.2)

## 2019-10-16 LAB — COMPREHENSIVE METABOLIC PANEL
ALT: 10 U/L (ref 0–44)
AST: 13 U/L — ABNORMAL LOW (ref 15–41)
Albumin: 3.5 g/dL (ref 3.5–5.0)
Alkaline Phosphatase: 56 U/L (ref 38–126)
Anion gap: 12 (ref 5–15)
BUN: 8 mg/dL (ref 6–20)
CO2: 19 mmol/L — ABNORMAL LOW (ref 22–32)
Calcium: 9.5 mg/dL (ref 8.9–10.3)
Chloride: 105 mmol/L (ref 98–111)
Creatinine, Ser: 0.59 mg/dL (ref 0.44–1.00)
GFR calc Af Amer: >60 mL/min (ref >60)
GFR calc non Af Amer: >60 mL/min (ref >60)
Glucose, Bld: 102 mg/dL — ABNORMAL HIGH (ref 70–99)
Potassium: 3.8 mmol/L (ref 3.5–5.1)
Sodium: 136 mmol/L (ref 135–145)
Total Bilirubin: 0.6 mg/dL (ref 0.3–1.2)
Total Protein: 6.6 g/dL (ref 6.5–8.1)

## 2019-10-16 LAB — POCT PREGNANCY, URINE: Preg Test, Ur: POSITIVE — AB

## 2019-10-16 LAB — PROTEIN / CREATININE RATIO, URINE
Creatinine, Urine: 203.88 mg/dL
Protein Creatinine Ratio: 0.07 mg/mg{Cre} (ref 0.00–0.15)
Total Protein, Urine: 15 mg/dL

## 2019-10-16 MED ORDER — LABETALOL HCL 200 MG PO TABS: 400.0000 mg | ORAL_TABLET | Freq: Two times a day (BID) | ORAL | 0 refills | Status: DC

## 2019-10-16 MED ORDER — LABETALOL HCL 100 MG PO TABS
400.0000 mg | ORAL_TABLET | Freq: Once | ORAL | Status: AC
  Administered 2019-10-16: 400 mg via ORAL
  Filled 2019-10-16: qty 4

## 2019-10-16 MED ORDER — LABETALOL HCL 200 MG PO TABS: 400.0000 mg | ORAL_TABLET | Freq: Two times a day (BID) | ORAL | 1 refills | Status: DC

## 2019-10-16 NOTE — MAU Note (Addendum)
Pt recently found out she was pregnant by urgent care yesterday. Was told thyroid levels were low. Has a history of thyroid cancer and had a thyroidectomy in 2013. Suppose to be taking Levothyroxine, but has not been taking. Pt wants to make sure baby is okay. Pt denies vaginal bleeding abdominal pain. Pt states "I feel fine and there ain't nothing wrong with me." LMP: unsure because she has irregular periods. She thinks possibly around October.

## 2019-10-16 NOTE — Discharge Instructions (Signed)

## 2019-10-16 NOTE — MAU Provider Note (Signed)
Patient [name] is a 34 y.o. [id number]  at Unknown gestation here to "get checked out". Per patient, she went to [facility] yesterday for nausea and was told she was pregnant and that her thyroid level was extremely low.  She was discharged home with Zofran. She also says that her blood pressure was low at [facility].   She is s/p thyroid cancer and thyroidectomy in 2013. She takes synthroid 220 mcg daily but "not every day". She says that she "fell off" and hasn't been taking it regularly.   While in MAU triage, her BP was found to be 190-170/110s. She does not know her gestation. She is a known HTN not on medication. Chart review shows that she was prescribed enalapril and HCTZ on 07/2018 but did not take it. She took herself off the medicine because her "blood pressure" went down. She says that after she took herself off her blood pressure medicine it is always normal. She keeps checking her BP at [facility] and it is normal. She says that the BP medicine made her feel like her ears were "whooshing".   She denies headaches, blurry vision, floating spots, RUQ pain.    History     CSN: [id number]  Arrival date and time: 10/16/19 1925   None     No chief complaint on file.  Hypertension This is a chronic problem. The current episode started today. Pertinent negatives include no anxiety or shortness of breath. Past treatments include beta blockers, calcium channel blockers and diuretics. Compliance problems include medication side effects.     OB History    Gravida  3   Para  2   Term  2   Preterm      AB  1   Living  2     SAB  1   TAB      Ectopic      Multiple  0   Live Births  2           Past Medical History:  Diagnosis Date  . Anxiety   . Cancer ([place]) 02/2012   thyroid cancer  . Hypertension   . Preeclampsia in postpartum period 07/08/2018  . Severe Pre-eclampsia superimposed on chronic hypertension, delivered 07/01/2018  .  Thyroid disease    cancer    Past Surgical History:  Procedure Laterality Date  . CESAREAN SECTION  2006  . CESAREAN SECTION N/A 07/01/2018   Procedure: CESAREAN SECTION;  Surgeon: Sloan Leiter, MD;  Location: [facility];  Service: Obstetrics;  Laterality: N/A;  . DILATION AND EVACUATION N/A 02/07/2015   Procedure: DILATATION AND EVACUATION;  Surgeon: Lavonia Drafts, MD;  Location: [facility];  Service: Gynecology;  Laterality: N/A;  . THYROIDECTOMY  02/2012   total L side first and R side 7 days later.    Family History  Problem Relation Age of Onset  . Hypertension Mother   . Blindness Mother        one eye    Social History   Tobacco Use  . Smoking status: Never Smoker  . Smokeless tobacco: Never Used  Substance Use Topics  . Alcohol use: No  . Drug use: Yes    Types: Marijuana    Comment: last used 05/19/18    Allergies:  Allergies  Allergen Reactions  . Hydrocodone Nausea And Vomiting    Pt says she is not allergic, she stated allergy so the ED wouldn't medicate her.     Medications  Prior to Admission  Medication Sig Dispense Refill Last Dose  . albuterol (VENTOLIN HFA) 108 (90 Base) MCG/ACT inhaler Inhale 1-2 puffs into the lungs every 6 (six) hours as needed for wheezing or shortness of breath. 8 g 0   . docusate sodium (COLACE) 100 MG capsule Take 1 capsule (100 mg total) by mouth 2 (two) times daily as needed. (Patient taking differently: Take 100 mg by mouth 2 (two) times daily as needed for mild constipation. ) 30 capsule 2   . enalapril (VASOTEC) 20 MG tablet Take 1 tablet (20 mg total) by mouth 2 (two) times daily. 60 tablet 1   . hydrochlorothiazide (HYDRODIURIL) 25 MG tablet Take 1 tablet (25 mg total) by mouth daily for 30 doses. 30 tablet 0   . labetalol (NORMODYNE) 200 MG tablet Take 4 tablets (800 mg total) by mouth 3 (three) times daily. 360 tablet 0   . levothyroxine (SYNTHROID, LEVOTHROID) 125 MCG tablet Take 2 tablets (250 mcg total)  by mouth daily at 6 (six) AM. 60 tablet 2   . metroNIDAZOLE (FLAGYL) 500 MG tablet Take 1 tablet (500 mg total) by mouth 2 (two) times daily. 14 tablet 0   . tiZANidine (ZANAFLEX) 4 MG tablet Take 1 tablet (4 mg total) by mouth every 6 (six) hours as needed for muscle spasms. 30 tablet 0     Review of Systems  Constitutional: Negative.   HENT: Negative.   Respiratory: Negative for shortness of breath.   Genitourinary: Negative.   Musculoskeletal: Negative.   Neurological: Negative.   Psychiatric/Behavioral: Negative.    Physical Exam   Blood pressure (!) 177/112, pulse 97, temperature 98.1 F (36.7 C), temperature source Oral, resp. rate 20, height 5\' 1"  (1.549 m), weight 126.6 kg, SpO2 100 %, unknown if currently breastfeeding.  Physical Exam  Constitutional: She appears well-developed.  HENT:  Head: Normocephalic.  Respiratory: Effort normal.  GI: Soft.  Musculoskeletal:        General: Normal range of motion.     Cervical back: Normal range of motion.  Neurological: She is alert.  Skin: Skin is warm.    MAU Course  Procedures  MDM -Limited US shows patient is 16 weeks 5 days.  -give 400 mg dose of labetalol before discharge.  Patient continues to be asymptmatic.   Assessment and Plan  -Keep endocrinology appt tomorrow.  -Message sent to [facility] to start prenatal care -start 400 mg BID labetelol per Dr. Ilda Basset -Return precautions given  Mervyn Skeeters [facility] 10/16/2019, 8:21 PM

## 2019-12-15 ENCOUNTER — Encounter: Payer: Self-pay | Admitting: Family Medicine

## 2019-12-15 ENCOUNTER — Other Ambulatory Visit (HOSPITAL_COMMUNITY)
Admission: RE | Admit: 2019-12-15 | Discharge: 2019-12-15 | Disposition: A | Discharge status: Home / Self Care | Payer: Medicaid Other | Source: Ambulatory Visit | Attending: Family Medicine | Admitting: Family Medicine

## 2019-12-15 ENCOUNTER — Other Ambulatory Visit: Payer: Self-pay

## 2019-12-15 ENCOUNTER — Ambulatory Visit (INDEPENDENT_AMBULATORY_CARE_PROVIDER_SITE_OTHER): Payer: Medicaid Other | Admitting: Family Medicine

## 2019-12-15 VITALS — BP 155/98 | HR 97 | Wt 281.5 lb

## 2019-12-15 DIAGNOSIS — O0992 Supervision of high risk pregnancy, unspecified, second trimester: Secondary | ICD-10-CM

## 2019-12-15 DIAGNOSIS — O10919 Unspecified pre-existing hypertension complicating pregnancy, unspecified trimester: Secondary | ICD-10-CM

## 2019-12-15 DIAGNOSIS — Z98891 History of uterine scar from previous surgery: Secondary | ICD-10-CM

## 2019-12-15 DIAGNOSIS — Z3A25 25 weeks gestation of pregnancy: Secondary | ICD-10-CM

## 2019-12-15 DIAGNOSIS — E669 Obesity, unspecified: Secondary | ICD-10-CM

## 2019-12-15 DIAGNOSIS — E039 Hypothyroidism, unspecified: Secondary | ICD-10-CM | POA: Diagnosis not present

## 2019-12-15 DIAGNOSIS — O10912 Unspecified pre-existing hypertension complicating pregnancy, second trimester: Secondary | ICD-10-CM

## 2019-12-15 DIAGNOSIS — O99280 Endocrine, nutritional and metabolic diseases complicating pregnancy, unspecified trimester: Secondary | ICD-10-CM

## 2019-12-15 DIAGNOSIS — O99210 Obesity complicating pregnancy, unspecified trimester: Secondary | ICD-10-CM

## 2019-12-15 DIAGNOSIS — O0990 Supervision of high risk pregnancy, unspecified, unspecified trimester: Secondary | ICD-10-CM | POA: Diagnosis present

## 2019-12-15 DIAGNOSIS — O99282 Endocrine, nutritional and metabolic diseases complicating pregnancy, second trimester: Secondary | ICD-10-CM

## 2019-12-15 DIAGNOSIS — O99212 Obesity complicating pregnancy, second trimester: Secondary | ICD-10-CM

## 2019-12-15 LAB — POCT URINALYSIS DIP (DEVICE)
Bilirubin Urine: NEGATIVE
Glucose, UA: NEGATIVE mg/dL
Ketones, ur: NEGATIVE mg/dL
Nitrite: NEGATIVE
Protein, ur: NEGATIVE mg/dL
Specific Gravity, Urine: 1.025 (ref 1.005–1.030)
Urobilinogen, UA: 0.2 mg/dL (ref 0.0–1.0)
pH: 7 (ref 5.0–8.0)

## 2019-12-15 MED ORDER — LABETALOL HCL 200 MG PO TABS: 400.0000 mg | ORAL_TABLET | Freq: Two times a day (BID) | ORAL | 1 refills | Status: AC

## 2019-12-15 MED ORDER — ASPIRIN 81 MG PO CHEW: 81.0000 mg | CHEWABLE_TABLET | Freq: Every day | ORAL | 2 refills | Status: DC

## 2019-12-15 NOTE — Progress Notes (Signed)
Subjective:   [name] is a 34 y.o. [id number] at [redacted]w[redacted]d by midtrimester ultrasound being seen today for her first obstetrical visit.  Her obstetrical history is significant for obesity, pregnancy induced hypertension and prior C-section x 2, CHTN, hypothyroid. Pregnancy history fully reviewed.  Patient reports no complaints.  HISTORY: OB History  Gravida Para Term Preterm AB Living  4 2 2  0 1 2  SAB TAB Ectopic Multiple Live Births  1 0 0 0 2    # Outcome Date GA Lbr Len/2nd Weight Sex Delivery Anes PTL Lv  4 Current           3 Term 07/01/18 [redacted]w[redacted]d  5 lb 11.7 oz (2.6 kg) F CS-LTranv Spinal  LIV     Name: Stang,GIRL Navjot     Apgar1: 8  Apgar5: 9  2 SAB 2017             Birth Comments: had d & C  1 Term 10/02/04 [redacted]w[redacted]d   F CS-Unspec   LIV     Birth Comments: c/s - not sure why- but thinks it was due to either baby hr or her hr   Last pap smear was  2019 and was normal Past Medical History:  Diagnosis Date  . Anxiety   . Cancer ([place]) 02/2012   thyroid cancer  . Hypertension   . Preeclampsia in postpartum period 07/08/2018  . Severe Pre-eclampsia superimposed on chronic hypertension, delivered 07/01/2018  . Thyroid disease    cancer   Past Surgical History:  Procedure Laterality Date  . CESAREAN SECTION  2006  . CESAREAN SECTION N/A 07/01/2018   Procedure: CESAREAN SECTION;  Surgeon: Sloan Leiter, MD;  Location: [facility] [facility];  Service: Obstetrics;  Laterality: N/A;  . DILATION AND EVACUATION N/A 02/07/2015   Procedure: DILATATION AND EVACUATION;  Surgeon: Lavonia Drafts, MD;  Location: [facility];  Service: Gynecology;  Laterality: N/A;  . THYROIDECTOMY  02/2012   total L side first and R side 7 days later.   Family History  Problem Relation Age of Onset  . Hypertension Mother   . Blindness Mother        one eye   Social History   Tobacco Use  . Smoking status: Never Smoker  . Smokeless tobacco: Never Used  Substance Use Topics  .  Alcohol use: No  . Drug use: Yes    Types: Marijuana    Comment: last used 05/19/18   No Active Allergies Current Outpatient Medications on File Prior to Visit  Medication Sig Dispense Refill  . levothyroxine (SYNTHROID, LEVOTHROID) 125 MCG tablet Take 2 tablets (250 mcg total) by mouth daily at 6 (six) AM. (Patient taking differently: Take 200 mcg by mouth daily at 6 (six) AM. ) 60 tablet 2  . albuterol (VENTOLIN HFA) 108 (90 Base) MCG/ACT inhaler Inhale 1-2 puffs into the lungs every 6 (six) hours as needed for wheezing or shortness of breath. (Patient not taking: Reported on 12/15/2019) 8 g 0   No current facility-administered medications on file prior to visit.     Exam   Vitals:   12/15/19 0906  BP: (!) 155/98  Pulse: 97  Weight: 281 lb 8 oz (127.7 kg)   Fetal Heart Rate (bpm): 150 Fundal Height 25  System: General: well-developed, well-nourished obese female in no acute distress   Skin: normal coloration and turgor, no rashes   Neurologic: oriented, normal, negative, normal mood   Extremities: normal strength, tone, and  muscle mass, ROM of all joints is normal   HEENT Extraocular movement intact and sclera clear, anicteric   Mouth/Teeth mucous membranes moist, pharynx normal without lesions and dental hygiene good   Neck supple and no masses   Cardiovascular: regular rate and rhythm   Respiratory:  no respiratory distress, normal effort   Abdomen: soft, non-tender; bowel sounds normal; no masses,  no organomegaly     Assessment:   Pregnancy: [id number] Patient Active Problem List   Diagnosis Date Noted  . Supervision of high risk pregnancy, antepartum 12/15/2019  . Substance abuse affecting pregnancy in third trimester, antepartum 05/18/2018  . Obesity, Class III, BMI 40-49.9 (morbid obesity) ([place]) 12/30/2017  . Obesity affecting pregnancy, antepartum 12/30/2017  . Hypothyroidism during pregnancy, antepartum 12/30/2017  . Chronic hypertension in pregnancy 12/16/2017    . History of cesarean delivery 12/16/2017  . 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  . Hematuria 11/11/2017  . 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 Thyroid cancer ([place]) 09/11/2016     Plan:  1. Supervision of high risk pregnancy, antepartum New OB labs Genetics Detail u/s - GC/Chlamydia probe amp ([place])not at [facility] - Culture, OB Urine - [facility] AMB BABYSCRIPTS SCHEDULE OPTIMIZATION - Genetic Screening - Obstetric Panel, Including HIV - Hemoglobin A1c - Protein / creatinine ratio, urine - Comprehensive metabolic panel - [place] MFM OB DETAIL +14 WK; Future - Hepatitis C Antibody  2. Chronic hypertension in pregnancy Begin Labetalol and ASA--required 2 meds last pregnancy, on none right now. Had pp readmission after last pregnancy - labetalol (NORMODYNE) 200 MG tablet; Take 2 tablets (400 mg total) by mouth 2 (two) times daily.  Dispense: 120 tablet; Refill: 1 - aspirin 81 MG chewable tablet; Chew 1 tablet (81 mg total) by mouth daily.  Dispense: 90 tablet; Refill: 2  3. Hypothyroidism during pregnancy, antepartum On 200 mcg--recheck TSH  4. History of cesarean delivery X 2, will need repeat. Desires pp IUD at time of surgery.   Initial labs drawn. Continue prenatal vitamins. Genetic Screening discussed, NIPS: ordered. Ultrasound discussed; fetal anatomic survey: ordered. Problem list reviewed and updated. The nature of [facility] with multiple MDs and other Advanced Practice Providers was explained to patient; also emphasized that residents, students are part of our team. Routine obstetric precautions reviewed. Return in about 3 weeks (around 01/05/2020) for in person, 28 wk labs.

## 2019-12-15 NOTE — Patient Instructions (Addendum)
If you are in need of transportation to get to and from your appointments in our office.  You can reach Transportation Services by calling [phone] Monday - Friday  7am-6pm.  [place] [phone]) . [facility] Davy Pique, MD; Gwendlyn Deutscher, MD; Walker Kehr, MD; Andria Frames, MD; McDiarmid, MD; Dutch Quint, MD; Nori Riis, MD; Mingo Amber, [address] [place], [place] [address] o [phone] o Mon-Fri 8:30-12:30, 1:30-5:00 o Providers come to see babies at [facility] o Accepting Medicaid . [facility] providers who accept newborns: Dorthy Cooler, MD; Orland Mustard, MD; Stephanie Acre, MD o [place], [place], [place] [address] o [phone] o Mon-Fri 8:00-5:30 o Babies seen by providers at [facility] o Does NOT accept Medicaid o Please call early in hospitalization for appointment (limited availability)  . Mustard Harrison, MD o [address], [place], [place] [address] o [phone] o Mon, Tue, Thur, Fri 8:30-5:00, Wed 10:00-7:00 (closed 1-2pm) o Babies seen by [facility] providers o Accepting Medicaid . [place], MD o [place], [place], [place] [address] o [phone] o Mon-Fri 8:30-5:00, Sat 8:30-12:00 o Provider comes to see babies at [place] Medicaid o Must have been referred from current patients or contacted office prior to delivery . [place] [facility] ([place] [facility] [place]) Franne Forts, MD; Tamera Punt, MD; Doneen Poisson, MD; Fatima Sanger, MD; Wynetta Emery, MD; Jess Barters, MD; Tami Ribas, MD; Herbert Moors, MD; Derrell Lolling, MD; Dorothyann Peng, MD; Lucious Groves, NP; Baldo Ash, NP o [address], [place], [place] [address] o [phone] o Mon, Tue, Thur, Fri 8:30-5:30, Wed 9:30-5:30, Sat 8:30-12:30 o Babies seen by [facility] providers o Accepting  Medicaid o Only accepting infants of first-time parents or siblings of current patients [facility] discharge coordinator will make follow-up appointment . Baltazar Najjar o [place] [address], [place], [place]  [address] o [phone]   Fax - [phone] . [facility] o [id number] [address], [address], [place], [place]  [address] o Phone - [phone]   Fax [phone] . [place], [place], [place], [place]  [address] o [phone]  [place] [phone]) . [facility] Reginal Lutes, MD; Jacklynn Ganong, MD; Torrie Mayers, MD; MD; Rosana Hoes, MD; Servando Salina, MD; Rose Fillers, MD; Rex Kras, MD; Corinna Capra, MD; Volney American, MD; Trilby Drummer, MD; Janann Colonel, MD; Jimmye Norman, [place], [place], [place] [address] o [phone] o Mon-Fri 8:30-5:00 (extended evenings Mon-Thur as needed), Sat-Sun 10:00-1:00 o Providers come to see babies at [place] Medicaid for families of first-time babies and families with all children in the household age 96 and under. Must register with office prior to making appointment (M-F only). . [place], NP; Tomi Bamberger, MD; Redmond School, MD; [place], [address] [place], [place] [address] o [phone] o Mon-Fri 8:00-5:00 o Babies seen by providers at [facility] o Does NOT accept Medicaid/Commercial Insurance Only . [facility] [place] ([place] [facility])  Marnee Guarneri, MD; Drema Dallas, MD; Montine Circle, MD; Vilma Prader, MD; Vanita Panda, MD; Alfonso Ramus, MD; Ruthann Cancer, MD; Roxanne Mins, MD; Rosalva Ferron, MD; Polly Cobia, MD o [place]., [place],  [address] o [phone] o Mon-Fri 8:30-5:30, Sat (Oct.-Mar.) 9:00-1:00 o Babies seen by providers at [place] [phone]) . [facility], MD; Suzan Slick, [place]  [place], [place], [place] [address] o [phone] o Mon-Fri 8:30-5:00, Sat 8:30-12:00 o Providers come  to see babies at [facility] [facility] o Does NOT accept Medicaid . [place], [place]; [place], MD; [place], [place]; Nancy Fetter, MD; Moreen Fowler, [place], [place], [place] [address] o [phone] o Mon-Fri 8:00-5:00 o Babies seen by providers at [facility] o Does NOT accept Medicaid o Only accepting babies of parents who are patients o Please call early in hospitalization for appointment (limited availability) . [facility] Blanca Friend, MD; Sharlene Motts, MD; Rod Can, MD; Warner Mccreedy, NP; Sabra Heck, MD; Ermalinda Memos, MD; Sharlett Iles, NP; Aurther Loft, MD; Jerrye Beavers, MD; Marcello Moores, MD; Berline Lopes, MD; Charolette Forward, MD o [place]. [place], [place], [place] [address] o [phone] o Mon-Fri 8:00-5:00, Sat 9:00-12:00 o Providers come to see babies at [facility] o Does NOT accept [facility] [phone]) . [place] [facility] [place] providers accepting new patients: Dayna Ramus, NP; [place], [place], [place], [place] [address] o [phone] o Mon-Fri 8:00-5:00 o Babies seen by providers at [facility] o Does NOT accept Medicaid o Only accepting babies of parents who are patients o Please call early in hospitalization for appointment (limited availability) . [facility] Oswaldo Conroy, MD; Sheran Lawless, MD o [place] [place], [place] [address] o [phone] (press 1 to schedule appointment) o Mon-Fri 8:00-5:00 o Providers come to see babies at [facility] o Does NOT accept Medicaid . [facility] Jodi Mourning, MD o [address], [place], [place] [address] o [phone] o Mon-Fri 8:30-5:00 (lunch 12:30-1:00), extended hours by appointment only Wed 5:00-6:30 o Babies seen by [facility] providers o Accepting Medicaid . [place] at Evalyn Casco, MD; [place], MD; Ethlyn Gallery, MD o [place], [place], [place] [address] o [phone] o Mon-Fri 8:00-5:00 o Babies seen by [facility] providers o Does NOT accept Medicaid . Therapist, music at [place], MD; Yong Channel, MD; [place], [place] [address] [place], [place] [address] o [phone] o Mon-Fri 8:00-5:00 o Babies seen by [facility] providers o Does NOT accept Medicaid . [place], [place]; [place], [place]; [place], NP; Albertina Parr, MD; Frederic Jericho, MD; Ronney Lion, MD; Carlos Levering, NP; Jerelene Redden, NP; Tomasita Crumble, NP; Ronelle Nigh, NP; Corinna Lines, MD; [place], MD o [place]., [place], [place] [address] o [phone] o Mon-Fri 8:30-5:00, Sat 10:00-1:00 o Providers come to see babies at [facility] o Does NOT accept Medicaid o Free prenatal information session Tuesdays at 4:45pm . [facility] Porfirio Oar, MD; [place], [place]; [place], [place]; [place], [place]., [place] [address] o [phone] o Mon-Fri 7:30-5:30 o Babies seen by [facility] providers . [facility] o [address], [place], [place], [place]  [address] o [phone]   Fax - [phone]  [place] [phone] & [phone]) . [place], [place] o [address] [place], [place] [address] o [phone] o Mon-Thur 8:00-6:00 o Providers come to see babies at [place] Medicaid . [place], NP; Melford Aase, MD; [place], [place]; [place], [place]., [place], [place] [address] o [phone] o Mon-Thur 7:30-7:30, Fri 7:30-4:30 o Babies seen by [facility] providers o Accepting Medicaid . [facility] Nyra Jabs, MD; Cristino Martes, NP; Gertie Baron, MD o [place] [address], [place],  [address] o [phone] o Mon-Fri 8:30-5:00, Sat 8:30-12:00 o Providers come to  see babies at [place] Medicaid o Must have "Meet & Greet" appointment at office prior to delivery . [place] ([place]) Jodene Nam, MD;  Juleen China, MD; Clydene Laming, [place] [address], [place], [place] [address] o [phone] o Mon-Wed 8:00-6:00, Thur-Fri 8:00-5:00, Sat 9:00-12:00 o Providers come to see babies at [facility] o Does NOT accept Medicaid o Only accepting siblings of current patients . [facility] [place], [place], [place], [place]  [address] o [phone]   Fax [phone] . [place] at [place] [address] [place], [place]  [address] o [phone]   Fax - [place] [phone] & [phone]) . Therapist, music at [place], DO; [place], [place]., [place], [place] [address] o [phone] o Mon-Fri 7:00-5:00 o Babies seen by [facility] providers o Does NOT accept Medicaid . [employer], MD; [place], [place]; [place], [place], [place], [place] [address] o [phone] o Mon-Fri 8:00-5:00 o Babies seen by [facility] providers o Accepting Medicaid . [place], MD; [place], [place]; [place], NP; [place], [place], [place], [place] [address] o [phone] o Mon-Fri 8:00-5:00 o Babies seen by providers at [facility] o Accepting Medicaid  Breastfeeding  Choosing to breastfeed is one of the best decisions you can make for yourself and your baby. A change in hormones during pregnancy causes your breasts to make breast milk in your milk-producing glands. Hormones prevent breast milk from being released before your baby is born. They also prompt milk flow after birth. Once breastfeeding has begun, thoughts of your baby, as well as his or her sucking or crying, can stimulate the release of milk from your milk-producing glands. Benefits of breastfeeding Research shows that breastfeeding offers many health benefits for infants and mothers. It  also offers a cost-free and convenient way to feed your baby. For your baby  Your first milk (colostrum) helps your baby's digestive system to function better.  Special cells in your milk (antibodies) help your baby to fight off infections.  Breastfed babies are less likely to develop asthma, allergies, obesity, or type 2 diabetes. They are also at lower risk for sudden infant death syndrome (SIDS).  Nutrients in breast milk are better able to meet your baby's needs compared to infant formula.  Breast milk improves your baby's brain development. For you  Breastfeeding helps to create a very special bond between you and your baby.  Breastfeeding is convenient. Breast milk costs nothing and is always available at the correct temperature.  Breastfeeding helps to burn calories. It helps you to lose the weight that you gained during pregnancy.  Breastfeeding makes your uterus return faster to its size before pregnancy. It also slows bleeding (lochia) after you give birth.  Breastfeeding helps to lower your risk of developing type 2 diabetes, osteoporosis, rheumatoid arthritis, cardiovascular disease, and breast, ovarian, uterine, and endometrial cancer later in life. Breastfeeding basics Starting breastfeeding  Find a comfortable place to sit or lie down, with your neck and back well-supported.  Place a pillow or a rolled-up blanket under your baby to bring him or her to the level of your breast (if you are seated). Nursing pillows are specially designed to help support your arms and your baby while you breastfeed.  Make sure that your baby's tummy (abdomen) is facing your abdomen.  Gently massage your breast. With your fingertips, massage from the outer edges of your breast inward toward the nipple. This encourages milk flow. If your milk flows slowly, you may need to continue this action during the feeding.  Support your breast with 4 fingers underneath and your thumb above your nipple  (make the letter "C" with your hand). Make sure your fingers are well away from your nipple and your baby's mouth.  Stroke your baby's lips gently with your finger or nipple.  When your baby's mouth is open wide enough, quickly bring your baby to your breast, placing your entire nipple and as much of the areola as possible into your baby's mouth. The areola is the colored area around your nipple. ? More areola should be visible above your baby's upper lip than below the lower lip. ? Your baby's lips should be opened and extended outward (flanged) to ensure an adequate, comfortable latch. ? Your baby's tongue should be between his or her lower gum and your breast.  Make sure that your baby's mouth is correctly positioned around your nipple (latched). Your baby's lips should create a seal on your breast and be turned out (everted).  It is common for your baby to suck about 2-3 minutes in order to start the flow of breast milk. Latching Teaching your baby how to latch onto your breast properly is very important. An improper latch can cause nipple pain, decreased milk supply, and poor weight gain in your baby. Also, if your baby is not latched onto your nipple properly, he or she may swallow some air during feeding. This can make your baby fussy. Burping your baby when you switch breasts during the feeding can help to get rid of the air. However, teaching your baby to latch on properly is still the best way to prevent fussiness from swallowing air while breastfeeding. Signs that your baby has successfully latched onto your nipple  Silent tugging or silent sucking, without causing you pain. Infant's lips should be extended outward (flanged).  Swallowing heard between every 3-4 sucks once your milk has started to flow (after your let-down milk reflex occurs).  Muscle movement above and in front of his or her ears while sucking. Signs that your baby has not successfully latched onto your  nipple  Sucking sounds or smacking sounds from your baby while breastfeeding.  Nipple pain. If you think your baby has not latched on correctly, slip your finger into the corner of your baby's mouth to break the suction and place it between your baby's gums. Attempt to start breastfeeding again. Signs of successful breastfeeding Signs from your baby  Your baby will gradually decrease the number of sucks or will completely stop sucking.  Your baby will fall asleep.  Your baby's body will relax.  Your baby will retain a small amount of milk in his or her mouth.  Your baby will let go of your breast by himself or herself. Signs from you  Breasts that have increased in firmness, weight, and size 1-3 hours after feeding.  Breasts that are softer immediately after breastfeeding.  Increased milk volume, as well as a change in milk consistency and color by the fifth day of breastfeeding.  Nipples that are not sore, cracked, or bleeding. Signs that your baby is getting enough milk  Wetting at least 1-2 diapers during the first 24 hours after birth.  Wetting at least 5-6 diapers every  24 hours for the first week after birth. The urine should be clear or pale yellow by the age of 5 days.  Wetting 6-8 diapers every 24 hours as your baby continues to grow and develop.  At least 3 stools in a 24-hour period by the age of 5 days. The stool should be soft and yellow.  At least 3 stools in a 24-hour period by the age of 7 days. The stool should be seedy and yellow.  No loss of weight greater than 10% of birth weight during the first 3 days of life.  Average weight gain of 4-7 oz (113-198 g) per week after the age of 4 days.  Consistent daily weight gain by the age of 5 days, without weight loss after the age of 2 weeks. After a feeding, your baby may spit up a small amount of milk. This is normal. Breastfeeding frequency and duration Frequent feeding will help you make more milk and can  prevent sore nipples and extremely full breasts (breast engorgement). Breastfeed when you feel the need to reduce the fullness of your breasts or when your baby shows signs of hunger. This is called "breastfeeding on demand." Signs that your baby is hungry include:  Increased alertness, activity, or restlessness.  Movement of the head from side to side.  Opening of the mouth when the corner of the mouth or cheek is stroked (rooting).  Increased sucking sounds, smacking lips, cooing, sighing, or squeaking.  Hand-to-mouth movements and sucking on fingers or hands.  Fussing or crying. Avoid introducing a pacifier to your baby in the first 4-6 weeks after your baby is born. After this time, you may choose to use a pacifier. Research has shown that pacifier use during the first year of a baby's life decreases the risk of sudden infant death syndrome (SIDS). Allow your baby to feed on each breast as long as he or she wants. When your baby unlatches or falls asleep while feeding from the first breast, offer the second breast. Because newborns are often sleepy in the first few weeks of life, you may need to awaken your baby to get him or her to feed. Breastfeeding times will vary from baby to baby. However, the following rules can serve as a guide to help you make sure that your baby is properly fed:  Newborns (babies 36 weeks of age or younger) may breastfeed every 1-3 hours.  Newborns should not go without breastfeeding for longer than 3 hours during the day or 5 hours during the night.  You should breastfeed your baby a minimum of 8 times in a 24-hour period. Breast milk pumping     Pumping and storing breast milk allows you to make sure that your baby is exclusively fed your breast milk, even at times when you are unable to breastfeed. This is especially important if you go back to work while you are still breastfeeding, or if you are not able to be present during feedings. Your lactation  consultant can help you find a method of pumping that works best for you and give you guidelines about how long it is safe to store breast milk. Caring for your breasts while you breastfeed Nipples can become dry, cracked, and sore while breastfeeding. The following recommendations can help keep your breasts moisturized and healthy:  Avoid using soap on your nipples.  Wear a supportive bra designed especially for nursing. Avoid wearing underwire-style bras or extremely tight bras (sports bras).  Air-dry your nipples for  3-4 minutes after each feeding.  Use only cotton bra pads to absorb leaked breast milk. Leaking of breast milk between feedings is normal.  Use lanolin on your nipples after breastfeeding. Lanolin helps to maintain your skin's normal moisture barrier. Pure lanolin is not harmful (not toxic) to your baby. You may also hand express a few drops of breast milk and gently massage that milk into your nipples and allow the milk to air-dry. In the first few weeks after giving birth, some women experience breast engorgement. Engorgement can make your breasts feel heavy, warm, and tender to the touch. Engorgement peaks within 3-5 days after you give birth. The following recommendations can help to ease engorgement:  Completely empty your breasts while breastfeeding or pumping. You may want to start by applying warm, moist heat (in the shower or with warm, water-soaked hand towels) just before feeding or pumping. This increases circulation and helps the milk flow. If your baby does not completely empty your breasts while breastfeeding, pump any extra milk after he or she is finished.  Apply ice packs to your breasts immediately after breastfeeding or pumping, unless this is too uncomfortable for you. To do this: ? Put ice in a plastic bag. ? Place a towel between your skin and the bag. ? Leave the ice on for 20 minutes, 2-3 times a day.  Make sure that your baby is latched on and  positioned properly while breastfeeding. If engorgement persists after 48 hours of following these recommendations, contact your health care provider or a Science writer. Overall health care recommendations while breastfeeding  Eat 3 healthy meals and 3 snacks every day. Well-nourished mothers who are breastfeeding need an additional 450-500 calories a day. You can meet this requirement by increasing the amount of a balanced diet that you eat.  Drink enough water to keep your urine pale yellow or clear.  Rest often, relax, and continue to take your prenatal vitamins to prevent fatigue, stress, and low vitamin and mineral levels in your body (nutrient deficiencies).  Do not use any products that contain nicotine or tobacco, such as cigarettes and e-cigarettes. Your baby may be harmed by chemicals from cigarettes that pass into breast milk and exposure to secondhand smoke. If you need help quitting, ask your health care provider.  Avoid alcohol.  Do not use illegal drugs or marijuana.  Talk with your health care provider before taking any medicines. These include over-the-counter and prescription medicines as well as vitamins and herbal supplements. Some medicines that may be harmful to your baby can pass through breast milk.  It is possible to become pregnant while breastfeeding. If birth control is desired, ask your health care provider about options that will be safe while breastfeeding your baby. Where to find more information: [employer]: www.llli.org Contact a health care provider if:  You feel like you want to stop breastfeeding or have become frustrated with breastfeeding.  Your nipples are cracked or bleeding.  Your breasts are red, tender, or warm.  You have: ? Painful breasts or nipples. ? A swollen area on either breast. ? A fever or chills. ? Nausea or vomiting. ? Drainage other than breast milk from your nipples.  Your breasts do not become  full before feedings by the fifth day after you give birth.  You feel sad and depressed.  Your baby is: ? Too sleepy to eat well. ? Having trouble sleeping. ? More than 79 week old and wetting fewer than 6 diapers  in a 24-hour period. ? Not gaining weight by 22 days of age.  Your baby has fewer than 3 stools in a 24-hour period.  Your baby's skin or the white parts of his or her eyes become yellow. Get help right away if:  Your baby is overly tired (lethargic) and does not want to wake up and feed.  Your baby develops an unexplained fever. Summary  Breastfeeding offers many health benefits for infant and mothers.  Try to breastfeed your infant when he or she shows early signs of hunger.  Gently tickle or stroke your baby's lips with your finger or nipple to allow the baby to open his or her mouth. Bring the baby to your breast. Make sure that much of the areola is in your baby's mouth. Offer one side and burp the baby before you offer the other side.  Talk with your health care provider or lactation consultant if you have questions or you face problems as you breastfeed. This information is not intended to replace advice given to you by your health care provider. Make sure you discuss any questions you have with your health care provider. Document Revised: 10/15/2017 Document Reviewed: 08/22/2016 Elsevier Patient Education  [place].

## 2019-12-16 LAB — COMPREHENSIVE METABOLIC PANEL
ALT: 6 IU/L (ref 0–32)
AST: 7 IU/L (ref 0–40)
Albumin/Globulin Ratio: 1.7 (ref 1.2–2.2)
Albumin: 3.9 g/dL (ref 3.8–4.8)
Alkaline Phosphatase: 72 IU/L (ref 39–117)
BUN/Creatinine Ratio: 11 (ref 9–23)
BUN: 6 mg/dL (ref 6–20)
Bilirubin Total: 0.3 mg/dL (ref 0.0–1.2)
CO2: 17 mmol/L — ABNORMAL LOW (ref 20–29)
Calcium: 8.8 mg/dL (ref 8.7–10.2)
Chloride: 106 mmol/L (ref 96–106)
Creatinine, Ser: 0.53 mg/dL — ABNORMAL LOW (ref 0.57–1.00)
GFR calc Af Amer: 143 mL/min/{1.73_m2} (ref >59)
GFR calc non Af Amer: 124 mL/min/{1.73_m2} (ref >59)
Globulin, Total: 2.3 g/dL (ref 1.5–4.5)
Glucose: 93 mg/dL (ref 65–99)
Potassium: 4.2 mmol/L (ref 3.5–5.2)
Sodium: 138 mmol/L (ref 134–144)
Total Protein: 6.2 g/dL (ref 6.0–8.5)

## 2019-12-16 LAB — PROTEIN / CREATININE RATIO, URINE
Creatinine, Urine: 131.1 mg/dL
Protein, Ur: 16.1 mg/dL
Protein/Creat Ratio: 123 mg/g creat (ref 0–200)

## 2019-12-16 LAB — OBSTETRIC PANEL, INCLUDING HIV
Antibody Screen: NEGATIVE
Basophils Absolute: 0 10*3/uL (ref 0.0–0.2)
Basos: 0 %
EOS (ABSOLUTE): 0.1 10*3/uL (ref 0.0–0.4)
Eos: 1 %
HIV Screen 4th Generation wRfx: NONREACTIVE
Hematocrit: 31.6 % — ABNORMAL LOW (ref 34.0–46.6)
Hemoglobin: 11.1 g/dL (ref 11.1–15.9)
Hepatitis B Surface Ag: NEGATIVE
Immature Grans (Abs): 0.1 10*3/uL (ref 0.0–0.1)
Immature Granulocytes: 1 %
Lymphocytes Absolute: 1.7 10*3/uL (ref 0.7–3.1)
Lymphs: 17 %
MCH: 31 pg (ref 26.6–33.0)
MCHC: 35.1 g/dL (ref 31.5–35.7)
MCV: 88 fL (ref 79–97)
Monocytes Absolute: 0.6 10*3/uL (ref 0.1–0.9)
Monocytes: 6 %
Neutrophils Absolute: 7.4 10*3/uL — ABNORMAL HIGH (ref 1.4–7.0)
Neutrophils: 75 %
Platelets: 232 10*3/uL (ref 150–450)
RBC: 3.58 x10E6/uL — ABNORMAL LOW (ref 3.77–5.28)
RDW: 12.2 % (ref 11.7–15.4)
RPR Ser Ql: NONREACTIVE
Rh Factor: POSITIVE
Rubella Antibodies, IGG: 4.45 index (ref >0.99)
WBC: 9.9 10*3/uL (ref 3.4–10.8)

## 2019-12-16 LAB — GC/CHLAMYDIA PROBE AMP (~~LOC~~) NOT AT ARMC
Chlamydia: NEGATIVE
Comment: NEGATIVE
Comment: NORMAL
Neisseria Gonorrhea: NEGATIVE

## 2019-12-16 LAB — HEPATITIS C ANTIBODY: Hep C Virus Ab: <0.1 s/co ratio (ref 0.0–0.9)

## 2019-12-16 LAB — HEMOGLOBIN A1C
Est. average glucose Bld gHb Est-mCnc: 105 mg/dL
Hgb A1c MFr Bld: 5.3 % (ref 4.8–5.6)

## 2019-12-16 LAB — TSH: TSH: 17.3 u[IU]/mL — ABNORMAL HIGH (ref 0.450–4.500)

## 2019-12-17 LAB — URINE CULTURE, OB REFLEX

## 2019-12-17 LAB — CULTURE, OB URINE

## 2019-12-19 ENCOUNTER — Telehealth: Payer: Self-pay | Admitting: Family Medicine

## 2019-12-19 NOTE — Telephone Encounter (Signed)
Received a call from the patient stating she was suppose to be seeing Dr. Laury Axon. She wants to know if someone can call her back about getting a Rx filled.

## 2019-12-23 ENCOUNTER — Encounter: Payer: Self-pay | Admitting: *Deleted

## 2019-12-28 ENCOUNTER — Encounter (HOSPITAL_COMMUNITY): Payer: Self-pay | Admitting: Obstetrics & Gynecology

## 2019-12-28 ENCOUNTER — Inpatient Hospital Stay (HOSPITAL_COMMUNITY)
Admission: AD | Admit: 2019-12-28 | Discharge: 2019-12-29 | Disposition: A | Discharge status: Home / Self Care | Payer: Medicaid Other | Attending: Obstetrics & Gynecology | Admitting: Obstetrics & Gynecology

## 2019-12-28 ENCOUNTER — Telehealth: Payer: Self-pay

## 2019-12-28 DIAGNOSIS — Z3A27 27 weeks gestation of pregnancy: Secondary | ICD-10-CM | POA: Diagnosis not present

## 2019-12-28 DIAGNOSIS — O10012 Pre-existing essential hypertension complicating pregnancy, second trimester: Secondary | ICD-10-CM | POA: Insufficient documentation

## 2019-12-28 DIAGNOSIS — D649 Anemia, unspecified: Secondary | ICD-10-CM | POA: Diagnosis not present

## 2019-12-28 DIAGNOSIS — O10912 Unspecified pre-existing hypertension complicating pregnancy, second trimester: Secondary | ICD-10-CM

## 2019-12-28 DIAGNOSIS — O99012 Anemia complicating pregnancy, second trimester: Secondary | ICD-10-CM | POA: Diagnosis not present

## 2019-12-28 DIAGNOSIS — R600 Localized edema: Secondary | ICD-10-CM | POA: Diagnosis present

## 2019-12-28 DIAGNOSIS — Z8759 Personal history of other complications of pregnancy, childbirth and the puerperium: Secondary | ICD-10-CM

## 2019-12-28 LAB — URINALYSIS, ROUTINE W REFLEX MICROSCOPIC
Bilirubin Urine: NEGATIVE
Glucose, UA: NEGATIVE mg/dL
Ketones, ur: 5 mg/dL — AB
Nitrite: NEGATIVE
Protein, ur: 100 mg/dL — AB
Specific Gravity, Urine: 1.033 — ABNORMAL HIGH (ref 1.005–1.030)
WBC, UA: >50 WBC/hpf — ABNORMAL HIGH (ref 0–5)
pH: 5 (ref 5.0–8.0)

## 2019-12-28 NOTE — Telephone Encounter (Signed)
Called pt to go over Horizon results & advise to get Genetic Counseling, no answer, immediately gave the message stating VM box not set up yet, please call again later.

## 2019-12-28 NOTE — MAU Provider Note (Signed)
Chief Complaint  Patient presents with  . Hypertension     First Provider Initiated Contact with Patient 12/28/19 2303      S: [name]  is a 34 y.o. y.o. year old [id number] female at [redacted]w[redacted]d weeks gestation who presents to MAU with elevated blood pressures and lower extremity edema.  Has history of chronic hypertension and preeclampsia in a previous pregnancy.  Is currently supposed to be taking labetalol 400 mg twice daily but has only been taking 200 mg twice daily due to a misunderstanding.  Reports increasing lower extremity swelling over the last several days and was worried that her blood pressure was elevated.  Denies headache, visual disturbance, or epigastric pain.  Good fetal movement.  O:  Patient Vitals for the past 24 hrs:  BP Temp Pulse Resp Height Weight  12/29/19 0002 122/74 -- 81 -- -- --  12/28/19 2347 122/73 -- 83 -- -- --  12/28/19 2332 140/83 -- 88 -- -- --  12/28/19 2317 (!) 143/82 -- 90 -- -- --  12/28/19 2302 127/75 -- 89 -- -- --  12/28/19 2250 131/85 -- 88 -- -- --  12/28/19 2220 (!) 148/87 98.3 F (36.8 C) 93 18 5\' 1"  (1.549 m) 127.5 kg   General: NAD Heart: Regular rate Lungs: Normal rate and effort Abd: Soft, NT, Gravid, S=D Extremities: 2 + pitting Pedal edema Neuro: 2+ deep tendon reflexes, No clonus  NST:  Baseline: 145 bpm, Variability: Good {> 6 bpm), Accelerations: Non-reactive but appropriate for gestational age and Decelerations: Absent  Results for orders placed or performed during the hospital encounter of 12/28/19 (from the past 24 hour(s))  Urinalysis, Routine w reflex microscopic     Status: Abnormal   Collection Time: 12/28/19 10:27 PM  Result Value Ref Range   Color, Urine AMBER (A) YELLOW   APPearance CLOUDY (A) CLEAR   Specific Gravity, Urine 1.033 (H) 1.005 - 1.030   pH 5.0 5.0 - 8.0   Glucose, UA NEGATIVE NEGATIVE mg/dL   Hgb urine dipstick SMALL (A) NEGATIVE   Bilirubin Urine NEGATIVE NEGATIVE   Ketones, ur 5 (A)  NEGATIVE mg/dL   Protein, ur 100 (A) NEGATIVE mg/dL   Nitrite NEGATIVE NEGATIVE   Leukocytes,Ua MODERATE (A) NEGATIVE   RBC / HPF 11-20 0 - 5 RBC/hpf   WBC, UA >50 (H) 0 - 5 WBC/hpf   Bacteria, UA FEW (A) NONE SEEN   Squamous Epithelial / LPF 11-20 0 - 5   Mucus PRESENT    Ca Oxalate Crys, UA PRESENT   Protein / creatinine ratio, urine     Status: None   Collection Time: 12/28/19 11:04 PM  Result Value Ref Range   Creatinine, Urine 393.09 mg/dL   Total Protein, Urine 30 mg/dL   Protein Creatinine Ratio 0.08 0.00 - 0.15 mg/mg[Cre]  CBC     Status: Abnormal   Collection Time: 12/28/19 11:19 PM  Result Value Ref Range   WBC 8.3 4.0 - 10.5 K/uL   RBC 3.23 (L) 3.87 - 5.11 MIL/uL   Hemoglobin 9.9 (L) 12.0 - 15.0 g/dL   HCT 29.3 (L) 36.0 - 46.0 %   MCV 90.7 80.0 - 100.0 fL   MCH 30.7 26.0 - 34.0 pg   MCHC 33.8 30.0 - 36.0 g/dL   RDW 12.1 11.5 - 15.5 %   Platelets 210 150 - 400 K/uL   nRBC 0.0 0.0 - 0.2 %  Comprehensive metabolic panel     Status: Abnormal   Collection  Time: 12/28/19 11:19 PM  Result Value Ref Range   Sodium 137 135 - 145 mmol/L   Potassium 3.5 3.5 - 5.1 mmol/L   Chloride 110 98 - 111 mmol/L   CO2 22 22 - 32 mmol/L   Glucose, Bld 121 (H) 70 - 99 mg/dL   BUN 8 6 - 20 mg/dL   Creatinine, Ser 0.67 0.44 - 1.00 mg/dL   Calcium 8.4 (L) 8.9 - 10.3 mg/dL   Total Protein 6.0 (L) 6.5 - 8.1 g/dL   Albumin 2.9 (L) 3.5 - 5.0 g/dL   AST 10 (L) 15 - 41 U/L   ALT 9 0 - 44 U/L   Alkaline Phosphatase 57 38 - 126 U/L   Total Bilirubin 0.6 0.3 - 1.2 mg/dL   GFR calc non Af Amer >60 >60 mL/min   GFR calc Af Amer >60 >60 mL/min   Anion gap 5 5 - 15    MDM Some elevated blood pressures in MAU.  None severe range and no severe features.  Discussed proper dosing of her blood pressure medications.  Noted to be anemic, discussed taking iron supplements at home.  Fetal tracing appropriate for gestation  A:  1. Chronic hypertension complicating or reason for care during  pregnancy, second trimester   2. [redacted] weeks gestation of pregnancy     P:  Discharge home Rx ferrous sulfate Preeclampsia precautions  Jorje Guild, NP 12/29/2019 12:28 AM

## 2019-12-28 NOTE — MAU Note (Signed)
My feet were swollen yesterday and worse today. Felt like I was stepping on pins and needles. I was afraid my b/p may be up. Has older bottle of Labetalol of 200mg  tabs she brought with her. States she is taking one in the morning and one at night. Other concern today are that her feet are swollen was unsure if b/p was up.

## 2019-12-29 LAB — COMPREHENSIVE METABOLIC PANEL
ALT: 9 U/L (ref 0–44)
AST: 10 U/L — ABNORMAL LOW (ref 15–41)
Albumin: 2.9 g/dL — ABNORMAL LOW (ref 3.5–5.0)
Alkaline Phosphatase: 57 U/L (ref 38–126)
Anion gap: 5 (ref 5–15)
BUN: 8 mg/dL (ref 6–20)
CO2: 22 mmol/L (ref 22–32)
Calcium: 8.4 mg/dL — ABNORMAL LOW (ref 8.9–10.3)
Chloride: 110 mmol/L (ref 98–111)
Creatinine, Ser: 0.67 mg/dL (ref 0.44–1.00)
GFR calc Af Amer: >60 mL/min (ref >60)
GFR calc non Af Amer: >60 mL/min (ref >60)
Glucose, Bld: 121 mg/dL — ABNORMAL HIGH (ref 70–99)
Potassium: 3.5 mmol/L (ref 3.5–5.1)
Sodium: 137 mmol/L (ref 135–145)
Total Bilirubin: 0.6 mg/dL (ref 0.3–1.2)
Total Protein: 6 g/dL — ABNORMAL LOW (ref 6.5–8.1)

## 2019-12-29 LAB — CBC
HCT: 29.3 % — ABNORMAL LOW (ref 36.0–46.0)
Hemoglobin: 9.9 g/dL — ABNORMAL LOW (ref 12.0–15.0)
MCH: 30.7 pg (ref 26.0–34.0)
MCHC: 33.8 g/dL (ref 30.0–36.0)
MCV: 90.7 fL (ref 80.0–100.0)
Platelets: 210 10*3/uL (ref 150–400)
RBC: 3.23 MIL/uL — ABNORMAL LOW (ref 3.87–5.11)
RDW: 12.1 % (ref 11.5–15.5)
WBC: 8.3 10*3/uL (ref 4.0–10.5)
nRBC: 0 % (ref 0.0–0.2)

## 2019-12-29 LAB — PROTEIN / CREATININE RATIO, URINE
Creatinine, Urine: 393.09 mg/dL
Protein Creatinine Ratio: 0.08 mg/mg{Cre} (ref 0.00–0.15)
Total Protein, Urine: 30 mg/dL

## 2019-12-29 MED ORDER — FERROUS SULFATE 325 (65 FE) MG PO TABS: 325.0000 mg | ORAL_TABLET | Freq: Two times a day (BID) | ORAL | 1 refills | Status: AC

## 2019-12-29 NOTE — Discharge Instructions (Signed)

## 2019-12-29 NOTE — Progress Notes (Signed)
Jorje Guild NP in to see pt earlier and discuss lab results and d/c plan. Written and verbal d/c instructions given and understanding voiced. Pt understands to take Labetalol 400mg  in am and 400mg  in the evening.

## 2019-12-30 ENCOUNTER — Telehealth: Payer: Self-pay | Admitting: *Deleted

## 2019-12-30 ENCOUNTER — Encounter: Payer: Self-pay | Admitting: *Deleted

## 2019-12-30 LAB — CULTURE, OB URINE

## 2019-12-30 NOTE — Telephone Encounter (Signed)
-----   Message from Donnamae Jude, MD sent at 12/29/2019  8:14 AM EDT ----- Book with [facility] counseling and arrange FOB for testing.

## 2019-12-30 NOTE — Telephone Encounter (Signed)
[name] called back and states her Mother told her we were trying to reach her. I explained we were trying to reach her re: her horizon results showing increased risk for SMA and recommendation to have genetic counseling and partner testing. She states she had already seen the results when they were posted. She states her partner is not going to be tested. I explained Johnsie Cancel can send kit to him wherever he is. She states he is not going to be tested. I explained I still recommended she call [facility] to schedule genetic counseling to explain results in more detail. She voices understanding. Sehar Sedano,RN

## 2019-12-30 NOTE — Telephone Encounter (Signed)
I have also placed a note on Janelis's next ob appointment to follow up with her to make sure she got the MyChart message re: horizon results and recommendation. Danie Diehl,RN

## 2019-12-30 NOTE — Telephone Encounter (Signed)
I called Pollyann at her home/ mobile number and heard a message stating voicemail not set up- I was unable to leave a message.  I called her contact Marcie Bal and explained I was trying to reach Luna and Marcie Bal is listed as a contact. I asked if she had another number or could give her a message. She said she could give her a message. I asked her to tell Heiley we are trying to reach her and I will send a detailed MyChart message and she can respond to that or call us back.( of note needs to be offered genetic screening and partner testing - partner information not on [place]. ) Jacques Navy

## 2020-01-03 ENCOUNTER — Encounter: Payer: Self-pay | Admitting: *Deleted

## 2020-01-05 ENCOUNTER — Other Ambulatory Visit: Payer: Self-pay

## 2020-01-05 ENCOUNTER — Other Ambulatory Visit: Payer: Medicaid Other

## 2020-01-05 ENCOUNTER — Other Ambulatory Visit: Payer: Self-pay | Admitting: *Deleted

## 2020-01-05 ENCOUNTER — Encounter: Payer: Self-pay | Admitting: Obstetrics and Gynecology

## 2020-01-05 ENCOUNTER — Ambulatory Visit (INDEPENDENT_AMBULATORY_CARE_PROVIDER_SITE_OTHER): Payer: Medicaid Other | Admitting: Obstetrics and Gynecology

## 2020-01-05 VITALS — BP 102/69 | HR 85 | Wt 279.6 lb

## 2020-01-05 DIAGNOSIS — O0990 Supervision of high risk pregnancy, unspecified, unspecified trimester: Secondary | ICD-10-CM

## 2020-01-05 DIAGNOSIS — O99213 Obesity complicating pregnancy, third trimester: Secondary | ICD-10-CM

## 2020-01-05 DIAGNOSIS — F1291 Cannabis use, unspecified, in remission: Secondary | ICD-10-CM | POA: Insufficient documentation

## 2020-01-05 DIAGNOSIS — E890 Postprocedural hypothyroidism: Secondary | ICD-10-CM

## 2020-01-05 DIAGNOSIS — Z98891 History of uterine scar from previous surgery: Secondary | ICD-10-CM

## 2020-01-05 DIAGNOSIS — O10913 Unspecified pre-existing hypertension complicating pregnancy, third trimester: Secondary | ICD-10-CM

## 2020-01-05 DIAGNOSIS — O99283 Endocrine, nutritional and metabolic diseases complicating pregnancy, third trimester: Secondary | ICD-10-CM

## 2020-01-05 DIAGNOSIS — O10919 Unspecified pre-existing hypertension complicating pregnancy, unspecified trimester: Secondary | ICD-10-CM

## 2020-01-05 DIAGNOSIS — Z3A28 28 weeks gestation of pregnancy: Secondary | ICD-10-CM

## 2020-01-05 DIAGNOSIS — E039 Hypothyroidism, unspecified: Secondary | ICD-10-CM

## 2020-01-05 DIAGNOSIS — O0993 Supervision of high risk pregnancy, unspecified, third trimester: Secondary | ICD-10-CM

## 2020-01-05 DIAGNOSIS — R898 Other abnormal findings in specimens from other organs, systems and tissues: Secondary | ICD-10-CM | POA: Insufficient documentation

## 2020-01-05 DIAGNOSIS — Z148 Genetic carrier of other disease: Secondary | ICD-10-CM

## 2020-01-05 DIAGNOSIS — O99210 Obesity complicating pregnancy, unspecified trimester: Secondary | ICD-10-CM

## 2020-01-05 DIAGNOSIS — O99280 Endocrine, nutritional and metabolic diseases complicating pregnancy, unspecified trimester: Secondary | ICD-10-CM

## 2020-01-05 DIAGNOSIS — E669 Obesity, unspecified: Secondary | ICD-10-CM

## 2020-01-05 MED ORDER — FAMOTIDINE 20 MG PO TABS: 20.0000 mg | ORAL_TABLET | Freq: Every day | ORAL | 1 refills | Status: AC

## 2020-01-05 MED ORDER — ONDANSETRON 4 MG PO TBDP
4.0000 mg | ORAL_TABLET | Freq: Three times a day (TID) | ORAL | 0 refills | Status: DC | PRN
Start: 2020-01-05 — End: 2022-10-31

## 2020-01-05 NOTE — Progress Notes (Addendum)
   PRENATAL VISIT NOTE  Subjective:  [name] is a 34 y.o. [id number] at [redacted]w[redacted]d being seen today for ongoing prenatal care.  She is currently monitored for the following issues for this high-risk pregnancy and has Postoperative hypothyroidism; Thyroid cancer ([place]); Hematuria; Chronic hypertension in pregnancy; History of cesarean delivery; Status post complete thyroidectomy; Obesity, Class III, BMI 40-49.9 (morbid obesity) ([place]); Obesity affecting pregnancy, antepartum; Hypothyroidism during pregnancy, antepartum; Supervision of high risk pregnancy, antepartum; History of severe pre-eclampsia; Abnormal genetic test; and History of marijuana use on their problem list.  Patient reports nausea. Heartburn as well. Still with nausea.  Contractions: Not present. Vag. Bleeding: None.  Movement: Present. Denies leaking of fluid.   The following portions of the patient's history were reviewed and updated as appropriate: allergies, current medications, past family history, past medical history, past social history, past surgical history and problem list.   Objective:   Vitals:   01/05/20 0847 01/05/20 0904 01/05/20 0910 01/05/20 0935  BP: 97/64 96/62 (!) 96/53 102/69  Pulse: 69   85  Weight: 279 lb 9.6 oz (126.8 kg)       Fetal Status: Fetal Heart Rate (bpm): 150   Movement: Present     General:  Alert, oriented and cooperative. Patient is in no acute distress.  Skin: Skin is warm and dry. No rash noted.   Cardiovascular: Normal heart rate noted  Respiratory: Normal respiratory effort, no problems with respiration noted  Abdomen: Soft, gravid, appropriate for gestational age.  Pain/Pressure: Present     Pelvic: Cervical exam deferred        Extremities: Normal range of motion.  Edema: Mild pitting, slight indentation  Mental Status: Normal mood and affect. Normal behavior. Normal judgment and thought content.   Assessment and Plan:  Pregnancy: [id number] at [redacted]w[redacted]d  1. Chronic hypertension  in pregnancy Cont labetalol 400 mg BID  2. Supervision of high risk pregnancy, antepartum  3. Hypothyroidism during pregnancy, antepartum - Last TSH 17, per endo notes she is on 400 mcg daily (pt affirms this) - Pt reports she had been forgetting doses but has been doing better the last month - recheck TSH today - f/u with [facility]  4. Obesity affecting pregnancy, antepartum Cont baby aspirin  5. Abnormal genetic test SMA carrier Genetic counseling ordered today  6. History of cesarean delivery RCS requested today  7. Status post complete thyroidectomy  Of note patient felt very faint after taking glucola, she improved with lying down and cold compress.    Preterm labor symptoms and general obstetric precautions including but not limited to vaginal bleeding, contractions, leaking of fluid and fetal movement were reviewed in detail with the patient. Please refer to After Visit Summary for other counseling recommendations.   Return in about 2 weeks (around 01/19/2020) for high OB, in person.  Future Appointments  Date Time Provider [place]  01/12/2020 10:20 AM Truett Mainland, DO [facility]    Sloan Leiter, MD

## 2020-01-05 NOTE — Progress Notes (Signed)
Would like refill of zofran and synthroid today. She declines tdap. She felt dizzy after glucola and bp low, allowed to lay down on left side.  Kaydan Wong,RN [id number] States feels better, sitting up. BP 102/69. Lafawn Lenoir,RN

## 2020-01-06 ENCOUNTER — Telehealth: Payer: Self-pay | Admitting: *Deleted

## 2020-01-06 LAB — HIV ANTIBODY (ROUTINE TESTING W REFLEX): HIV Screen 4th Generation wRfx: NONREACTIVE

## 2020-01-06 LAB — RPR: RPR Ser Ql: NONREACTIVE

## 2020-01-06 LAB — GLUCOSE TOLERANCE, 2 HOURS W/ 1HR
Glucose, 1 hour: 110 mg/dL (ref 65–179)
Glucose, 2 hour: 160 mg/dL — ABNORMAL HIGH (ref 65–152)
Glucose, Fasting: 108 mg/dL — ABNORMAL HIGH (ref 65–91)

## 2020-01-06 LAB — CBC
Hematocrit: 30.3 % — ABNORMAL LOW (ref 34.0–46.6)
Hemoglobin: 10.7 g/dL — ABNORMAL LOW (ref 11.1–15.9)
MCH: 31.3 pg (ref 26.6–33.0)
MCHC: 35.3 g/dL (ref 31.5–35.7)
MCV: 89 fL (ref 79–97)
Platelets: 200 10*3/uL (ref 150–450)
RBC: 3.42 x10E6/uL — ABNORMAL LOW (ref 3.77–5.28)
RDW: 11.6 % — ABNORMAL LOW (ref 11.7–15.4)
WBC: 9.5 10*3/uL (ref 3.4–10.8)

## 2020-01-06 LAB — TSH: TSH: 0.967 u[IU]/mL (ref 0.450–4.500)

## 2020-01-06 NOTE — Telephone Encounter (Signed)
-----   Message from Sloan Leiter, MD sent at 01/05/2020  1:43 PM EDT ----- Pt saw substance abuse in pregnancy on her problem list this am and was very upset and tearful (found me in lobby as I was walking out), I told her I'd call her and fix it. I called her just now, no answer.  (seemed to be THC in last pregnancy and was never resolved). I sent a mychart message but also said I'd have somebody follow up with her.    Thanks,   kmd

## 2020-01-06 NOTE — Telephone Encounter (Signed)
I called [name] as requested and she stated she has already read the note in the chart . I reviewed that Dr.Davis had tried to call her; had reviewed her chart and had determined substance abuse was history of marijuana from previous pregnancy , not this pregnancy. She has removed that from her problem list.  I also reviewed her next ob appointment.  She states she is not sure if she will continue with this doctor. I explained we hope she doesn't transfer care ; but if she does that is her choice. I explained she can sign a release and we can transfer records if needed. She voices understanding and no other questions. Ursala Cressy,RN

## 2020-01-08 ENCOUNTER — Encounter: Payer: Self-pay | Admitting: Obstetrics and Gynecology

## 2020-01-08 DIAGNOSIS — O24419 Gestational diabetes mellitus in pregnancy, unspecified control: Secondary | ICD-10-CM | POA: Insufficient documentation

## 2020-01-09 ENCOUNTER — Telehealth: Payer: Self-pay

## 2020-01-09 DIAGNOSIS — O24410 Gestational diabetes mellitus in pregnancy, diet controlled: Secondary | ICD-10-CM

## 2020-01-09 NOTE — Telephone Encounter (Signed)
Pt notified that she had an abnormal GTT and she has gestational diabetes during this pregnancy.  Pt stated that she would be able to come in on 01/12/20 @ 0815.  Pt verbalized to all that was discussed and had no further questions.   Mel Almond, RN  01/09/20

## 2020-01-09 NOTE — Telephone Encounter (Signed)
-----   Message from Sloan Leiter, MD sent at 01/08/2020  5:49 PM EDT ----- Elevated 2 hr gTT, needs diabetic education

## 2020-01-12 ENCOUNTER — Encounter: Payer: Self-pay | Admitting: Skilled Nursing Facility1

## 2020-01-12 ENCOUNTER — Telehealth: Payer: Medicaid Other | Admitting: Family Medicine

## 2020-01-12 ENCOUNTER — Other Ambulatory Visit: Payer: Self-pay

## 2020-01-12 ENCOUNTER — Encounter: Payer: Medicaid Other | Attending: Obstetrics and Gynecology | Admitting: Skilled Nursing Facility1

## 2020-01-12 ENCOUNTER — Other Ambulatory Visit: Payer: Medicaid Other

## 2020-01-12 DIAGNOSIS — O24410 Gestational diabetes mellitus in pregnancy, diet controlled: Secondary | ICD-10-CM | POA: Insufficient documentation

## 2020-01-12 NOTE — Progress Notes (Signed)
Diabetes Self-Management Education  Visit Type: First/Initial   01/12/2020  Ms. [name], identified by name and date of birth, is a 34 y.o. female with a diagnosis of Diabetes: Gestational Diabetes.   ASSESSMENT  Height 5\' 1"  (1.549 m), weight 277 lb 14.4 oz (126.1 kg), unknown if currently breastfeeding. Body mass index is 52.51 kg/m.   Pt state she does not take her prenatal vitamin due to taking levothyroxine.  Pt states this is her third pregnancy with this being the first having GDM. Pt states she is due mid August. Pt states she only has high blood pressure when she goes into the doctors office. Pt states she feels fine so she is probably fine. Pt states she does not have much of an appetite. Pt states she is thirsty all the time and drinks 100 ounces per day. Pt states she does not feel hunger until the middle of the night. Pt states he has heart burn all day. Pt states one bite of food and she is full. Pt states she lost 5 pounds in the last week. Pt states her heartburn makes her cough and eats tums throughout the day.  Pt given accucheck guide me [id number] 03/23/21: checked 2 hours after a meal 110   Goals: Take a prenatal later in the day at least 4 hours from your levothyroxine  Eat every 3 hours smaller portions to help with reflux and sugar management  Do not drink 15 minutes before you do not drink with your meal and wait 30 minutes after you eat before you drink again  Do not eat fried foods: french fries, fried chicken  Make a smoothie: fruit, spinach, and soy milk and peanut butter Grapes in chicken salad Egg salad and crackers Cooked lunch meat in a wrap Avoid grapefruit, orange, lemon, lime, garlic, onion Aim to have non starchy vegetables 2 times a day 7 days a week Test your blood sugar 4 times a day: once before you eat anything in the morning and once after each meal Below 60 is low    Diabetes Self-Management Education - 01/12/20 1622      Visit  Information   Visit Type First/Initial      Initial Visit   Diabetes Type Gestational Diabetes    Are you currently following a meal plan? No    Are you taking your medications as prescribed? Not on Medications      Health Coping   How would you rate your overall health? Excellent      Psychosocial Assessment   Patient Belief/Attitude about Diabetes Denial    Self-care barriers None    Self-management support Family    Patient Concerns Nutrition/Meal planning    Special Needs None      Pre-Education Assessment   Patient understands the diabetes disease and treatment process. Needs Instruction    Patient understands incorporating nutritional management into lifestyle. Needs Instruction    Patient undertands incorporating physical activity into lifestyle. Needs Instruction    Patient understands using medications safely. Needs Instruction    Patient understands monitoring blood glucose, interpreting and using results Needs Instruction    Patient understands prevention, detection, and treatment of acute complications. Needs Instruction    Patient understands prevention, detection, and treatment of chronic complications. Needs Instruction    Patient understands how to develop strategies to address psychosocial issues. Needs Instruction    Patient understands how to develop strategies to promote health/change behavior. Needs Instruction      Complications  How often do you check your blood sugar? 0 times/day (not testing)    Number of hypoglycemic episodes per month 0    Number of hyperglycemic episodes per week 0      Dietary Intake   Breakfast skipped    Lunch 11-12: corn on the cob    Snack (afternoon) 2 boiled eggs    Dinner fast food    Beverage(s) water      Exercise   Exercise Type ADL's    How many days per week to you exercise? 0    How many minutes per day do you exercise? 0    Total minutes per week of exercise 0      Patient Education   Previous Diabetes  Education No    Disease state  Factors that contribute to the development of diabetes    Acute complications Taught treatment of hypoglycemia - the 15 rule.    Personal strategies to promote health Lifestyle issues that need to be addressed for better diabetes care      Individualized Goals (developed by patient)   Nutrition Follow meal plan discussed;General guidelines for healthy choices and portions discussed    Physical Activity Exercise 5-7 days per week;30 minutes per day    Monitoring  test my blood glucose as discussed      Post-Education Assessment   Patient understands the diabetes disease and treatment process. Demonstrates understanding / competency    Patient understands incorporating nutritional management into lifestyle. Demonstrates understanding / competency    Patient undertands incorporating physical activity into lifestyle. Demonstrates understanding / competency    Patient understands using medications safely. Demonstrates understanding / competency    Patient understands monitoring blood glucose, interpreting and using results Demonstrates understanding / competency    Patient understands prevention, detection, and treatment of acute complications. Demonstrates understanding / competency    Patient understands prevention, detection, and treatment of chronic complications. Demonstrates understanding / competency    Patient understands how to develop strategies to address psychosocial issues. Demonstrates understanding / competency    Patient understands how to develop strategies to promote health/change behavior. Demonstrates understanding / competency      Outcomes   Expected Outcomes Demonstrated interest in learning. Expect positive outcomes    Future DMSE PRN    Program Status Completed           Individualized Plan for Diabetes Self-Management Training:   Learning Objective:  Patient will have a greater understanding of diabetes self-management. Patient  education plan is to attend individual and/or group sessions per assessed needs and concerns.   Plan:   There are no Patient Instructions on file for this visit.  Expected Outcomes:  Demonstrated interest in learning. Expect positive outcomes  Education material provided: Meal plan card, My Plate and Snack sheet  If problems or questions, patient to contact team via:  Email  Future DSME appointment: PRN

## 2020-01-13 ENCOUNTER — Encounter: Payer: Medicaid Other | Admitting: Obstetrics and Gynecology

## 2020-01-31 ENCOUNTER — Other Ambulatory Visit: Payer: Self-pay | Admitting: Family Medicine

## 2020-03-06 NOTE — Patient Instructions (Signed)
[name]  03/06/2020   Your procedure is scheduled on:  03/20/2020  Arrive at [facility] at Entrance C on [address] at [facility]  and [address]. You are invited to use the FREE valet parking or use the Visitor's parking deck.  Pick up the phone at the desk and dial [phone].  Call this number if you have problems the morning of surgery: [phone]  Remember:   Do not eat food:(After Midnight) Desps de medianoche.  Do not drink clear liquids: (After Midnight) Desps de medianoche.  Take these medicines the morning of surgery with A SIP OF WATER:  Take labetalol and levothyroxine as prescribed   Do not wear jewelry, make-up or nail polish.  Do not wear lotions, powders, or perfumes. Do not wear deodorant.  Do not shave 48 hours prior to surgery.  Do not bring valuables to the hospital.  [facility] is not   responsible for any belongings or valuables brought to the hospital.  Contacts, dentures or bridgework may not be worn into surgery.  Leave suitcase in the car. After surgery it may be brought to your room.  For patients admitted to the hospital, checkout time is 11:00 AM the day of              discharge.      Please read over the following fact sheets that you were given:     Preparing for Surgery

## 2020-03-18 ENCOUNTER — Other Ambulatory Visit (HOSPITAL_COMMUNITY)
Admission: RE | Admit: 2020-03-18 | Discharge: 2020-03-18 | Disposition: A | Discharge status: Home / Self Care | Payer: Medicaid Other | Source: Ambulatory Visit | Attending: Obstetrics and Gynecology | Admitting: Obstetrics and Gynecology

## 2020-03-19 ENCOUNTER — Encounter (HOSPITAL_COMMUNITY): Payer: Self-pay | Admitting: Anesthesiology

## 2020-03-19 NOTE — Anesthesia Preprocedure Evaluation (Deleted)
Anesthesia Evaluation    Reviewed: Allergy & Precautions, Patient's Chart, lab work & pertinent test results  Airway        Dental   Pulmonary neg pulmonary ROS,           Cardiovascular hypertension, Pt. on medications   Echo 07/08/2018 Left ventricle: The cavity size was normal. There was mild  concentric hypertrophy. Systolic function was normal. The  estimated ejection fraction was in the range of 55% to 60%. Wall  motion was normal; there were no regional wall motion  abnormalities. Doppler parameters are consistent with abnormal  left ventricular relaxation (grade 1 diastolic dysfunction).  There was no evidence of elevated ventricular filling pressure by  Doppler parameters.    Neuro/Psych Anxiety negative neurological ROS     GI/Hepatic negative GI ROS, Neg liver ROS,   Endo/Other  diabetes, GestationalHypothyroidism   Renal/GU negative Renal ROS     Musculoskeletal   Abdominal (+) + obese,   Peds  Hematology Lab Results      Component                Value               Date                      WBC                      9.5                 01/05/2020                HGB                      10.7 (L)            01/05/2020                HCT                      30.3 (L)            01/05/2020                MCV                      89                  01/05/2020                PLT                      200                 01/05/2020              Anesthesia Other Findings   Reproductive/Obstetrics (+) Pregnancy and Breast feeding                              Anesthesia Physical Anesthesia Plan  ASA: III  Anesthesia Plan: Combined Spinal and Epidural   Post-op Pain Management:    Induction:   PONV Risk Score and Plan: 3 and Treatment may vary due to age or medical condition and Ondansetron  Airway Management Planned: Natural Airway  Additional Equipment:  None  Intra-op Plan:   Post-operative Plan:   Informed  Consent:     Dental advisory given  Plan Discussed with: CRNA and Anesthesiologist  Anesthesia Plan Comments:         Anesthesia Quick Evaluation

## 2020-03-20 ENCOUNTER — Inpatient Hospital Stay (HOSPITAL_COMMUNITY): Admission: RE | Admit: 2020-03-20 | Payer: Medicaid Other | Source: Home / Self Care | Admitting: Family Medicine

## 2020-03-20 ENCOUNTER — Encounter (HOSPITAL_COMMUNITY): Payer: Self-pay | Admitting: Family Medicine

## 2020-03-20 ENCOUNTER — Encounter (HOSPITAL_COMMUNITY): Admission: RE | Payer: Self-pay | Source: Home / Self Care

## 2020-03-20 SURGERY — Surgical Case
Anesthesia: Regional

## 2020-03-20 MED ORDER — LACTATED RINGERS IV SOLN: 125.0000 mL/h | INTRAVENOUS | Status: DC

## 2020-03-20 MED ORDER — POVIDONE-IODINE 10 % EX SWAB: 2.0000 "application " | Freq: Once | CUTANEOUS | Status: DC

## 2020-03-20 MED ORDER — CEFAZOLIN SODIUM-DEXTROSE 2-4 GM/100ML-% IV SOLN: 2.0000 g | INTRAVENOUS | Status: DC

## 2020-03-20 MED ORDER — SCOPOLAMINE 1 MG/3DAYS TD PT72: 1.0000 | MEDICATED_PATCH | Freq: Once | TRANSDERMAL | Status: DC

## 2020-04-01 ENCOUNTER — Encounter (HOSPITAL_COMMUNITY): Payer: Self-pay | Admitting: Obstetrics & Gynecology

## 2020-04-01 ENCOUNTER — Inpatient Hospital Stay (HOSPITAL_COMMUNITY)
Admission: AD | Admit: 2020-04-01 | Discharge: 2020-04-01 | Disposition: A | Discharge status: Home / Self Care | Payer: Medicaid Other | Attending: Obstetrics & Gynecology | Admitting: Obstetrics & Gynecology

## 2020-04-01 ENCOUNTER — Other Ambulatory Visit: Payer: Self-pay

## 2020-04-01 DIAGNOSIS — Z85850 Personal history of malignant neoplasm of thyroid: Secondary | ICD-10-CM | POA: Insufficient documentation

## 2020-04-01 DIAGNOSIS — O9089 Other complications of the puerperium, not elsewhere classified: Secondary | ICD-10-CM | POA: Diagnosis not present

## 2020-04-01 DIAGNOSIS — E079 Disorder of thyroid, unspecified: Secondary | ICD-10-CM | POA: Insufficient documentation

## 2020-04-01 DIAGNOSIS — K654 Sclerosing mesenteritis: Secondary | ICD-10-CM | POA: Insufficient documentation

## 2020-04-01 DIAGNOSIS — Z8249 Family history of ischemic heart disease and other diseases of the circulatory system: Secondary | ICD-10-CM | POA: Insufficient documentation

## 2020-04-01 DIAGNOSIS — Z79890 Hormone replacement therapy (postmenopausal): Secondary | ICD-10-CM | POA: Insufficient documentation

## 2020-04-01 DIAGNOSIS — Z7982 Long term (current) use of aspirin: Secondary | ICD-10-CM | POA: Insufficient documentation

## 2020-04-01 DIAGNOSIS — Z79899 Other long term (current) drug therapy: Secondary | ICD-10-CM | POA: Diagnosis not present

## 2020-04-01 DIAGNOSIS — I10 Essential (primary) hypertension: Secondary | ICD-10-CM | POA: Insufficient documentation

## 2020-04-01 DIAGNOSIS — O1093 Unspecified pre-existing hypertension complicating the puerperium: Secondary | ICD-10-CM | POA: Diagnosis not present

## 2020-04-01 LAB — COMPREHENSIVE METABOLIC PANEL
ALT: 9 U/L (ref 0–44)
AST: 14 U/L — ABNORMAL LOW (ref 15–41)
Albumin: 2.9 g/dL — ABNORMAL LOW (ref 3.5–5.0)
Alkaline Phosphatase: 78 U/L (ref 38–126)
Anion gap: 8 (ref 5–15)
BUN: 9 mg/dL (ref 6–20)
CO2: 26 mmol/L (ref 22–32)
Calcium: 8.6 mg/dL — ABNORMAL LOW (ref 8.9–10.3)
Chloride: 106 mmol/L (ref 98–111)
Creatinine, Ser: 0.83 mg/dL (ref 0.44–1.00)
GFR calc Af Amer: >60 mL/min (ref >60)
GFR calc non Af Amer: >60 mL/min (ref >60)
Glucose, Bld: 108 mg/dL — ABNORMAL HIGH (ref 70–99)
Potassium: 3.6 mmol/L (ref 3.5–5.1)
Sodium: 140 mmol/L (ref 135–145)
Total Bilirubin: 0.5 mg/dL (ref 0.3–1.2)
Total Protein: 6.6 g/dL (ref 6.5–8.1)

## 2020-04-01 LAB — CBC
HCT: 33.5 % — ABNORMAL LOW (ref 36.0–46.0)
Hemoglobin: 10.8 g/dL — ABNORMAL LOW (ref 12.0–15.0)
MCH: 28.4 pg (ref 26.0–34.0)
MCHC: 32.2 g/dL (ref 30.0–36.0)
MCV: 88.2 fL (ref 80.0–100.0)
Platelets: 362 10*3/uL (ref 150–400)
RBC: 3.8 MIL/uL — ABNORMAL LOW (ref 3.87–5.11)
RDW: 11.9 % (ref 11.5–15.5)
WBC: 7.6 10*3/uL (ref 4.0–10.5)
nRBC: 0 % (ref 0.0–0.2)

## 2020-04-01 MED ORDER — LABETALOL HCL 100 MG PO TABS
400.0000 mg | ORAL_TABLET | Freq: Once | ORAL | Status: AC
  Administered 2020-04-01: 400 mg via ORAL
  Filled 2020-04-01: qty 4

## 2020-04-01 MED ORDER — HYDRALAZINE HCL 20 MG/ML IJ SOLN: 10.0000 mg | INTRAMUSCULAR | Status: DC | PRN

## 2020-04-01 MED ORDER — LABETALOL HCL 5 MG/ML IV SOLN: 80.0000 mg | INTRAVENOUS | Status: DC | PRN

## 2020-04-01 MED ORDER — LABETALOL HCL 5 MG/ML IV SOLN: 20.0000 mg | INTRAVENOUS | Status: DC | PRN

## 2020-04-01 MED ORDER — LABETALOL HCL 5 MG/ML IV SOLN: 40.0000 mg | INTRAVENOUS | Status: DC | PRN

## 2020-04-01 NOTE — MAU Provider Note (Signed)
Patient [name] is a [id number]  at 3 weeks PP here for stomach pain. She had a repeat CS for worsening BP; did not receive MgSo4 while in hospital. She denies HA, blurry vision, sudden overall body swelling. She has "on and off again" bleeding. She states she is not concerned about her BP but she is concerned about her stomach pain.    Last night she felt strong abdominal pains last night, reports that they were "all night". She has CHTN, on 400 mg of labetalol twice a day; she has not taken her labetelol in 2 days. Reports that she has "a lot going on" at home and has not been able to take her medicine.    History     CSN: [id number]  Arrival date and time: 04/01/20 1134   None     Chief Complaint  Patient presents with  . Abdominal Pain  . Vaginal Bleeding   Abdominal Pain This is a new problem. The current episode started yesterday. The problem occurs intermittently. The pain is at a severity of 10/10 (comes and goes; when it comes, its a 10). The quality of the pain is cramping. Associated symptoms include vomiting. Pertinent negatives include no constipation, diarrhea, dysuria or nausea.    OB History    Gravida  4   Para  2   Term  2   Preterm      AB  1   Living  2     SAB  1   TAB      Ectopic      Multiple  0   Live Births  2           Past Medical History:  Diagnosis Date  . Anxiety   . Cancer ([place]) 02/2012   thyroid cancer  . Hypertension   . Preeclampsia in postpartum period 07/08/2018  . Severe Pre-eclampsia superimposed on chronic hypertension, delivered 07/01/2018  . Thyroid disease    cancer    Past Surgical History:  Procedure Laterality Date  . CESAREAN SECTION  2006  . CESAREAN SECTION N/A 07/01/2018   Procedure: CESAREAN SECTION;  Surgeon: Sloan Leiter, MD;  Location: [facility];  Service: Obstetrics;  Laterality: N/A;  . DILATION AND EVACUATION N/A 02/07/2015   Procedure: DILATATION AND EVACUATION;  Surgeon:  Lavonia Drafts, MD;  Location: [facility];  Service: Gynecology;  Laterality: N/A;  . THYROIDECTOMY  02/2012   total L side first and R side 7 days later.    Family History  Problem Relation Age of Onset  . Hypertension Mother   . Blindness Mother        one eye    Social History   Tobacco Use  . Smoking status: Never Smoker  . Smokeless tobacco: Never Used  Vaping Use  . Vaping Use: Never used  Substance Use Topics  . Alcohol use: No  . Drug use: Not Currently    Types: Marijuana    Comment: last used 05/19/18    Allergies: No Known Allergies  Medications Prior to Admission  Medication Sig Dispense Refill Last Dose  . aspirin 81 MG chewable tablet Chew 1 tablet (81 mg total) by mouth daily. 90 tablet 2   . famotidine (PEPCID) 20 MG tablet Take 1 tablet (20 mg total) by mouth daily. 60 tablet 1   . ferrous sulfate (FERROUSUL) 325 (65 FE) MG tablet Take 1 tablet (325 mg total) by mouth 2 (two) times daily. 60 tablet 1   .  labetalol (NORMODYNE) 200 MG tablet Take 2 tablets (400 mg total) by mouth 2 (two) times daily. 120 tablet 1   . levothyroxine (SYNTHROID) 200 MCG tablet Take 400 mcg by mouth daily.     . ondansetron (ZOFRAN ODT) 4 MG disintegrating tablet Take 1 tablet (4 mg total) by mouth every 8 (eight) hours as needed for nausea or vomiting. 30 tablet 0     Review of Systems  Constitutional: Negative.   HENT: Negative.   Respiratory: Negative.   Gastrointestinal: Positive for abdominal pain and vomiting. Negative for constipation, diarrhea and nausea.  Endocrine: Negative.   Genitourinary: Negative for dysuria.  Neurological: Negative.   Psychiatric/Behavioral: Negative.    Physical Exam   Blood pressure (!) 148/85, pulse 97, temperature 98.7 F (37.1 C), temperature source Oral, resp. rate 18, height 5\' 1"  (1.549 m), weight 122 kg, SpO2 100 %, unknown if currently breastfeeding.  Physical Exam Constitutional:      Appearance: She is well-developed.   HENT:     Head: Normocephalic.  Pulmonary:     Effort: Pulmonary effort is normal.  Abdominal:     General: Abdomen is flat.     Palpations: There is mass.     Tenderness: There is abdominal tenderness in the right upper quadrant and right lower quadrant.     Comments: Patient has an orange sized mass on the right side, above her incision. Tender to palpation, non-fluctuant. Does not radiate. Incision is clean, dry, well-healed.   Neurological:     Mental Status: She is alert.     MAU Course  Procedures  MDM -Dr. Elonda Husky at bedside to evaluate mass; posits that mass is most likely fat necrosis from pregnancy and pressure and c/section. Dr. Elonda Husky aware of patient's refusal of IV meds and current BPs.  -BP elevated while in MAU but patient refusing all IV meds; will take her daily dose of labetalol while in MAU. Dr. Elonda Husky made aware.   Patient Vitals for the past 24 hrs:  BP Temp Temp src Pulse Resp SpO2 Height Weight  04/01/20 1232 (!) 153/87 -- -- 86 -- -- -- --  04/01/20 1221 (!) 166/102 -- -- 90 -- -- -- --  04/01/20 1211 (!) 171/103 -- -- 98 -- -- -- --  04/01/20 1201 (!) 148/85 -- -- 97 -- -- -- --  04/01/20 1157 (!) 154/100 -- -- (!) 105 -- -- -- --  04/01/20 1149 (!) 176/120 98.7 F (37.1 C) Oral 99 18 100 % -- --  04/01/20 1145 -- -- -- -- -- -- 5\' 1"  (1.549 m) 122 kg     Assessment and Plan   1. 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 Patient desires discharge with labs pending; will notify patient if her lab work is concerning. Patient agrees to return if necessary.   3. Patient stable for discharge; all questions answered with strict pre-e warning signs reviewed. She is asymptomatic upon discharge.     Mervyn Skeeters Yuto C[place]uste 04/01/2020, 12:09 PM

## 2020-04-01 NOTE — MAU Note (Signed)
Patient declining IV start at this time. States to RN that she feels her BP is up due to her concern for the "knot" in her belly. [place] CNM in dept and aware.

## 2020-04-01 NOTE — MAU Note (Signed)
[name] is a 34 y.o. here in MAU reporting: s/p c/s on 03/09/20. States she was in severe pain all last night, pain is intermittent states but when it comes it is 10/10. States it feels like there is a knot in her stomach. States still bleeding and some days it is light and some days it is heavier.  Onset of complaint: yesterday  Pain score: 10/10  Vitals:   04/01/20 1149  BP: (!) 176/120  Pulse: 99  Resp: 18  Temp: 98.7 F (37.1 C)  SpO2: 100%     Lab orders placed from triage: none

## 2020-04-01 NOTE — Discharge Instructions (Signed)

## 2020-06-09 ENCOUNTER — Ambulatory Visit (HOSPITAL_COMMUNITY)
Admission: EM | Admit: 2020-06-09 | Discharge: 2020-06-09 | Disposition: A | Discharge status: Home / Self Care | Payer: Medicaid Other

## 2020-06-09 NOTE — ED Notes (Signed)
Per Dr. Mannie Stabile, pt was told to go to the ED to have the wound vac changed as she do not have the material to be change, and we do not have any material at the [facility].

## 2020-06-13 ENCOUNTER — Encounter (HOSPITAL_BASED_OUTPATIENT_CLINIC_OR_DEPARTMENT_OTHER): Payer: Medicaid Other | Admitting: Physician Assistant

## 2021-12-12 ENCOUNTER — Emergency Department (HOSPITAL_COMMUNITY)
Admission: EM | Admit: 2021-12-12 | Discharge: 2021-12-12 | Discharge status: Against Medical Advice | Payer: Medicaid Other | Attending: Emergency Medicine | Admitting: Emergency Medicine

## 2021-12-12 ENCOUNTER — Emergency Department (HOSPITAL_COMMUNITY): Payer: Medicaid Other

## 2021-12-12 ENCOUNTER — Encounter (HOSPITAL_COMMUNITY): Payer: Self-pay

## 2021-12-12 ENCOUNTER — Other Ambulatory Visit: Payer: Self-pay

## 2021-12-12 DIAGNOSIS — H4922 Sixth [abducent] nerve palsy, left eye: Secondary | ICD-10-CM

## 2021-12-12 DIAGNOSIS — H532 Diplopia: Secondary | ICD-10-CM | POA: Insufficient documentation

## 2021-12-12 DIAGNOSIS — Z7982 Long term (current) use of aspirin: Secondary | ICD-10-CM | POA: Insufficient documentation

## 2021-12-12 DIAGNOSIS — Y990 Civilian activity done for income or pay: Secondary | ICD-10-CM | POA: Insufficient documentation

## 2021-12-12 DIAGNOSIS — W19XXXA Unspecified fall, initial encounter: Secondary | ICD-10-CM | POA: Diagnosis not present

## 2021-12-12 DIAGNOSIS — R55 Syncope and collapse: Secondary | ICD-10-CM | POA: Insufficient documentation

## 2021-12-12 LAB — I-STAT CHEM 8, ED
BUN: 16 mg/dL (ref 6–20)
Calcium, Ion: 1.13 mmol/L — ABNORMAL LOW (ref 1.15–1.40)
Chloride: 106 mmol/L (ref 98–111)
Creatinine, Ser: 0.9 mg/dL (ref 0.44–1.00)
Glucose, Bld: 101 mg/dL — ABNORMAL HIGH (ref 70–99)
HCT: 37 % (ref 36.0–46.0)
Hemoglobin: 12.6 g/dL (ref 12.0–15.0)
Potassium: 3.5 mmol/L (ref 3.5–5.1)
Sodium: 142 mmol/L (ref 135–145)
TCO2: 25 mmol/L (ref 22–32)

## 2021-12-12 LAB — CBG MONITORING, ED: Glucose-Capillary: 118 mg/dL — ABNORMAL HIGH (ref 70–99)

## 2021-12-12 MED ORDER — LABETALOL HCL 200 MG PO TABS
200.0000 mg | ORAL_TABLET | Freq: Once | ORAL | Status: AC
  Administered 2021-12-12: 200 mg via ORAL
  Filled 2021-12-12: qty 1

## 2021-12-12 MED ORDER — IOHEXOL 350 MG/ML SOLN
75.0000 mL | Freq: Once | INTRAVENOUS | Status: AC | PRN
  Administered 2021-12-12: 75 mL via INTRAVENOUS

## 2021-12-12 NOTE — ED Notes (Signed)
Neurology at bedside.

## 2021-12-12 NOTE — ED Provider Notes (Signed)
Blood pressure (!) 194/110, pulse 83, temperature 97.9 ?F (36.6 ?C), temperature source Oral, resp. rate 14, SpO2 98 %, unknown if currently breastfeeding. ? ?Assuming care from Dr. Roslynn Amble.  In short, [name] is a 36 y.o. female with a chief complaint of Fall, Near Syncopal Episode, and Double Vision ?Marland Kitchen  Refer to the original H&P for additional details. ? ?The current plan of care is to follow up on MRI. ? ?04:14 PM ?Made aware by nursing that patient must urgently leave the ED. I went to speak with her and she tells me that her children are at school and the school cannot release them to her other family members.  She states she would like to stay for imaging but cannot due to this conflict and must leave immediately.  She will leave [facility] but understands that she can return at any time to complete her work-up.  I have also placed a referral in our system for neurology as a backup plan but advised her to return at her earliest convenience for imaging. ? ?  ?Margette Fast, MD ?12/13/21 1738 ? ?

## 2021-12-12 NOTE — ED Triage Notes (Signed)
Pt BIB [facility] from work d/t a fall from a possible near syncopal episode. Pt states she had a HA before this all started felt like she was going to vomit, her eyes "went cross" & knew her sugar was low because she hadn't ate yet at that moment. EMS reports her initial BP on scene was 160 palpated & then came down to 130/120, CBG 115. 20g PIV Lt AC. Upon arrival to ED pt is still seeing double vision, A/Ox4.  ?

## 2021-12-12 NOTE — Discharge Instructions (Addendum)
Dear [name], ? ?You were in the emergency department because you passed out and were having double vision. The neurologists evaluated you while you were here and recommend that you seen an ophthalmologist (eye doctor). Please call Dr. Zenia Resides office to make an appointment to be seen.  ? ?You are choosing to leave [facility] prior to obtaining MRI studies.  As we discussed this could delay diagnosis of a potentially serious condition.  Please call your primary care doctor first thing tomorrow.  I have placed a referral for a neurologist in our system.  Please call the office tomorrow to schedule the next available follow-up appointment.  ?

## 2021-12-12 NOTE — Consult Note (Addendum)
NEUROLOGY CONSULTATION NOTE  ? ?Date of service: Dec 12, 2021 ?Patient Name: [name] ?MRN:  [id number] ?DOB:  04/10/1986 ?Reason for consult: "Diplopia" ?_ _ _   _ __   _ __ _ _  __ __   _ __   __ _ ? ?History of Present Illness  ?[name] is a 36 y.o. female with PMH significant for  has a past medical history of Anxiety, Cancer ([place]) (02/2012), Hypertension, Preeclampsia in postpartum period (07/08/2018), Severe Pre-eclampsia superimposed on chronic hypertension, delivered (07/01/2018), and Thyroid disease. who presents after a syncopal episode with diplopia. ? ?Patient states that she was at work when she is developed shakiness, nausea and emesis and subsequently had a syncopal episode.  Patient next remembers being on the ground with people surrounding her.  Denies any postictal symptoms.  She states she continues to have diplopia.  She states this has been present since roughly 2013 when she had her total thyroidectomy for thyroid floccular cancer.  Otherwise she denies any other focal neurologic deficits.  She denies any new medications, illicit drugs, alcohol use, chest pain, palpitations. ? ? ? ?ROS  ?See above ? ?Past History  ? ?Past Medical History:  ?Diagnosis Date  ? Anxiety   ? Cancer ([place]) 02/2012  ? thyroid cancer  ? Hypertension   ? Preeclampsia in postpartum period 07/08/2018  ? Severe Pre-eclampsia superimposed on chronic hypertension, delivered 07/01/2018  ? Thyroid disease   ? cancer  ? ?Past Surgical History:  ?Procedure Laterality Date  ? CESAREAN SECTION  2006  ? CESAREAN SECTION N/A 07/01/2018  ? Procedure: CESAREAN SECTION;  Surgeon: Sloan Leiter, MD;  Location: [facility];  Service: Obstetrics;  Laterality: N/A;  ? DILATION AND EVACUATION N/A 02/07/2015  ? Procedure: DILATATION AND EVACUATION;  Surgeon: Lavonia Drafts, MD;  Location: [facility];  Service: Gynecology;  Laterality: N/A;  ? THYROIDECTOMY  02/2012  ? total L side first and R side 7 days later.   ? ?Family History  ?Problem Relation Age of Onset  ? Hypertension Mother   ? Blindness Mother   ?     one eye  ? ?Social History  ? ?Socioeconomic History  ? Marital status: Single  ?  Spouse name: Not on file  ? Number of children: Not on file  ? Years of education: Not on file  ? Highest education level: Not on file  ?Occupational History  ? Not on file  ?Tobacco Use  ? Smoking status: Never  ? Smokeless tobacco: Never  ?Vaping Use  ? Vaping Use: Never used  ?Substance and Sexual Activity  ? Alcohol use: No  ? Drug use: Not Currently  ?  Types: Marijuana  ?  Comment: last used 05/19/18  ? Sexual activity: Yes  ?  Birth control/protection: None  ?Other Topics Concern  ? Not on file  ?Social History Narrative  ? Not on file  ? ?Social Determinants of Health  ? ?Financial Resource Strain: Not on file  ?Food Insecurity: Not on file  ?Transportation Needs: Not on file  ?Physical Activity: Not on file  ?Stress: Not on file  ?Social Connections: Not on file  ? ?No Known Allergies ? ?Medications  ?(Not in a hospital admission) ?  ? ?Vitals  ? ?Vitals:  ? 12/12/21 1216 12/12/21 1217 12/12/21 1230  ?BP: (!) 192/113  (!) 193/112  ?Resp: '16 10 19  '$ ?Temp: 98.3 ?F (36.8 ?C)    ?TempSrc: Oral    ?SpO2:  98%  98%  ?  ? ?There is no height or weight on file to calculate BMI. ? ?Physical Exam  ? ?General: Laying comfortably in bed; in no acute distress.  ?HENT: Normal oropharynx and mucosa. Normal external appearance of ears and nose.  ?Neck: Supple, no pain or tenderness  ?CV: RRR. No peripheral edema.  ?Pulmonary: Symmetric Chest rise. Normal respiratory effort.  ?Ext: No cyanosis, edema, or deformity  ?Skin: No rash. Normal palpation of skin.   ?Musculoskeletal: Normal digits and nails by inspection. No clubbing.  ? ?Neurologic Examination  ?Mental status/Cognition: Alert, oriented to self, place, month and year, good attention.  ?Speech/language: Fluent, comprehension intact, object naming intact, repetition intact.  ?Cranial  nerves:  ? CN II Pupils equal and reactive to light, no VF deficits   ? CN III,IV,VI EOM intact with lateral rectus weakness evident by change in left corneal iron reflux., no gaze preference or deviation, no nystagmus   ? CN V normal sensation in V1, V2, and V3 segments bilaterally   ? CN VII no asymmetry, no nasolabial fold flattening   ? CN VIII normal hearing to speech   ? CN IX & X normal palatal elevation, no uvular deviation   ? CN XI 5/5 head turn and 5/5 shoulder shrug bilaterally   ? CN XII midline tongue protrusion   ? ?Motor:  ?Normal muscle strength in upper and lower extremities ? ?Sensation: ?Normal sensation in upper and lower extremities. ? ?Coordination/Complex Motor:  ?- Finger to Nose intact ?- Heel to shin intact ?- Rapid alternating movement intact ? ? ?Labs  ? ?CBC: No results for input(s): WBC, NEUTROABS, HGB, HCT, MCV, PLT in the last 168 hours. ? ?Basic Metabolic Panel:  ?Lab Results  ?Component Value Date  ? NA 140 04/01/2020  ? K 3.6 04/01/2020  ? CO2 26 04/01/2020  ? GLUCOSE 108 (H) 04/01/2020  ? BUN 9 04/01/2020  ? CREATININE 0.83 04/01/2020  ? CALCIUM 8.6 (L) 04/01/2020  ? GFRNONAA >60 04/01/2020  ? GFRAA >60 04/01/2020  ? ?Lipid Panel: No results found for: [place] ?HgbA1c:  ?Lab Results  ?Component Value Date  ? HGBA1C 5.3 12/15/2019  ? ?Urine Drug Screen:  ?   ?Component Value Date/Time  ? [place] DETECTED 06/04/2018 2018  ? [place] DETECTED 06/04/2018 2018  ? [place] DETECTED 06/04/2018 2018  ? AMPHETMU NONE DETECTED 06/04/2018 2018  ? THCU POSITIVE (A) 06/04/2018 2018  ? [place] DETECTED 06/04/2018 2018  ?  ?Alcohol Level No results found for: ETH ? ?CT angio Head and Neck with contrast: ?Pending ? ? ?Impression  ? ?Isolated cranial nerve VI dysfunction ?Presents with weakness of the left lateral rectus with diplopia concerning for an isolated cranial nerve VI palsy.  Patient originally stated that this has been present for some time but was unable to  clarify the chronicity of her symptoms.  She has no other focal neurologic deficits on exam.  Discussed the need for further imaging to rule out posterior fossa mass or aneurysm . If negative would recommend a lumbar puncture to rule out idiopathic intracranial hypertension. ? ? ?Recommendations  ? ?Cranial nerve 6 dysfunction    ?- CTA pending  ?- MRI orbits w and w/o contrast pending  ?- Recommended LP If MRI is negative but patient decline  ?- Would recommend further OP management with optho  ?______________________________________________________________________ ? ? ?Thank you for the opportunity to take part in the care of this patient. If you have  any further questions, please contact the neurology consultation attending. ? ?Signed, ? ?Lawerance Cruel, D.O.  ?Internal Medicine Resident, PGY-3 ?[facility]  ?2:56 PM, 12/12/2021  ? ? ?

## 2021-12-12 NOTE — ED Provider Notes (Cosign Needed)
?[facility] ?Provider Note ? ? ?CSN: [id number] ?Arrival date & time: 12/12/21  1215 ? ?  ? ?History ? ?Chief Complaint  ?Patient presents with  ? Fall  ? Near Syncopal Episode  ? Double Vision  ? ? ?[name] is a 36 y.o. female who presents to the ED after a syncopal episode vs questionable seizure. The patient states that [name] was at work and started to feel nauseous and began to have double vision. [name] states that the next thing [name] remembers is that [name] woke up and saw EMS around [name]. The patient does not remember what happened in between. [name] states that [name] coworker told [name] [name] may have had a seizure and possibly bit [name] tongue. The patient states that [name] was confused for a few minutes after this episode occurred and [name] was vomiting afterwards. [name] has a history of a seizure about 10 years ago, but this was attributed to [name] being on phentermine for weight loss. The patient continues to endorse double vision, but otherwise denies any fevers, chills, dizziness, lightheadedness, chest pain, SOB, abd pain, nausea, diarrhea, melena, hematochezia, dysuria, or hematuria.  ? ? ?Fall ?Pertinent negatives include no chest pain, no abdominal pain, no headaches and no shortness of breath.  ? ?  ? ?Home Medications ?Prior to Admission medications   ?Medication Sig Start Date End Date Taking? Authorizing Provider  ?aspirin 81 MG chewable tablet Chew 1 tablet (81 mg total) by mouth daily. 12/15/19   Donnamae Jude, MD  ?famotidine (PEPCID) 20 MG tablet Take 1 tablet (20 mg total) by mouth daily. 01/05/20   Sloan Leiter, MD  ?ferrous sulfate (FERROUSUL) 325 (65 FE) MG tablet Take 1 tablet (325 mg total) by mouth 2 (two) times daily. 12/29/19   Jorje Guild, NP  ?labetalol (NORMODYNE) 200 MG tablet Take 2 tablets (400 mg total) by mouth 2 (two) times daily. 12/15/19   Donnamae Jude, MD  ?levothyroxine (SYNTHROID) 200 MCG tablet Take 400 mcg by mouth daily. 11/20/19   [provider]  ?ondansetron (ZOFRAN ODT) 4 MG disintegrating tablet Take 1 tablet (4 mg total) by mouth every 8 (eight) hours as needed for nausea or vomiting. 01/05/20   Sloan Leiter, MD  ?   ? ?Allergies    ?Patient has no known allergies.   ? ?Review of Systems   ?Review of Systems  ?Constitutional:  Negative for chills and fever.  ?HENT: Negative.    ?Eyes:  Positive for visual disturbance. Negative for photophobia and pain.  ?     + double vision  ?Respiratory:  Negative for shortness of breath.   ?Cardiovascular:  Negative for chest pain and palpitations.  ?Gastrointestinal:  Positive for vomiting. Negative for abdominal pain, diarrhea and nausea.  ?Genitourinary:  Negative for dysuria and hematuria.  ?Musculoskeletal:  Negative for back pain and neck pain.  ?Neurological:  Negative for dizziness, light-headedness and headaches.  ? ?Physical Exam ?Updated Vital Signs ?BP (!) 192/113 (BP Location: Right Arm)   Temp 98.3 ?F (36.8 ?C) (Oral)   Resp 10   SpO2 98%  ?Physical Exam ?Constitutional:   ?   General: [name] is not in acute distress. ?   Appearance: Normal appearance. [name] is normal weight.  ?HENT:  ?   Head: Normocephalic and atraumatic.  ?Eyes:  ?   Pupils: Pupils are equal, round, and reactive to light.  ?   Comments: Slight delay in EOM in left eye.  Left eye appears to be deviated down and inwards slightly compared to right   ?Cardiovascular:  ?   Rate and Rhythm: Normal rate and regular rhythm.  ?   Pulses: Normal pulses.  ?   Heart sounds: No murmur heard. ?Pulmonary:  ?   Effort: Pulmonary effort is normal. No respiratory distress.  ?   Breath sounds: Normal breath sounds. No wheezing, rhonchi or rales.  ?Abdominal:  ?   General: Bowel sounds are normal. There is no distension.  ?   Palpations: Abdomen is soft.  ?   Tenderness: There is no abdominal tenderness.  ?Musculoskeletal:  ?   Right lower leg: No edema.  ?   Left lower leg: No edema.  ?Skin: ?   General: Skin is warm and dry.   ?Neurological:  ?   Mental Status: [name] is alert and oriented to person, place, and time.  ?   Sensory: No sensory deficit.  ?   Motor: No weakness.  ?   Coordination: Coordination normal.  ?   Comments: CN II-XII intact, aside from questionable left CN VI palsy   ?Psychiatric:     ?   Mood and Affect: Mood normal.     ?   Behavior: Behavior normal.  ? ? ?ED Results / Procedures / Treatments   ?Labs ?(all labs ordered are listed, but only abnormal results are displayed) ?Labs Reviewed  ?CBG MONITORING, ED - Abnormal; Notable for the following components:  ?    Result Value  ? Glucose-Capillary 118 (*)   ? All other components within normal limits  ?I-STAT CHEM 8, ED  ? ? ?EKG ?EKG Interpretation ? ?Date/Time:  Thursday Dec 12 2021 12:16:40 EDT ?Ventricular Rate:  83 ?PR Interval:  172 ?QRS Duration: 96 ?QT Interval:  372 ?QTC Calculation: 438 ?R Axis:   15 ?Text Interpretation: Sinus rhythm Left ventricular hypertrophy Borderline T abnormalities, diffuse leads Confirmed by Madalyn Rob [phone]) on 12/12/2021 12:19:26 PM ? ?Radiology ?No results found. ? ?Procedures ?Procedures  ? ? ?Medications Ordered in ED ?Medications  ?labetalol (NORMODYNE) tablet 200 mg (has no administration in time range)  ? ? ?ED Course/ Medical Decision Making/ A&P ?  ?                        ?Medical Decision Making ?Amount and/or Complexity of Data Reviewed ?Radiology: ordered. ? ?Risk ?Prescription drug management. ? ? ?Batya D Daffron is a 36 yo female who presents to the ED after a syncopal episode vs questionable seizure. The patient continues to endorse double vision, but otherwise is feeling well. Upon EOM testing, the patient's left eye is slightly delayed in movement compared to the right. While looking straight ahead, the left eye is slightly deviated down and inwards. Discussed case with on-call neurology, who will evaluate the patient.  ? ?Upon further investigation, the patient's family member clarifies that the patient  has been experiencing intermittent diplopia for ~ 13 years, since [name] was diagnosed with thyroid cancer. Neurology evaluated the patient and state that the patient needs [name] isolated CN VI palsy worked up further. Will obtain MRI brain and MRI orbits with and without contrast, per neurology recommendations. Will likely discharge the patient afterwards, however, will obtain imaging first. Patient signed out to Dr. Laverta Baltimore at shift change.  ? ? ? ? ? ? ? ?Final Clinical Impression(s) / ED Diagnoses ?Final diagnoses:  ?None  ? ? ?Rx / DC Orders ?ED Discharge Orders   ? ?  None  ? ?  ? ? ?  ?Dorethea Clan, DO ?12/12/21 1507 ? ?

## 2021-12-23 ENCOUNTER — Telehealth (HOSPITAL_COMMUNITY): Payer: Self-pay | Admitting: Emergency Medicine

## 2021-12-23 DIAGNOSIS — Z20818 Contact with and (suspected) exposure to other bacterial communicable diseases: Secondary | ICD-10-CM

## 2021-12-23 DIAGNOSIS — B9689 Other specified bacterial agents as the cause of diseases classified elsewhere: Secondary | ICD-10-CM

## 2021-12-23 MED ORDER — CEFDINIR 300 MG PO CAPS: 300.0000 mg | ORAL_CAPSULE | Freq: Two times a day (BID) | ORAL | 0 refills | Status: DC

## 2021-12-23 NOTE — Telephone Encounter (Signed)
Children exposed to both of her children who have strep throat.  Patient provided with cefdinir.

## 2021-12-24 ENCOUNTER — Telehealth (HOSPITAL_COMMUNITY): Payer: Self-pay

## 2021-12-24 ENCOUNTER — Ambulatory Visit (HOSPITAL_COMMUNITY)
Admission: EM | Admit: 2021-12-24 | Discharge: 2021-12-24 | Disposition: A | Discharge status: Home / Self Care | Payer: Medicaid Other | Attending: Internal Medicine | Admitting: Internal Medicine

## 2021-12-24 ENCOUNTER — Encounter (HOSPITAL_COMMUNITY): Payer: Self-pay

## 2021-12-24 DIAGNOSIS — J029 Acute pharyngitis, unspecified: Secondary | ICD-10-CM

## 2021-12-24 DIAGNOSIS — H65111 Acute and subacute allergic otitis media (mucoid) (sanguinous) (serous), right ear: Secondary | ICD-10-CM | POA: Diagnosis not present

## 2021-12-24 DIAGNOSIS — R051 Acute cough: Secondary | ICD-10-CM

## 2021-12-24 DIAGNOSIS — J209 Acute bronchitis, unspecified: Secondary | ICD-10-CM | POA: Diagnosis not present

## 2021-12-24 MED ORDER — PREDNISONE 20 MG PO TABS: 40.0000 mg | ORAL_TABLET | Freq: Every day | ORAL | 0 refills | Status: AC

## 2021-12-24 MED ORDER — METHYLPREDNISOLONE SODIUM SUCC 125 MG IJ SOLR
INTRAMUSCULAR | Status: AC
  Filled 2021-12-24: qty 2

## 2021-12-24 MED ORDER — DOXYCYCLINE HYCLATE 100 MG PO CAPS: 100.0000 mg | ORAL_CAPSULE | Freq: Two times a day (BID) | ORAL | 0 refills | Status: DC

## 2021-12-24 MED ORDER — CEFDINIR 300 MG PO CAPS: 300.0000 mg | ORAL_CAPSULE | Freq: Two times a day (BID) | ORAL | 0 refills | Status: DC

## 2021-12-24 MED ORDER — METHYLPREDNISOLONE SODIUM SUCC 125 MG IJ SOLR
80.0000 mg | Freq: Once | INTRAMUSCULAR | Status: AC
  Administered 2021-12-24: 80 mg via INTRAMUSCULAR

## 2021-12-24 MED ORDER — FLUTICASONE PROPIONATE 50 MCG/ACT NA SUSP: 1.0000 | Freq: Every day | NASAL | 2 refills | Status: AC

## 2021-12-24 MED ORDER — CEFDINIR 300 MG PO CAPS: 300.0000 mg | ORAL_CAPSULE | Freq: Two times a day (BID) | ORAL | 0 refills | Status: AC

## 2021-12-24 MED ORDER — GUAIFENESIN ER 1200 MG PO TB12: 1200.0000 mg | ORAL_TABLET | Freq: Two times a day (BID) | ORAL | 0 refills | Status: AC

## 2021-12-24 NOTE — ED Provider Notes (Signed)
[place]    CSN: [id number] Arrival date & time: 12/24/21  0908      History   Chief Complaint Chief Complaint  Patient presents with   Cough   Sore Throat    HPI [name] is a 36 y.o. female.   Patient presents to urgent care for evaluation of her sore throat, cough, nasal congestion, and fatigue that she has had for the last few days.  She states that her sore throat became significantly worse overnight last night to the point where it is very swollen and difficult to swallow.  Both of her children have tested positive for strep throat and are being treated currently with antibiotics.  She was seen 3 weeks ago for an ear infection to her left ear and was prescribed Augmentin.  She reports lingering ear pain to the left ear and some new ear pain to the right ear.  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  Denies nausea, vomiting, diarrhea, constipation, urinary symptoms, dizziness, and headache.  Denies shortness of breath and difficulty breathing due to throat pain.  States it is very difficult to swallow and eat food due to pain.  She has been taking liquid ibuprofen 600 mg since yesterday for throat pain with minimal relief.  Denies history of lung problems or asthma.  Cough is productive with purulent sputum.  No fever at home, but does report chills..  Denies any other aggravating or relieving factors at this time.   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    Gestational diabetes 01/08/2020   Abnormal genetic test 01/05/2020   History of marijuana use 01/05/2020    History of severe pre-eclampsia 12/28/2019   Supervision of high risk pregnancy, antepartum 12/15/2019   Obesity, Class III, BMI 40-49.9 (morbid obesity) ([place]) 12/30/2017   Obesity affecting pregnancy, antepartum 12/30/2017   Hypothyroidism during pregnancy, antepartum 12/30/2017   Chronic hypertension in pregnancy 12/16/2017   History of cesarean delivery 12/16/2017   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   Hematuria 11/11/2017   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    OB History     Gravida  4   Para  2   Term  2   Preterm      AB  1   Living  2      SAB  1   IAB      Ectopic      Multiple  0   Live Births  2  Home Medications    Prior to Admission medications   Medication Sig Start Date End Date Taking? Authorizing Provider  cefdinir (OMNICEF) 300 MG capsule Take 1 capsule (300 mg total) by mouth 2 (two) times daily for 10 days. 12/24/21 01/03/22 Yes Dorn Hartshorne, Stasia Cavalier, FNP  fluticasone (FLONASE) 50 MCG/ACT nasal spray Place 1 spray into both nostrils daily. 12/24/21  Yes Talbot Grumbling, FNP  Guaifenesin 1200 MG TB12 Take 1 tablet (1,200 mg total) by mouth in the morning and at bedtime. 12/24/21  Yes Talbot Grumbling, FNP  predniSONE (DELTASONE) 20 MG tablet Take 2 tablets (40 mg total) by mouth daily for 5 days. 12/24/21 12/29/21 Yes StanhopeStasia Cavalier, FNP  aspirin 81 MG chewable tablet Chew 1 tablet  (81 mg total) by mouth daily. 12/15/19   Donnamae Jude, MD  famotidine (PEPCID) 20 MG tablet Take 1 tablet (20 mg total) by mouth daily. 01/05/20   Sloan Leiter, MD  ferrous sulfate (FERROUSUL) 325 (65 FE) MG tablet Take 1 tablet (325 mg total) by mouth 2 (two) times daily. 12/29/19   Jorje Guild, NP  labetalol (NORMODYNE) 200 MG tablet Take 2 tablets (400 mg total) by mouth 2 (two) times daily. 12/15/19   Donnamae Jude, MD  levothyroxine (SYNTHROID) 200 MCG tablet Take 400 mcg by mouth daily. 11/20/19   [provider]  ondansetron (ZOFRAN ODT) 4 MG disintegrating tablet Take 1 tablet (4 mg total) by mouth every 8 (eight) hours as needed for nausea or vomiting. 01/05/20   Sloan Leiter, MD    Family History Family History  Problem Relation Age of Onset   Hypertension Mother    Blindness Mother        one eye    Social History Social History   Tobacco Use   Smoking status: Never   Smokeless tobacco: Never  Vaping Use   Vaping Use: Never used  Substance Use Topics   Alcohol use: No   Drug use: Not Currently    Types: Marijuana    Comment: last used 05/19/18     Allergies   Patient has no known allergies.   Review of Systems Review of Systems  Respiratory:  Positive for cough.   Per HPI  Physical Exam Triage Vital Signs ED Triage Vitals  Enc Vitals Group     BP 12/24/21 1007 91/63     Pulse Rate 12/24/21 1007 92     Resp 12/24/21 1007 18     Temp 12/24/21 1007 98.4 F (36.9 C)     Temp Source 12/24/21 1007 Oral     SpO2 12/24/21 1007 100 %     Weight --      Height --      Head Circumference --      Peak Flow --      Pain Score 12/24/21 1008 10     Pain Loc --      Pain Edu? --      Excl. in [place]? --    No data found.  Updated Vital Signs BP 91/63 (BP Location: Left Arm)   Pulse 92   Temp 98.4 F (36.9 C) (Oral)   Resp 18   LMP 12/23/2021 (Approximate)   SpO2 100%   Visual Acuity Right Eye Distance:   Left Eye Distance:   Bilateral  Distance:    Right Eye Near:   Left Eye Near:    Bilateral Near:     Physical Exam Vitals and nursing note  reviewed.  Constitutional:      General: She is not in acute distress.    Appearance: Normal appearance. She is well-developed. She is not ill-appearing.  HENT:     Head: Normocephalic and atraumatic.     Right Ear: Ear canal and external ear normal. Drainage present. Tympanic membrane is erythematous and bulging.     Left Ear: Tympanic membrane, ear canal and external ear normal.     Ears:     Comments: Purulent drainage noted from right TM.    Nose: Nose normal.     Mouth/Throat:     Mouth: Mucous membranes are moist. No oral lesions.     Pharynx: Uvula midline. Pharyngeal swelling and posterior oropharyngeal erythema present. No uvula swelling.     Tonsils: No tonsillar exudate or tonsillar abscesses. 2+ on the right. 2+ on the left.     Comments: Postnasal drainage present to posterior oropharynx.  No peritonsillar abscess visualized.  Airway is intact. Eyes:     Extraocular Movements: Extraocular movements intact.     Conjunctiva/sclera: Conjunctivae normal.     Pupils: Pupils are equal, round, and reactive to light.  Cardiovascular:     Rate and Rhythm: Normal rate and regular rhythm.     Heart sounds: Normal heart sounds. No murmur heard.   No friction rub. No gallop.  Pulmonary:     Effort: Pulmonary effort is normal. No tachypnea, accessory muscle usage, prolonged expiration or respiratory distress.     Breath sounds: Normal air entry. Examination of the right-upper field reveals rhonchi. Examination of the left-upper field reveals rhonchi. Examination of the right-middle field reveals rhonchi. Examination of the left-middle field reveals rhonchi. Rhonchi present. No decreased breath sounds, wheezing or rales.     Comments: Patient is not in any respiratory distress at this time and is able to take in deep breaths and exhale completely without difficulty. Chest:      Chest wall: No tenderness.  Abdominal:     Palpations: Abdomen is soft.     Tenderness: There is no abdominal tenderness. There is no right CVA tenderness or left CVA tenderness.  Musculoskeletal:        General: No swelling.     Cervical back: Normal range of motion and neck supple.  Lymphadenopathy:     Cervical: Cervical adenopathy present.  Skin:    General: Skin is warm and dry.     Capillary Refill: Capillary refill takes less than 2 seconds.     Findings: No rash.  Neurological:     General: No focal deficit present.     Mental Status: She is alert and oriented to person, place, and time.  Psychiatric:        Mood and Affect: Mood normal.        Behavior: Behavior normal.        Thought Content: Thought content normal.        Judgment: Judgment normal.     UC Treatments / Results  Labs (all labs ordered are listed, but only abnormal results are displayed) Labs Reviewed - No data to display  EKG   Radiology No results found.  Procedures Procedures (including critical care time)  Medications Ordered in UC Medications  methylPREDNISolone sodium succinate (SOLU-MEDROL) 125 mg/2 mL injection 80 mg (has no administration in time range)    Initial Impression / Assessment and Plan / UC Course  I have reviewed the triage vital signs and the nursing notes.  Pertinent labs & imaging results  that were available during my care of the patient were reviewed by me and considered in my medical decision making (see chart for details).  Patient is a 36 year old female presenting to urgent care with upper respiratory symptoms  *** Final Clinical Impressions(s) / UC Diagnoses   Final diagnoses:  Acute mucoid otitis media of right ear  Sore throat  Acute cough     Discharge Instructions      You are seen urgent care today for your right-sided ear infection and sore throat.  I have prescribed cefdinir antibiotic for you to take 2 times daily for the next 10 days. This  will cover your ear infection and possible strep throat. Take guaifenesin 2 times daily for the next 5-7 days to thin your mucous to be able to cough it up and blow it out of your nose easier.  Please 1 spray of Flonase into each nostril daily.  Take 40 mg of prednisone once daily with breakfast for 5 days.  If your symptoms or not improving in the next 5 to 7 days, please return to urgent care for further evaluation.  I have given you a work note to return on Monday, May 29.  Please go home and rest and drink plenty of water.  If you develop any new or worsening symptoms or do not improve in the next 2 to 3 days, please return.  If your symptoms are severe, please go to the emergency room.  Follow-up with your primary care provider for further evaluation and management of your symptoms as well as ongoing wellness visits.  I hope you feel better!   ED Prescriptions     Medication Sig Dispense Auth. Provider   doxycycline (VIBRAMYCIN) 100 MG capsule  (Status: Discontinued) Take 1 capsule (100 mg total) by mouth 2 (two) times daily. 20 capsule Joella Prince M, FNP   Guaifenesin 1200 MG TB12 Take 1 tablet (1,200 mg total) by mouth in the morning and at bedtime. 14 tablet Joella Prince M, FNP   predniSONE (DELTASONE) 20 MG tablet Take 2 tablets (40 mg total) by mouth daily for 5 days. 10 tablet Joella Prince M, FNP   fluticasone [facility]) 50 MCG/ACT nasal spray Place 1 spray into both nostrils daily. 16 g Joella Prince M, FNP   cefdinir (OMNICEF) 300 MG capsule Take 1 capsule (300 mg total) by mouth 2 (two) times daily for 10 days. 20 capsule Talbot Grumbling, FNP      PDMP not reviewed this encounter.

## 2021-12-24 NOTE — ED Triage Notes (Signed)
Pt states sore throat and cough since last pm. States she feels like her tonsils are swollen. Tried motrin last pm with no relief.

## 2021-12-24 NOTE — Discharge Instructions (Addendum)
You are seen urgent care today for your right-sided ear infection and sore throat.  I have prescribed cefdinir antibiotic for you to take 2 times daily for the next 10 days. This will cover your ear infection and possible strep throat. Take guaifenesin 2 times daily for the next 5-7 days to thin your mucous to be able to cough it up and blow it out of your nose easier.  Please 1 spray of Flonase into each nostril daily.  Take 40 mg of prednisone once daily with breakfast for 5 days.  If your symptoms or not improving in the next 5 to 7 days, please return to urgent care for further evaluation.  I have given you a work note to return on Monday, May 29.  Please go home and rest and drink plenty of water.  If you develop any new or worsening symptoms or do not improve in the next 2 to 3 days, please return.  If your symptoms are severe, please go to the emergency room.  Follow-up with your primary care provider for further evaluation and management of your symptoms as well as ongoing wellness visits.  I hope you feel better!

## 2022-06-17 ENCOUNTER — Emergency Department (HOSPITAL_COMMUNITY): Payer: Medicaid Other

## 2022-06-17 ENCOUNTER — Encounter (HOSPITAL_COMMUNITY): Payer: Self-pay

## 2022-06-17 ENCOUNTER — Emergency Department (HOSPITAL_COMMUNITY)
Admission: EM | Admit: 2022-06-17 | Discharge: 2022-06-17 | Discharge status: Against Medical Advice | Payer: Medicaid Other | Attending: Emergency Medicine | Admitting: Emergency Medicine

## 2022-06-17 DIAGNOSIS — R55 Syncope and collapse: Secondary | ICD-10-CM | POA: Diagnosis present

## 2022-06-17 DIAGNOSIS — R519 Headache, unspecified: Secondary | ICD-10-CM | POA: Diagnosis not present

## 2022-06-17 DIAGNOSIS — Z5321 Procedure and treatment not carried out due to patient leaving prior to being seen by health care provider: Secondary | ICD-10-CM | POA: Diagnosis not present

## 2022-06-17 LAB — CBC WITH DIFFERENTIAL/PLATELET
Abs Immature Granulocytes: 0.03 10*3/uL (ref 0.00–0.07)
Basophils Absolute: 0 10*3/uL (ref 0.0–0.1)
Basophils Relative: 0 %
Eosinophils Absolute: 0 10*3/uL (ref 0.0–0.5)
Eosinophils Relative: 0 %
HCT: 38.8 % (ref 36.0–46.0)
Hemoglobin: 12.7 g/dL (ref 12.0–15.0)
Immature Granulocytes: 0 %
Lymphocytes Relative: 18 %
Lymphs Abs: 2 10*3/uL (ref 0.7–4.0)
MCH: 31.1 pg (ref 26.0–34.0)
MCHC: 32.7 g/dL (ref 30.0–36.0)
MCV: 94.9 fL (ref 80.0–100.0)
Monocytes Absolute: 0.7 10*3/uL (ref 0.1–1.0)
Monocytes Relative: 7 %
Neutro Abs: 8.3 10*3/uL — ABNORMAL HIGH (ref 1.7–7.7)
Neutrophils Relative %: 75 %
Platelets: 249 10*3/uL (ref 150–400)
RBC: 4.09 MIL/uL (ref 3.87–5.11)
RDW: 12.9 % (ref 11.5–15.5)
WBC: 11.1 10*3/uL — ABNORMAL HIGH (ref 4.0–10.5)
nRBC: 0 % (ref 0.0–0.2)

## 2022-06-17 LAB — TROPONIN I (HIGH SENSITIVITY): Troponin I (High Sensitivity): 10 ng/L (ref <18)

## 2022-06-17 LAB — COMPREHENSIVE METABOLIC PANEL
ALT: 10 U/L (ref 0–44)
AST: 18 U/L (ref 15–41)
Albumin: 4.2 g/dL (ref 3.5–5.0)
Alkaline Phosphatase: 53 U/L (ref 38–126)
Anion gap: 12 (ref 5–15)
BUN: 16 mg/dL (ref 6–20)
CO2: 22 mmol/L (ref 22–32)
Calcium: 9.2 mg/dL (ref 8.9–10.3)
Chloride: 104 mmol/L (ref 98–111)
Creatinine, Ser: 0.97 mg/dL (ref 0.44–1.00)
GFR, Estimated: >60 mL/min (ref >60)
Glucose, Bld: 133 mg/dL — ABNORMAL HIGH (ref 70–99)
Potassium: 3.9 mmol/L (ref 3.5–5.1)
Sodium: 138 mmol/L (ref 135–145)
Total Bilirubin: 0.7 mg/dL (ref 0.3–1.2)
Total Protein: 7.2 g/dL (ref 6.5–8.1)

## 2022-06-17 LAB — URINALYSIS, ROUTINE W REFLEX MICROSCOPIC
Bacteria, UA: NONE SEEN
Bilirubin Urine: NEGATIVE
Glucose, UA: NEGATIVE mg/dL
Ketones, ur: NEGATIVE mg/dL
Nitrite: NEGATIVE
Protein, ur: NEGATIVE mg/dL
Specific Gravity, Urine: 1.015 (ref 1.005–1.030)
pH: 6 (ref 5.0–8.0)

## 2022-06-17 LAB — I-STAT BETA HCG BLOOD, ED (MC, WL, AP ONLY): I-stat hCG, quantitative: <5 m[IU]/mL (ref <5)

## 2022-06-17 MED ORDER — AMLODIPINE BESYLATE 5 MG PO TABS
5.0000 mg | ORAL_TABLET | Freq: Once | ORAL | Status: AC
Start: 2022-06-17 — End: 2022-06-17
  Administered 2022-06-17: 5 mg via ORAL
  Filled 2022-06-17: qty 1

## 2022-06-17 NOTE — ED Notes (Signed)
Pt left due to long wait times. This writer removed IV from right hand.

## 2022-06-17 NOTE — ED Triage Notes (Signed)
Pt reports being at work and feeling abd and nausea and had syncopal episode thereafter. Per EMS pt appeared cross eyed and disoriented on arrival. Pt hypotensive with them SBP 240's. Pt has not taken her BP medication and intermittently takes her thyroid medication.

## 2022-06-17 NOTE — ED Provider Triage Note (Signed)
Emergency Medicine Provider Triage Evaluation Note  [name] , a 36 y.o. female  was evaluated in triage.  Pt complains of fainting earlier today. +LOC. Has not taken blood pressure medicine for the last few months given concern for angioedema w/lisinopril. Reports headache earlier today.  Review of Systems  Positive: headache Negative: Chest pain, sob  Physical Exam  BP (!) 215/125 (BP Location: Right Arm)   Pulse 96   Temp 98.4 F (36.9 C) (Oral)   Resp 18   SpO2 95%  Gen:   Awake, no distress   Resp:  Normal effort  MSK:   Moves extremities without difficulty  Medical Decision Making  Medically screening exam initiated at 4:48 PM.  Appropriate orders placed.  [name] was informed that the remainder of the evaluation will be completed by another provider, this initial triage assessment does not replace that evaluation, and the importance of remaining in the ED until their evaluation is complete.     Osvaldo Shipper, [place] 06/17/22 1654

## 2022-07-23 ENCOUNTER — Ambulatory Visit (HOSPITAL_COMMUNITY)
Admission: EM | Admit: 2022-07-23 | Discharge: 2022-07-23 | Discharge status: Against Medical Advice | Payer: Medicaid Other

## 2022-07-23 ENCOUNTER — Other Ambulatory Visit: Payer: Self-pay

## 2022-07-23 ENCOUNTER — Emergency Department (HOSPITAL_BASED_OUTPATIENT_CLINIC_OR_DEPARTMENT_OTHER): Payer: Medicaid Other

## 2022-07-23 ENCOUNTER — Encounter (HOSPITAL_BASED_OUTPATIENT_CLINIC_OR_DEPARTMENT_OTHER): Payer: Self-pay

## 2022-07-23 ENCOUNTER — Emergency Department (HOSPITAL_BASED_OUTPATIENT_CLINIC_OR_DEPARTMENT_OTHER)
Admission: EM | Admit: 2022-07-23 | Discharge: 2022-07-23 | Discharge status: Against Medical Advice | Payer: Medicaid Other | Attending: Emergency Medicine | Admitting: Emergency Medicine

## 2022-07-23 DIAGNOSIS — Z5321 Procedure and treatment not carried out due to patient leaving prior to being seen by health care provider: Secondary | ICD-10-CM | POA: Diagnosis not present

## 2022-07-23 DIAGNOSIS — M79604 Pain in right leg: Secondary | ICD-10-CM | POA: Diagnosis present

## 2022-07-23 NOTE — ED Triage Notes (Signed)
Patient here POV from Home.  Endorses Pain to Right Leg since 0500 this AM. No Trauma. Sent by UC for DVT Study.   NAD Noted during Triage. A&Ox4. GCS 15. Ambulatory.

## 2022-10-29 ENCOUNTER — Encounter (HOSPITAL_COMMUNITY): Payer: Self-pay

## 2022-10-29 ENCOUNTER — Emergency Department (HOSPITAL_COMMUNITY)
Admission: EM | Admit: 2022-10-29 | Discharge: 2022-10-29 | Discharge status: Against Medical Advice | Payer: Medicaid Other | Attending: Emergency Medicine | Admitting: Emergency Medicine

## 2022-10-29 DIAGNOSIS — H532 Diplopia: Secondary | ICD-10-CM | POA: Insufficient documentation

## 2022-10-29 DIAGNOSIS — Z5321 Procedure and treatment not carried out due to patient leaving prior to being seen by health care provider: Secondary | ICD-10-CM | POA: Diagnosis not present

## 2022-10-29 NOTE — ED Notes (Addendum)
Pt states she has to leave and removed self from monitor.

## 2022-10-29 NOTE — ED Provider Notes (Signed)
Patient left without being seen prior to my evaluation.   Fransico Meadow, MD 10/29/22 [phone]

## 2022-10-29 NOTE — ED Triage Notes (Addendum)
Pt bib EMS for hypertension and double vision. Pt states this morning she developed eye twitching and vision "felt a little weird." At noon she developed sudden onset double vision. Denies lightheaded, headache. Speech is clear with no facial droop. EMS LVO score 0

## 2022-10-31 ENCOUNTER — Ambulatory Visit (HOSPITAL_COMMUNITY)
Admission: EM | Admit: 2022-10-31 | Discharge: 2022-10-31 | Disposition: A | Discharge status: Home / Self Care | Payer: Medicaid Other | Attending: Internal Medicine | Admitting: Internal Medicine

## 2022-10-31 ENCOUNTER — Encounter (HOSPITAL_COMMUNITY): Payer: Self-pay

## 2022-10-31 DIAGNOSIS — Z1152 Encounter for screening for COVID-19: Secondary | ICD-10-CM | POA: Insufficient documentation

## 2022-10-31 DIAGNOSIS — R1110 Vomiting, unspecified: Secondary | ICD-10-CM | POA: Diagnosis present

## 2022-10-31 DIAGNOSIS — R051 Acute cough: Secondary | ICD-10-CM | POA: Insufficient documentation

## 2022-10-31 DIAGNOSIS — R519 Headache, unspecified: Secondary | ICD-10-CM | POA: Diagnosis present

## 2022-10-31 DIAGNOSIS — I10 Essential (primary) hypertension: Secondary | ICD-10-CM | POA: Insufficient documentation

## 2022-10-31 DIAGNOSIS — R059 Cough, unspecified: Secondary | ICD-10-CM | POA: Diagnosis present

## 2022-10-31 DIAGNOSIS — J069 Acute upper respiratory infection, unspecified: Secondary | ICD-10-CM | POA: Diagnosis not present

## 2022-10-31 LAB — SARS CORONAVIRUS 2 (TAT 6-24 HRS): SARS Coronavirus 2: NEGATIVE

## 2022-10-31 MED ORDER — BENZONATATE 100 MG PO CAPS: 100.0000 mg | ORAL_CAPSULE | Freq: Three times a day (TID) | ORAL | 0 refills | Status: AC

## 2022-10-31 MED ORDER — METOCLOPRAMIDE HCL 5 MG/ML IJ SOLN
5.0000 mg | Freq: Once | INTRAMUSCULAR | Status: AC
  Administered 2022-10-31: 5 mg via INTRAMUSCULAR

## 2022-10-31 MED ORDER — DEXAMETHASONE SODIUM PHOSPHATE 10 MG/ML IJ SOLN
INTRAMUSCULAR | Status: AC
  Filled 2022-10-31: qty 1

## 2022-10-31 MED ORDER — ONDANSETRON 4 MG PO TBDP: 4.0000 mg | ORAL_TABLET | Freq: Three times a day (TID) | ORAL | 0 refills | Status: DC | PRN

## 2022-10-31 MED ORDER — KETOROLAC TROMETHAMINE 30 MG/ML IJ SOLN
INTRAMUSCULAR | Status: AC
  Filled 2022-10-31: qty 1

## 2022-10-31 MED ORDER — METOCLOPRAMIDE HCL 5 MG/ML IJ SOLN
INTRAMUSCULAR | Status: AC
  Filled 2022-10-31: qty 2

## 2022-10-31 MED ORDER — DEXAMETHASONE SODIUM PHOSPHATE 10 MG/ML IJ SOLN
10.0000 mg | Freq: Once | INTRAMUSCULAR | Status: AC
  Administered 2022-10-31: 10 mg via INTRAMUSCULAR

## 2022-10-31 MED ORDER — KETOROLAC TROMETHAMINE 30 MG/ML IJ SOLN
30.0000 mg | Freq: Once | INTRAMUSCULAR | Status: AC
  Administered 2022-10-31: 30 mg via INTRAMUSCULAR

## 2022-10-31 NOTE — ED Provider Notes (Signed)
[place]    CSN: [id number] Arrival date & time: 10/31/22  1030      History   Chief Complaint Chief Complaint  Patient presents with  . Cough  . Emesis  . Migraine  . Diarrhea  . Altered Mental Status    HPI [name] is a 37 y.o. female.    Cough Emesis Associated symptoms: cough and diarrhea   Migraine  Diarrhea Associated symptoms: vomiting   Altered Mental Status Associated symptoms: vomiting     Past Medical History:  Diagnosis Date  . Anxiety   . Cancer ([place]) 02/2012   thyroid cancer  . Hypertension   . Preeclampsia in postpartum period 07/08/2018  . Severe Pre-eclampsia superimposed on chronic hypertension, delivered 07/01/2018  . Thyroid disease    cancer    Patient Active Problem List   Diagnosis Date Noted  . Syncope   . 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 Gestational diabetes 01/08/2020  . Abnormal genetic test 01/05/2020  . History of marijuana use 01/05/2020  . History of severe pre-eclampsia 12/28/2019  . Supervision of high risk pregnancy, antepartum 12/15/2019  . Obesity, Class III, BMI 40-49.9 (morbid obesity) ([place]) 12/30/2017  . Obesity affecting pregnancy, antepartum 12/30/2017  . Hypothyroidism during pregnancy, antepartum 12/30/2017  . Chronic hypertension in pregnancy 12/16/2017  . History of cesarean delivery 12/16/2017  . 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  . Hematuria 11/11/2017  . 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 Thyroid cancer ([place]) 09/11/2016    Past Surgical History:  Procedure Laterality Date  . CESAREAN SECTION  2006  . CESAREAN SECTION N/A 07/01/2018   Procedure: CESAREAN SECTION;  Surgeon: Sloan Leiter, MD;  Location: [place];  Service: Obstetrics;  Laterality: N/A;  . DILATION AND EVACUATION N/A 02/07/2015   Procedure: DILATATION AND EVACUATION;  Surgeon: Lavonia Drafts, MD;  Location: [facility];  Service: Gynecology;  Laterality: N/A;   . THYROIDECTOMY  02/2012   total L side first and R side 7 days later.    OB History     Gravida  4   Para  2   Term  2   Preterm      AB  1   Living  2      SAB  1   IAB      Ectopic      Multiple  0   Live Births  2            Home Medications    Prior to Admission medications   Medication Sig Start Date End Date Taking? Authorizing Provider  amLODipine (NORVASC) 2.5 MG tablet Take 2.5 mg by mouth daily.   Yes [provider]  lisinopril (ZESTRIL) 20 MG tablet Take 20 mg by mouth daily.   Yes [provider]  aspirin 81 MG chewable tablet Chew 1 tablet (81 mg total) by mouth daily. 12/15/19   Donnamae Jude, MD  famotidine (PEPCID) 20 MG tablet Take 1 tablet (20 mg total) by mouth daily. 01/05/20   Sloan Leiter, MD  ferrous sulfate (FERROUSUL) 325 (65 FE) MG tablet Take 1 tablet (325 mg total) by mouth 2 (two) times daily. 12/29/19   Jorje Guild, NP  fluticasone [facility]) 50 MCG/ACT nasal spray Place 1 spray into both nostrils daily. 12/24/21   Talbot Grumbling, FNP  Guaifenesin 1200 MG TB12 Take 1 tablet (1,200 mg total) by mouth in the morning and at bedtime. 12/24/21  Talbot Grumbling, FNP  labetalol (NORMODYNE) 200 MG tablet Take 2 tablets (400 mg total) by mouth 2 (two) times daily. 12/15/19   Donnamae Jude, MD  levothyroxine (SYNTHROID) 200 MCG tablet Take 400 mcg by mouth daily. 11/20/19   [provider]  ondansetron (ZOFRAN ODT) 4 MG disintegrating tablet Take 1 tablet (4 mg total) by mouth every 8 (eight) hours as needed for nausea or vomiting. 01/05/20   Sloan Leiter, MD    Family History Family History  Problem Relation Age of Onset  . Hypertension Mother   . Blindness Mother        one eye    Social History Social History   Tobacco Use  . Smoking status: Never  . Smokeless tobacco: Never  Vaping Use  . Vaping Use: Never used  Substance Use Topics  . Alcohol use: No  . Drug use: Not Currently     Types: Marijuana    Comment: last used 05/19/18     Allergies   Patient has no known allergies.   Review of Systems Review of Systems  Respiratory:  Positive for cough.   Gastrointestinal:  Positive for diarrhea and vomiting.     Physical Exam Triage Vital Signs ED Triage Vitals  Enc Vitals Group     BP 10/31/22 1053 (!) 168/110     Pulse Rate 10/31/22 1053 98     Resp 10/31/22 1053 16     Temp 10/31/22 1053 98 F (36.7 C)     Temp Source 10/31/22 1053 Oral     SpO2 10/31/22 1053 98 %     Weight --      Height --      Head Circumference --      Peak Flow --      Pain Score 10/31/22 1057 10     Pain Loc --      Pain Edu? --      Excl. in [place]? --    No data found.  Updated Vital Signs BP (!) 168/110 (BP Location: Right Arm)   Pulse 98   Temp 98 F (36.7 C) (Oral)   Resp 16   LMP 10/10/2022 (Approximate)   SpO2 98%   Visual Acuity Right Eye Distance:   Left Eye Distance:   Bilateral Distance:    Right Eye Near:   Left Eye Near:    Bilateral Near:     Physical Exam   UC Treatments / Results  Labs (all labs ordered are listed, but only abnormal results are displayed) Labs Reviewed  SARS CORONAVIRUS 2 (TAT 6-24 HRS)    EKG   Radiology No results found.  Procedures Procedures (including critical care time)  Medications Ordered in UC Medications  ketorolac (TORADOL) 30 MG/ML injection 30 mg (has no administration in time range)  metoCLOPramide (REGLAN) injection 5 mg (has no administration in time range)  dexamethasone (DECADRON) injection 10 mg (has no administration in time range)    Initial Impression / Assessment and Plan / UC Course  I have reviewed the triage vital signs and the nursing notes.  Pertinent labs & imaging results that were available during my care of the patient were reviewed by me and considered in my medical decision making (see chart for details).     *** Final Clinical Impressions(s) / UC Diagnoses   Final  diagnoses:  None   Discharge Instructions   None    ED Prescriptions   None    PDMP not reviewed  this encounter.

## 2022-10-31 NOTE — ED Triage Notes (Signed)
Patient c/o migraine, cough, N/v/d after having a LOC episode on 10/29/22. Patient was seen at [facility] for LOC.  Patient states she has been taking Ibuprofen and Tylenol for her headache and states, "I don't know which one, but since taking the medicine I have had N/v/ and diarrhea.

## 2022-10-31 NOTE — Discharge Instructions (Addendum)
You have a viral upper respiratory infection.  COVID-19 testing is pending. We will call you with results if positive. Wear a mask until your COVID-19 testing comes back.  If your COVID test is positive, you need to wear a mask for 5 days and stay at home until you do not have a fever for 24 hours.  I gave you some medicines in the clinic today to help with your headache.  Do not take any ibuprofen until tomorrow since I gave you these medicines in the clinic. If your headache gets worse or if you develop any new symptoms such as one-sided limb weakness, dizziness, chest pain, shortness of breath, double vision, or pass out, you need to go to the emergency room.  You may take Tessalon Perles every 8 hours as needed for coughing. You may purchase Mucinex over-the-counter and take this as needed for nasal congestion. Zofran 4mg  under the tongue every 8 hours as needed for nausea/vomiting.   1 tablespoon of honey in warm water and/or salt water gargles may also help with symptoms. Humidifier to your room will help add water to the air and reduce coughing.  If you develop any new or worsening symptoms, please return.  If your symptoms are severe, please go to the emergency room.  Follow-up with your primary care provider for further evaluation and management of your symptoms as well as ongoing wellness visits.  I hope you feel better!

## 2022-11-03 ENCOUNTER — Ambulatory Visit (HOSPITAL_COMMUNITY)
Admission: EM | Admit: 2022-11-03 | Discharge: 2022-11-03 | Disposition: A | Discharge status: Home / Self Care | Payer: Medicaid Other | Attending: Family Medicine | Admitting: Family Medicine

## 2022-11-03 ENCOUNTER — Encounter (HOSPITAL_COMMUNITY): Payer: Self-pay

## 2022-11-03 DIAGNOSIS — R1012 Left upper quadrant pain: Secondary | ICD-10-CM

## 2022-11-03 NOTE — ED Triage Notes (Signed)
Pt had surgery 6 months ago and had a foreign object removed , pt stated doctors did not know what it was . Pt states she having the same pain again since today.

## 2022-11-03 NOTE — ED Provider Notes (Signed)
[name]    CSN: [id number] Arrival date & time: 11/03/22  1811      History   Chief Complaint Chief Complaint  Patient presents with   Foreign Body    HPI [name] is a 37 y.o. female.    Foreign Body  Here for left-sided abdominal pain.  It began yesterday.  She reports fever and congestion.  She states she is nauseated.  No diarrhea.  She states it is very tender on her left mid abdomen and she is concerned that it is a surgical condition like she had previously.  On review of the chart it looks like she had an abdominal wall abscess removed some years ago.  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    Gestational diabetes 01/08/2020   Abnormal genetic test 01/05/2020   History of marijuana use 01/05/2020   History of severe pre-eclampsia 12/28/2019   Supervision of high risk pregnancy, antepartum 12/15/2019   Obesity, Class III, BMI 40-49.9 (morbid obesity) 12/30/2017   Obesity affecting pregnancy, antepartum 12/30/2017   Hypothyroidism during pregnancy, antepartum 12/30/2017   Chronic hypertension in pregnancy 12/16/2017   History of cesarean delivery 12/16/2017   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   Hematuria 11/11/2017   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    OB History     Gravida  4   Para  2   Term  2   Preterm      AB  1   Living  2      SAB  1   IAB      Ectopic      Multiple  0   Live Births  2            Home Medications    Prior to Admission medications   Medication Sig Start Date End Date Taking? Authorizing Provider  amLODipine (NORVASC) 2.5 MG tablet Take 2.5 mg by mouth daily.   Yes [provider]  aspirin 81 MG chewable tablet Chew 1 tablet (81 mg total) by mouth daily. 12/15/19  Yes Donnamae Jude, MD  levothyroxine (SYNTHROID) 200 MCG tablet Take 400 mcg by mouth daily. 11/20/19  Yes [provider]  lisinopril (ZESTRIL) 20 MG tablet Take 20 mg by mouth daily.   Yes [provider]  benzonatate (TESSALON) 100 MG capsule Take 1 capsule (100 mg total) by mouth every 8 (eight) hours. 10/31/22   Talbot Grumbling, FNP  famotidine (PEPCID) 20 MG tablet Take 1 tablet (20 mg total) by mouth daily. 01/05/20   Rosana Hoes,  Craig Guess, MD  ferrous sulfate (FERROUSUL) 325 (65 FE) MG tablet Take 1 tablet (325 mg total) by mouth 2 (two) times daily. 12/29/19   Jorje Guild, NP  fluticasone [facility]) 50 MCG/ACT nasal spray Place 1 spray into both nostrils daily. 12/24/21   Talbot Grumbling, FNP  Guaifenesin 1200 MG TB12 Take 1 tablet (1,200 mg total) by mouth in the morning and at bedtime. 12/24/21   Talbot Grumbling, FNP  labetalol (NORMODYNE) 200 MG tablet Take 2 tablets (400 mg total) by mouth 2 (two) times daily. 12/15/19   Donnamae Jude, MD  ondansetron (ZOFRAN-ODT) 4 MG disintegrating tablet Take 1 tablet (4 mg total) by mouth every 8 (eight) hours as needed for nausea or vomiting. 10/31/22   Talbot Grumbling, FNP    Family History Family History  Problem Relation Age of Onset   Hypertension Mother     Blindness Mother        one eye    Social History Social History   Tobacco Use   Smoking status: Never   Smokeless tobacco: Never  Vaping Use   Vaping Use: Never used  Substance Use Topics   Alcohol use: No   Drug use: Not Currently    Types: Marijuana    Comment: last used 05/19/18     Allergies   Patient has no known allergies.   Review of Systems Review of Systems   Physical Exam Triage Vital Signs ED Triage Vitals  Enc Vitals Group     BP 11/03/22 1930 (!) 162/116     Pulse Rate 11/03/22 1930 87     Resp 11/03/22 1930 12     Temp 11/03/22 1930 98 F (36.7 C)     Temp Source 11/03/22 1930 Oral     SpO2 11/03/22 1930 98 %     Weight --      Height --      Head Circumference --      Peak Flow --      Pain Score 11/03/22 1923 8     Pain Loc --      Pain Edu? --      Excl. in [place]? --    No data found.  Updated Vital Signs BP (!) 162/116 (BP Location: Right Arm)   Pulse 87   Temp 98 F (36.7 C) (Oral)   Resp 12   LMP 10/10/2022 (Approximate)   SpO2 98%   Visual Acuity Right Eye Distance:   Left Eye Distance:   Bilateral Distance:    Right Eye Near:   Left Eye Near:    Bilateral Near:     Physical Exam Constitutional:      General: She is not in acute distress.    Appearance: She is not ill-appearing, toxic-appearing or diaphoretic.  HENT:     Mouth/Throat:     Mouth: Mucous membranes are moist.  Eyes:     Extraocular Movements: Extraocular movements intact.     Conjunctiva/sclera: Conjunctivae normal.     Pupils: Pupils are equal, round, and reactive to light.  Cardiovascular:     Rate and Rhythm: Normal rate and regular rhythm.     Heart sounds: No murmur heard. Pulmonary:     Effort: Pulmonary effort is normal.     Breath sounds: Normal breath sounds.  Abdominal:     Tenderness: There is abdominal tenderness (She is very tender in her left upper quadrant and left periumbilical area.  There is no distention that I can  tell.  I can  see her a scarring on her suprapubic area from the abdominal wall surgery.  There is no erythema or induration of the skin.).  Musculoskeletal:     Cervical back: Neck supple.  Lymphadenopathy:     Cervical: No cervical adenopathy.  Skin:    Coloration: Skin is not jaundiced or pale.  Neurological:     General: No focal deficit present.     Mental Status: She is alert and oriented to person, place, and time.  Psychiatric:        Behavior: Behavior normal.      UC Treatments / Results  Labs (all labs ordered are listed, but only abnormal results are displayed) Labs Reviewed - No data to display  EKG   Radiology No results found.  Procedures Procedures (including critical care time)  Medications Ordered in UC Medications - No data to display  Initial Impression / Assessment and Plan / UC Course  I have reviewed the triage vital signs and the nursing notes.  Pertinent labs & imaging results that were available during my care of the patient were reviewed by me and considered in my medical decision making (see chart for details).        With the patient's concern that this is something surgical and her being very tender, I recommended that she go to the emergency room.  She was agreeable when I discussed this with her initially in the room.  While her son was waiting on his flu test, she started demanding something for pain.  When her son was supposed to get swabbed for COVID, she explained that she could not go to the emergency room because of her kids, and left.  Staff explained to her that she was supposed to go to the emergency room for further evaluation and that evaluation for pain treatment would also occur there. Final Clinical Impressions(s) / UC Diagnoses   Final diagnoses:  Left upper quadrant abdominal pain     Discharge Instructions      Please proceed to the ER for further evaluation   ED Prescriptions   None    PDMP not reviewed this encounter.    [place] Henle, MD 11/03/22 2039

## 2022-11-03 NOTE — ED Notes (Signed)
Patient is being discharged from the Urgent Care and sent to the Emergency Department via POV . Per  Dr Windy Carina, patient is in need of higher level of care due to abdominal pain, limited resources. Patient is aware and verbalizes understanding of plan of care.  Vitals:   11/03/22 1930  BP: (!) 162/116  Pulse: 87  Resp: 12  Temp: 98 F (36.7 C)  SpO2: 98%

## 2022-11-03 NOTE — Discharge Instructions (Addendum)
Please proceed to the ER for further evaluation.  

## 2022-11-03 NOTE — ED Notes (Signed)
Patient left the building demanding pain medicine, saying she couldn't go to the ed with her kids with her.    Patient left the building.  Notified dr Windy Carina

## 2022-12-24 ENCOUNTER — Encounter: Payer: Self-pay | Admitting: Plastic Surgery

## 2022-12-24 ENCOUNTER — Ambulatory Visit (INDEPENDENT_AMBULATORY_CARE_PROVIDER_SITE_OTHER): Payer: Medicaid Other | Admitting: Plastic Surgery

## 2022-12-24 VITALS — BP 179/130 | HR 90 | Ht 62.0 in | Wt 281.4 lb

## 2022-12-24 DIAGNOSIS — M793 Panniculitis, unspecified: Secondary | ICD-10-CM

## 2022-12-24 DIAGNOSIS — R21 Rash and other nonspecific skin eruption: Secondary | ICD-10-CM | POA: Diagnosis not present

## 2022-12-24 DIAGNOSIS — M549 Dorsalgia, unspecified: Secondary | ICD-10-CM | POA: Diagnosis not present

## 2022-12-24 NOTE — Addendum Note (Signed)
Addended by: Verdie Shire on: 12/24/2022 05:02 PM   Modules accepted: Orders

## 2022-12-24 NOTE — Progress Notes (Addendum)
Patient ID: [name], female    DOB: 1986/03/28, 37 y.o.   MRN: [id number]   Chief Complaint  Patient presents with   Consult   Skin Problem    The patient is a lovely 37 year old female here for evaluation of her abdomen.  She is 5 feet 2 inches tall and weighs 281 pounds.  She has decreased her weight from 300 pounds by using phentermine and trying to increase her activity.  She does have diabetes and hypertension.  She is also been treated for thyroid disease.  She is not a smoker.  She has had 3 C-sections and has 3 kids at home.  She also had a thyroidectomy.  She complains of back pain.  She has frequent rashes and skin breakdown.  It is noted today.  She has tried everything she knows of over-the-counter with creams and powders to try to keep the skin from breaking down.  Her muscles feel intact and no sign of diastases that I can tell from her exam which is admittedly difficult.  She is interested in a ectomy.    Review of Systems  Constitutional:  Positive for activity change. Negative for appetite change.  HENT: Negative.    Eyes: Negative.   Respiratory: Negative.    Cardiovascular: Negative.   Gastrointestinal: Negative.   Endocrine: Negative.   Musculoskeletal:  Positive for back pain and neck pain.  Skin:  Positive for rash.    Past Medical History:  Diagnosis Date   Anxiety    Cancer (HCC) 02/2012   thyroid cancer   Hypertension    Preeclampsia in postpartum period 07/08/2018   Severe Pre-eclampsia superimposed on chronic hypertension, delivered 07/01/2018   Thyroid disease    cancer    Past Surgical History:  Procedure Laterality Date   CESAREAN SECTION  2006   CESAREAN SECTION N/A 07/01/2018   Procedure: CESAREAN SECTION;  Surgeon: Conan Bowens, MD;  Location: [facility];  Service: Obstetrics;  Laterality: N/A;   DILATION AND EVACUATION N/A 02/07/2015   Procedure: DILATATION AND EVACUATION;  Surgeon: Willodean Rosenthal, MD;   Location: [facility];  Service: Gynecology;  Laterality: N/A;   THYROIDECTOMY  02/2012   total L side first and R side 7 days later.      Current Outpatient Medications:    amLODipine (NORVASC) 2.5 MG tablet, Take 2.5 mg by mouth daily., Disp: , Rfl:    aspirin 81 MG chewable tablet, Chew 1 tablet (81 mg total) by mouth daily., Disp: 90 tablet, Rfl: 2   benzonatate (TESSALON) 100 MG capsule, Take 1 capsule (100 mg total) by mouth every 8 (eight) hours., Disp: 21 capsule, Rfl: 0   famotidine (PEPCID) 20 MG tablet, Take 1 tablet (20 mg total) by mouth daily., Disp: 60 tablet, Rfl: 1   ferrous sulfate (FERROUSUL) 325 (65 FE) MG tablet, Take 1 tablet (325 mg total) by mouth 2 (two) times daily., Disp: 60 tablet, Rfl: 1   fluticasone (FLONASE) 50 MCG/ACT nasal spray, Place 1 spray into both nostrils daily., Disp: 16 g, Rfl: 2   Guaifenesin 1200 MG TB12, Take 1 tablet (1,200 mg total) by mouth in the morning and at bedtime., Disp: 14 tablet, Rfl: 0   labetalol (NORMODYNE) 200 MG tablet, Take 2 tablets (400 mg total) by mouth 2 (two) times daily., Disp: 120 tablet, Rfl: 1   levothyroxine (SYNTHROID) 200 MCG tablet, Take 400 mcg by mouth daily., Disp: , Rfl:    lisinopril (ZESTRIL) 20  MG tablet, Take 20 mg by mouth daily., Disp: , Rfl:    ondansetron (ZOFRAN-ODT) 4 MG disintegrating tablet, Take 1 tablet (4 mg total) by mouth every 8 (eight) hours as needed for nausea or vomiting., Disp: 20 tablet, Rfl: 0   Objective:   There were no vitals filed for this visit.  Physical Exam Vitals and nursing note reviewed.  Constitutional:      Appearance: Normal appearance.  HENT:     Head: Normocephalic and atraumatic.  Cardiovascular:     Rate and Rhythm: Normal rate.     Pulses: Normal pulses.  Pulmonary:     Effort: Pulmonary effort is normal.  Abdominal:     General: There is no distension.     Palpations: Abdomen is soft. There is no mass.     Tenderness: There is no abdominal tenderness.      Hernia: No hernia is present.  Musculoskeletal:        General: No swelling or deformity.  Skin:    General: Skin is warm.     Capillary Refill: Capillary refill takes less than 2 seconds.     Coloration: Skin is not jaundiced.     Findings: No bruising.  Neurological:     Mental Status: She is alert and oriented to person, place, and time.  Psychiatric:        Mood and Affect: Mood normal.        Behavior: Behavior normal.        Thought Content: Thought content normal.        Judgment: Judgment normal.     Assessment & Plan:  Panniculitis  I think the patient is a very good candidate for evaluation with a healthy weight and wellness center.  I also would like to send her to Dr. Doylene Canard for evaluation for gastric surgery.  I talked to her about cutting back on her carbs in her sugars.  I would like to see her back in 6 months and we will reevaluate.  I think she is got a really good chance of getting this covered by insurance if we can get her ready and at a more ideal weight.  Also encouraged her to contact her PCP due to her elevated blood pressure.  Pictures were obtained of the patient and placed in the chart with the patient's or guardian's permission.   Alena Bills Jonet Mathies, DO

## 2022-12-30 ENCOUNTER — Encounter (INDEPENDENT_AMBULATORY_CARE_PROVIDER_SITE_OTHER): Payer: Medicaid Other | Admitting: Family Medicine

## 2023-02-25 ENCOUNTER — Ambulatory Visit (INDEPENDENT_AMBULATORY_CARE_PROVIDER_SITE_OTHER): Payer: Medicaid Other | Admitting: Student

## 2023-02-25 ENCOUNTER — Ambulatory Visit: Payer: Medicaid Other | Admitting: Plastic Surgery

## 2023-02-25 ENCOUNTER — Encounter: Payer: Self-pay | Admitting: Student

## 2023-02-25 DIAGNOSIS — R21 Rash and other nonspecific skin eruption: Secondary | ICD-10-CM

## 2023-02-25 DIAGNOSIS — M793 Panniculitis, unspecified: Secondary | ICD-10-CM | POA: Diagnosis not present

## 2023-02-25 NOTE — Progress Notes (Signed)
Referring Provider Center, [facility] [address] [place],  [place] [address]   CC:  Chief Complaint  Patient presents with   Follow-up      [name] is an 37 y.o. female.  HPI: Patient is a 37 year old female with history of panniculitis.  She was initially seen by Dr. Ulice Bold on 12/24/2022 for evaluation of her abdomen.  At the time of the exam, patient was 5 feet 2 inches tall.  She weighed 281 pounds.  Patient reported she had decreased her weight from 300 pounds by using phentermine and trying to increase her activities.  Patient reports she had diabetes and hypertension.  Patient also stated she had a thyroidectomy.  She complained of back pain and frequent rashes and skin breakdown.  Patient stated she was interested in a panniculectomy.  It was recommended to the patient that she go to the healthy weight and wellness center for evaluation.  Patient was also recommended for evaluation for gastric surgery.  She discussed cutting back on her carbs and her sugars.  Patient was to return in 6 months.  Patient reports she did find a general surgery clinic that would accept her insurance, but has not had an appointment yet with them.  She states that she needs a referral.  Patient also states that she has not been to healthy weight and wellness.  She states that she is interested in referrals to both general surgery and healthy weight and wellness to help her to lose weight before undergoing panniculectomy.  Patient states she is still experiencing rashes underneath her pannus.  She denies any recent changes in her health.  Patient also states that her PCP had placed her on lisinopril, but it was causing her to experience some swelling.  She also states she saw her PCP recently, but her PCP just retired after her previous visit.  Patient's blood pressure is elevated today.  Review of Systems General: Denies any recent changes in her health  Physical Exam     02/25/2023    3:03 PM 12/24/2022    5:07 PM 12/24/2022    5:04 PM  Vitals with BMI  Systolic 185 179 604  Diastolic 123 130 540  Pulse 75  90    General:  No acute distress,  Alert and oriented, Non-Toxic, Normal speech and affect Psych: Normal behavior and mood Respiratory: No increased work of breathing MSK: Ambulatory  Assessment/Plan  Panniculitis - Plan: Ambulatory referral to General Surgery, Amb Ref to Medical Weight Management   Discussed with the patient that I will place referrals to general surgery and a healthy weight and wellness.  I recommended she explore these options first before moving forward with panniculectomy to see if these will aid in her weight loss to have the most ideal outcome.  Patient expressed understanding and was in agreement with this.  I recommended the patient follow back up with a primary care provider at the same office her initial primary care provider was at given her elevated blood pressure.  Patient states that she will stop at their office on their way home as they have a walk-in option.  We will plan to do a telephone visit in 3 months to see where the patient is at with general surgery and healthy weight and wellness.  I discussed with the patient that she may call us if she has any questions or concerns in the meantime.  Laurena Spies 02/25/2023, 3:14 PM

## 2023-03-02 ENCOUNTER — Telehealth: Payer: Self-pay | Admitting: Student

## 2023-03-02 NOTE — Telephone Encounter (Signed)
Called the patient and told her that I was contacted by one of the surgeons at [facility] in [place] and was notified that they do not perform gastric procedures/weight loss procedures at that clinic.  I recommended to the patient that she call her insurance company and asked specifically if they have any other options for surgeons that would be covered for gastric procedures or weight loss procedures.  I told her I am more than happy to put in the referral and once we get of name of where she can go.  Patient expressed understanding and states that she will contact us when she finds out from her insurance.

## 2023-03-18 ENCOUNTER — Emergency Department (HOSPITAL_COMMUNITY)
Admission: EM | Admit: 2023-03-18 | Discharge: 2023-03-19 | Disposition: A | Discharge status: Home / Self Care | Payer: Medicaid Other | Attending: Emergency Medicine | Admitting: Emergency Medicine

## 2023-03-18 ENCOUNTER — Other Ambulatory Visit: Payer: Self-pay

## 2023-03-18 ENCOUNTER — Encounter (HOSPITAL_COMMUNITY): Payer: Self-pay

## 2023-03-18 DIAGNOSIS — K0889 Other specified disorders of teeth and supporting structures: Secondary | ICD-10-CM

## 2023-03-18 DIAGNOSIS — K047 Periapical abscess without sinus: Secondary | ICD-10-CM | POA: Insufficient documentation

## 2023-03-18 DIAGNOSIS — I10 Essential (primary) hypertension: Secondary | ICD-10-CM | POA: Insufficient documentation

## 2023-03-18 DIAGNOSIS — K029 Dental caries, unspecified: Secondary | ICD-10-CM | POA: Insufficient documentation

## 2023-03-18 DIAGNOSIS — Z85850 Personal history of malignant neoplasm of thyroid: Secondary | ICD-10-CM | POA: Diagnosis not present

## 2023-03-18 DIAGNOSIS — Z7982 Long term (current) use of aspirin: Secondary | ICD-10-CM | POA: Diagnosis not present

## 2023-03-18 DIAGNOSIS — Z79899 Other long term (current) drug therapy: Secondary | ICD-10-CM | POA: Diagnosis not present

## 2023-03-18 NOTE — ED Triage Notes (Signed)
Pt states that her left upper tooth has been hurting for 2 days. Pt states that it has been broken for a couple of years.

## 2023-03-19 MED ORDER — PENICILLIN V POTASSIUM 500 MG PO TABS: 500.0000 mg | ORAL_TABLET | Freq: Four times a day (QID) | ORAL | 0 refills | Status: AC

## 2023-03-19 MED ORDER — KETOROLAC TROMETHAMINE 30 MG/ML IJ SOLN
30.0000 mg | Freq: Once | INTRAMUSCULAR | Status: AC
  Administered 2023-03-19: 30 mg via INTRAMUSCULAR
  Filled 2023-03-19: qty 1

## 2023-03-19 MED ORDER — NAPROXEN 500 MG PO TABS: 500.0000 mg | ORAL_TABLET | Freq: Two times a day (BID) | ORAL | 0 refills | Status: DC

## 2023-03-19 MED ORDER — PENICILLIN V POTASSIUM 500 MG PO TABS
500.0000 mg | ORAL_TABLET | Freq: Once | ORAL | Status: AC
  Administered 2023-03-19: 500 mg via ORAL
  Filled 2023-03-19: qty 1

## 2023-03-19 NOTE — Discharge Instructions (Addendum)
You were seen today for dental pain.  Take antibiotics as prescribed.  Follow-up with your dentist.

## 2023-03-19 NOTE — ED Provider Notes (Signed)
[place] EMERGENCY DEPARTMENT AT [facility] Provider Note   CSN: [id number] Arrival date & time: 03/18/23  2316     History  Chief Complaint  Patient presents with   Dental Pain    [name] is a 37 y.o. female.  HPI     This is a 37 year old female who presents with dental pain.  Patient reports pain in the left upper lateral incisor, #10.  Pain over the last day.  Reports pain radiates into her naris.  No difficulty swallowing.  No fevers.  She called for dental appointment but cannot be seen until September.  She has tried multiple over-the-counter remedies including Orajel, cloves, Goody powder with minimal relief.  Home Medications Prior to Admission medications   Medication Sig Start Date End Date Taking? Authorizing Provider  naproxen (NAPROSYN) 500 MG tablet Take 1 tablet (500 mg total) by mouth 2 (two) times daily. 03/19/23  Yes Kyuss Hale, Mayer Masker, MD  penicillin v potassium (VEETID) 500 MG tablet Take 1 tablet (500 mg total) by mouth 4 (four) times daily for 10 days. 03/19/23 03/29/23 Yes Iesha Summerhill, Mayer Masker, MD  amLODipine (NORVASC) 2.5 MG tablet Take 2.5 mg by mouth daily.    [provider]  aspirin 81 MG chewable tablet Chew 1 tablet (81 mg total) by mouth daily. 12/15/19   Reva Bores, MD  benzonatate (TESSALON) 100 MG capsule Take 1 capsule (100 mg total) by mouth every 8 (eight) hours. Patient not taking: Reported on 02/25/2023 10/31/22   Carlisle Beers, FNP  famotidine (PEPCID) 20 MG tablet Take 1 tablet (20 mg total) by mouth daily. Patient not taking: Reported on 02/25/2023 01/05/20   Conan Bowens, MD  ferrous sulfate (FERROUSUL) 325 (65 FE) MG tablet Take 1 tablet (325 mg total) by mouth 2 (two) times daily. Patient not taking: Reported on 02/25/2023 12/29/19   Judeth Horn, NP  fluticasone [facility]) 50 MCG/ACT nasal spray Place 1 spray into both nostrils daily. Patient not taking: Reported on 02/25/2023 12/24/21   Carlisle Beers, FNP  Guaifenesin 1200 MG TB12 Take 1 tablet (1,200 mg total) by mouth in the morning and at bedtime. Patient not taking: Reported on 02/25/2023 12/24/21   Carlisle Beers, FNP  labetalol (NORMODYNE) 200 MG tablet Take 2 tablets (400 mg total) by mouth 2 (two) times daily. Patient not taking: Reported on 02/25/2023 12/15/19   Reva Bores, MD  levothyroxine (SYNTHROID) 200 MCG tablet Take 400 mcg by mouth daily. 11/20/19   [provider]  lisinopril (ZESTRIL) 20 MG tablet Take 20 mg by mouth daily. Patient not taking: Reported on 02/25/2023    [provider]  ondansetron (ZOFRAN-ODT) 4 MG disintegrating tablet Take 1 tablet (4 mg total) by mouth every 8 (eight) hours as needed for nausea or vomiting. 10/31/22   Carlisle Beers, FNP      Allergies    Patient has no known allergies.    Review of Systems   Review of Systems  Constitutional:  Negative for fever.  HENT:  Positive for dental problem. Negative for trouble swallowing.   All other systems reviewed and are negative.   Physical Exam Updated Vital Signs BP (!) 185/111   Pulse 80   Temp 98.3 F (36.8 C) (Oral)   Resp 17   Ht 1.575 m (5\' 2" )   Wt 124.6 kg   SpO2 100%   BMI 50.24 kg/m  Physical Exam Vitals and nursing note reviewed.  Constitutional:  Appearance: She is well-developed. She is obese. She is not ill-appearing.  HENT:     Head: Normocephalic and atraumatic.     Mouth/Throat:     Mouth: Mucous membranes are moist.     Comments: Neuro reported tension, dental caries noted with obvious erosion tooth #10, no adjacent or.  Focal abscess palpated, there is tenderness to the periapical region. Eyes:     Pupils: Pupils are equal, round, and reactive to light.  Cardiovascular:     Rate and Rhythm: Normal rate and regular rhythm.  Pulmonary:     Effort: Pulmonary effort is normal. No respiratory distress.  Abdominal:     Palpations: Abdomen is soft.  Musculoskeletal:      Cervical back: Neck supple.  Skin:    General: Skin is warm and dry.  Neurological:     Mental Status: She is alert and oriented to person, place, and time.  Psychiatric:        Mood and Affect: Mood normal.     ED Results / Procedures / Treatments   Labs (all labs ordered are listed, but only abnormal results are displayed) Labs Reviewed - No data to display  EKG None  Radiology No results found.  Procedures Procedures    Medications Ordered in ED Medications  penicillin v potassium (VEETID) tablet 500 mg (has no administration in time range)  ketorolac (TORADOL) 30 MG/ML injection 30 mg (has no administration in time range)    ED Course/ Medical Decision Making/ A&P                                 Medical Decision Making Risk Prescription drug management.   This patient presents to the ED for concern of dental pain, this involves an extensive number of treatment options, and is a complaint that carries with it a high risk of complications and morbidity.  I considered the following differential and admission for this acute, potentially life threatening condition.  The differential diagnosis includes underlying infection, abscess, dental caries  MDM:    This is a 37 year old female who presents with dental pain.  She is overall nontoxic and vital signs are reassuring.  No evidence of deep space infection.  No drainable abscess.  Suspect occult infection.  Will start on penicillin and scheduled NSAIDs.  She has dental follow-up in 1 month.  (Labs, imaging, consults)  Labs: I Ordered, and personally interpreted labs.  The pertinent results include: None  Imaging Studies ordered: I ordered imaging studies including none I independently visualized and interpreted imaging. I agree with the radiologist interpretation  Additional history obtained from chart review.  External records from outside source obtained and reviewed including prior evaluations  Cardiac  Monitoring: The patient was not maintained on a cardiac monitor.  If on the cardiac monitor, I personally viewed and interpreted the cardiac monitored which showed an underlying rhythm of: N/A  Reevaluation: After the interventions noted above, I reevaluated the patient and found that they have :stayed the same  Social Determinants of Health:  lives independently  Disposition: Discharge  Co morbidities that complicate the patient evaluation  Past Medical History:  Diagnosis Date   Anxiety    Cancer (HCC) 02/2012   thyroid cancer   Hypertension    Preeclampsia in postpartum period 07/08/2018   Severe Pre-eclampsia superimposed on chronic hypertension, delivered 07/01/2018   Thyroid disease    cancer     Medicines Meds  ordered this encounter  Medications   penicillin v potassium (VEETID) tablet 500 mg   ketorolac (TORADOL) 30 MG/ML injection 30 mg   penicillin v potassium (VEETID) 500 MG tablet    Sig: Take 1 tablet (500 mg total) by mouth 4 (four) times daily for 10 days.    Dispense:  40 tablet    Refill:  0   naproxen (NAPROSYN) 500 MG tablet    Sig: Take 1 tablet (500 mg total) by mouth 2 (two) times daily.    Dispense:  30 tablet    Refill:  0    I have reviewed the patients home medicines and have made adjustments as needed  Problem List / ED Course: Problem List Items Addressed This Visit   None Visit Diagnoses     Pain, dental    -  Primary                   Final Clinical Impression(s) / ED Diagnoses Final diagnoses:  Pain, dental    Rx / DC Orders ED Discharge Orders          Ordered    penicillin v potassium (VEETID) 500 MG tablet  4 times daily        03/19/23 0128    naproxen (NAPROSYN) 500 MG tablet  2 times daily        03/19/23 0128              Shon Baton, MD 03/19/23 0131

## 2023-04-02 ENCOUNTER — Ambulatory Visit (HOSPITAL_COMMUNITY)
Admission: EM | Admit: 2023-04-02 | Discharge: 2023-04-02 | Disposition: A | Discharge status: Home / Self Care | Payer: Medicaid Other | Attending: Family Medicine | Admitting: Family Medicine

## 2023-04-02 ENCOUNTER — Encounter (HOSPITAL_COMMUNITY): Payer: Self-pay

## 2023-04-02 ENCOUNTER — Ambulatory Visit (INDEPENDENT_AMBULATORY_CARE_PROVIDER_SITE_OTHER): Payer: Medicaid Other

## 2023-04-02 DIAGNOSIS — M79644 Pain in right finger(s): Secondary | ICD-10-CM

## 2023-04-02 DIAGNOSIS — M79602 Pain in left arm: Secondary | ICD-10-CM

## 2023-04-02 DIAGNOSIS — I10 Essential (primary) hypertension: Secondary | ICD-10-CM | POA: Diagnosis not present

## 2023-04-02 MED ORDER — IBUPROFEN 800 MG PO TABS: 800.0000 mg | ORAL_TABLET | Freq: Three times a day (TID) | ORAL | 0 refills | Status: DC
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
## 2023-05-25 ENCOUNTER — Emergency Department (HOSPITAL_COMMUNITY): Payer: No Typology Code available for payment source

## 2023-05-25 ENCOUNTER — Emergency Department (HOSPITAL_COMMUNITY)
Admission: EM | Admit: 2023-05-25 | Discharge: 2023-05-25 | Disposition: A | Discharge status: Home / Self Care | Payer: No Typology Code available for payment source | Attending: Emergency Medicine | Admitting: Emergency Medicine

## 2023-05-25 ENCOUNTER — Encounter (HOSPITAL_COMMUNITY): Payer: Self-pay

## 2023-05-25 ENCOUNTER — Other Ambulatory Visit: Payer: Self-pay

## 2023-05-25 DIAGNOSIS — E039 Hypothyroidism, unspecified: Secondary | ICD-10-CM | POA: Insufficient documentation

## 2023-05-25 DIAGNOSIS — S3992XA Unspecified injury of lower back, initial encounter: Secondary | ICD-10-CM | POA: Diagnosis present

## 2023-05-25 DIAGNOSIS — S39012A Strain of muscle, fascia and tendon of lower back, initial encounter: Secondary | ICD-10-CM

## 2023-05-25 DIAGNOSIS — S5002XA Contusion of left elbow, initial encounter: Secondary | ICD-10-CM | POA: Diagnosis not present

## 2023-05-25 DIAGNOSIS — I10 Essential (primary) hypertension: Secondary | ICD-10-CM

## 2023-05-25 DIAGNOSIS — S7001XA Contusion of right hip, initial encounter: Secondary | ICD-10-CM | POA: Diagnosis not present

## 2023-05-25 DIAGNOSIS — Z79899 Other long term (current) drug therapy: Secondary | ICD-10-CM | POA: Insufficient documentation

## 2023-05-25 DIAGNOSIS — Y92410 Unspecified street and highway as the place of occurrence of the external cause: Secondary | ICD-10-CM | POA: Diagnosis not present

## 2023-05-25 DIAGNOSIS — Z85850 Personal history of malignant neoplasm of thyroid: Secondary | ICD-10-CM | POA: Diagnosis not present

## 2023-05-25 LAB — CBG MONITORING, ED: Glucose-Capillary: 99 mg/dL (ref 70–99)

## 2023-05-25 MED ORDER — ACETAMINOPHEN 325 MG PO TABS
650.0000 mg | ORAL_TABLET | Freq: Once | ORAL | Status: AC
  Administered 2023-05-25: 650 mg via ORAL
  Filled 2023-05-25: qty 2

## 2023-05-25 MED ORDER — LISINOPRIL 20 MG PO TABS: 30.0000 mg | ORAL_TABLET | Freq: Every day | ORAL | 0 refills | Status: AC

## 2023-05-25 MED ORDER — IBUPROFEN 800 MG PO TABS
800.0000 mg | ORAL_TABLET | Freq: Once | ORAL | Status: AC
  Administered 2023-05-25: 800 mg via ORAL
  Filled 2023-05-25: qty 1

## 2023-05-25 MED ORDER — IBUPROFEN 600 MG PO TABS: 600.0000 mg | ORAL_TABLET | Freq: Four times a day (QID) | ORAL | 0 refills | Status: DC | PRN

## 2023-05-25 MED ORDER — METHOCARBAMOL 500 MG PO TABS: 500.0000 mg | ORAL_TABLET | Freq: Two times a day (BID) | ORAL | 0 refills | Status: DC | PRN

## 2023-05-25 MED ORDER — HYDROCHLOROTHIAZIDE 25 MG PO TABS: 25.0000 mg | ORAL_TABLET | Freq: Every day | ORAL | 0 refills | Status: AC

## 2023-05-25 MED ORDER — METHOCARBAMOL 500 MG PO TABS
750.0000 mg | ORAL_TABLET | Freq: Once | ORAL | Status: AC
  Administered 2023-05-25: 750 mg via ORAL
  Filled 2023-05-25: qty 2
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
## 2023-05-29 ENCOUNTER — Telehealth: Payer: Medicaid Other | Admitting: Plastic Surgery

## 2023-06-02 ENCOUNTER — Encounter: Payer: Self-pay | Admitting: Plastic Surgery

## 2023-06-02 ENCOUNTER — Ambulatory Visit (INDEPENDENT_AMBULATORY_CARE_PROVIDER_SITE_OTHER): Payer: Medicaid Other | Admitting: Plastic Surgery

## 2023-06-02 DIAGNOSIS — M793 Panniculitis, unspecified: Secondary | ICD-10-CM

## 2023-06-02 NOTE — Progress Notes (Signed)
   Subjective:    Patient ID: [name], female    DOB: 1985/08/17, 37 y.o.   MRN: [id number]  The patient is a 37 year old female joining me by phone for further discussion about her abdomen.  She is 5 feet 2 inches tall and weighs 281 pounds.  She has been able to decrease her weight from 300 pounds using phentermine and increasing her activity.  She is being treated for thyroid disease which is also making her weight loss slow.  She is not a smoker and has had 3 C-sections.  She had a thyroidectomy and complains of back pain with frequent rashes and skin breakdown in her folds of her abdomen.  She has tried different techniques without improvement.  Muscle feels intact with no sign of a hernia.  She has not been able to move forward with a gastric bypass.  Patient states she cannot find a doctor who will take Medicaid.  She wants to move ahead with the panniculectomy.  I have concerns about this.      Review of Systems  Constitutional: Negative.   HENT: Negative.    Eyes: Negative.   Respiratory: Negative.    Cardiovascular: Negative.   Gastrointestinal: Negative.   Endocrine: Negative.   Genitourinary: Negative.   Musculoskeletal:  Positive for back pain.  Skin:  Positive for rash.       Objective:   Physical Exam        Assessment & Plan:     ICD-10-CM   1. Panniculitis  M79.3        I connected with  Talitha D Hosein on 06/02/23 by phone and verified that I am speaking with the correct person using two identifiers. The patient was at home and I was at the office.  We spent 5 min in discussion.   Concerned that if the patient moves ahead with the panniculectomy she will not be as satisfied as if she waits to have the gastric bypass first and loses her desired weight.  I have asked Alesha to make a 3 to 4-week televisit with her so we can talk again and to see if we can find a general surgeon who will accept Medicaid for a gastric bypass type surgery.   I  discussed the limitations of evaluation and management by telemedicine. The patient expressed understanding and agreed to proceed.

## 2023-08-11 ENCOUNTER — Other Ambulatory Visit (HOSPITAL_COMMUNITY): Payer: Self-pay

## 2023-08-11 MED ORDER — WEGOVY 0.25 MG/0.5ML ~~LOC~~ SOAJ
0.2500 mg | SUBCUTANEOUS | 0 refills | Status: AC
  Filled 2023-08-11 – 2023-08-21 (×3): qty 2, 28d supply, fill #0

## 2023-08-12 ENCOUNTER — Other Ambulatory Visit (HOSPITAL_COMMUNITY): Payer: Self-pay

## 2023-08-14 ENCOUNTER — Other Ambulatory Visit (HOSPITAL_COMMUNITY): Payer: Self-pay

## 2023-08-17 ENCOUNTER — Other Ambulatory Visit (HOSPITAL_COMMUNITY): Payer: Self-pay

## 2023-08-19 ENCOUNTER — Other Ambulatory Visit (HOSPITAL_COMMUNITY): Payer: Self-pay

## 2023-08-20 ENCOUNTER — Other Ambulatory Visit (HOSPITAL_COMMUNITY): Payer: Self-pay

## 2023-08-21 ENCOUNTER — Other Ambulatory Visit (HOSPITAL_COMMUNITY): Payer: Self-pay

## 2023-09-17 ENCOUNTER — Other Ambulatory Visit (HOSPITAL_COMMUNITY): Payer: Self-pay

## 2023-09-21 ENCOUNTER — Other Ambulatory Visit (HOSPITAL_COMMUNITY): Payer: Self-pay

## 2023-11-17 ENCOUNTER — Telehealth: Payer: Self-pay | Admitting: Plastic Surgery

## 2023-11-17 NOTE — Telephone Encounter (Signed)
 Patient is saying she lost the weight told to her by Dr. Baker Bon and would like to move forward with surgery, please reach out and advise

## 2023-11-18 ENCOUNTER — Ambulatory Visit: Admitting: Physician Assistant

## 2023-11-18 ENCOUNTER — Encounter: Payer: Self-pay | Admitting: Physician Assistant

## 2023-11-18 VITALS — BP 152/112 | HR 99 | Ht 62.0 in | Wt 254.2 lb

## 2023-11-18 DIAGNOSIS — M793 Panniculitis, unspecified: Secondary | ICD-10-CM | POA: Diagnosis not present

## 2023-11-18 NOTE — Progress Notes (Signed)
 Referring Provider Center, [facility] [address] [place],  [place] [address]   CC:  Chief Complaint  Patient presents with   Follow-up      [name] is an 38 y.o. female.  HPI: Patient is a 38 y.o. year old female here for follow up after weight loss for consideration of panniculectomy.  She was seen for initial consult by Dr. Ulice Bold 12/25/2022.  At that time, she was 5 foot 2 inches and weight 281 pounds.  She was taking phentermine for weight loss.  History of 3 C-sections.  Complained of frequent infra pannus rashes and expressed interest in panniculectomy.  Recommended evaluation by general surgery team for bariatric surgery as she will require more weight loss before being a viable candidate.  She then followed up a couple months later and referrals were placed to general surgery and weight management.  She then followed up again 06/02/2023 with Dr. Ulice Bold.  At that time, Dr. Ulice Bold expressed concerns about moving forward with panniculectomy.  She informed the patient that she would be less satisfied moving forward with panniculectomy then if she were to first achieve significant weight loss by means of bariatric surgery.  Evidently there was difficulty finding a general surgeon who would accept her Medicaid insurance.  Today, she has has done an excellent job and has lost considerable weight since last encounter.  She now weighs 254 pounds which translates to a BMI of 46.49 kg/m.  Her BP is also much more controlled at 152/112, significant improvement compared to recent encounters in the chart that reflect a systolic blood pressure greater than 200 mmHg. she states that this is without even taking her antihypertensive and that is typically better controlled.  She tells me that she is seeing [facility] for her weight loss and is on semaglutide injections every Tuesday.  She also receives some sort of pellet injection that is also affiliated with weight  loss.  Her most recent A1c per her primary care provider at [facility] is 6.0%, obtained 06/2023.  She tells me that this panniculectomy will mostly be for functional purposes, but understands that she will see improved cosmesis and have fewer reductions if she continues with her weight loss journey.  Her target weight is 199 pounds.    Review of Systems General: Denies fevers MSK: Endorses ongoing back and neck discomfort Skin: Endorses intermittent infra pannus rashes refractory to medical management  Physical Exam    11/18/2023    2:11 PM 05/25/2023   10:24 PM 05/25/2023    9:15 PM  Vitals with BMI  Height 5\' 2"     Weight 254 lbs 3 oz    BMI 46.48    Systolic 152 207 604  Diastolic 112 128 540  Pulse 99 88 97    General:  No acute distress,  Alert and oriented, Non-Toxic, Normal speech and affect Psych: Normal behavior and mood Respiratory: No increased WOB MSK: Ambulatory  Assessment/Plan  Recurrent panniculitis, morbid obesity with extreme weight loss:  Patient is doing a phenomenal job from a weight loss standpoint.  She reports that she was over 300 pounds not long ago and feels confident that she can continue with her weight loss journey.  Her goal is 199 pounds and advised her to continue working towards that goal.  She seems to be doing well with her [facility].    Follow-up in 3 months for repeat evaluation.  Obtained updated photos today for her  chart.  Picture(s) obtained of the patient and placed in the chart were with the patient's or guardian's permission.   Mariel Shope 11/18/2023, 3:57 PM

## 2023-12-16 ENCOUNTER — Encounter (HOSPITAL_COMMUNITY): Payer: Self-pay

## 2023-12-16 ENCOUNTER — Ambulatory Visit (INDEPENDENT_AMBULATORY_CARE_PROVIDER_SITE_OTHER)

## 2023-12-16 ENCOUNTER — Ambulatory Visit (HOSPITAL_COMMUNITY)
Admission: EM | Admit: 2023-12-16 | Discharge: 2023-12-16 | Disposition: A | Discharge status: Home / Self Care | Attending: Emergency Medicine | Admitting: Emergency Medicine

## 2023-12-16 DIAGNOSIS — B9789 Other viral agents as the cause of diseases classified elsewhere: Secondary | ICD-10-CM

## 2023-12-16 DIAGNOSIS — M79671 Pain in right foot: Secondary | ICD-10-CM | POA: Diagnosis not present

## 2023-12-16 DIAGNOSIS — M25571 Pain in right ankle and joints of right foot: Secondary | ICD-10-CM

## 2023-12-16 DIAGNOSIS — J988 Other specified respiratory disorders: Secondary | ICD-10-CM | POA: Diagnosis not present

## 2023-12-16 MED ORDER — AZELASTINE HCL 0.1 % NA SOLN: 2.0000 | Freq: Two times a day (BID) | NASAL | 0 refills | Status: AC

## 2023-12-16 NOTE — Discharge Instructions (Signed)
 Use azelastine nasal spray twice daily to help with congestion.  You can continue to take over-the-counter sinus relief medication as well.   Your X-ray of your foot and ankle are both negative. You can continue to wear the boot for comfort.  Follow-up with [facility] if your pain continues for further evaluation and management.  You can continue to take 650 mg of Tylenol  every 4-6 hours as needed for pain. You can alternate with with 400 mg of ibuprofen  every 6-8 hours as needed for pain as well. Rest, ice, and elevate your foot as often as possible. Return here as needed.

## 2023-12-16 NOTE — ED Provider Notes (Signed)
 [facility]    CSN: [id number] Arrival date & time: 12/16/23  1439      History   Chief Complaint Chief Complaint  Patient presents with   Foot Pain    HPI Priseis D Bellino is a 38 y.o. female.   Patient presents with mild cough, nasal congestion, and intermittent headache that began on 5/11. Patient states she has been taking OTC sinus relief with some relief, but wants to make sure she does not have a sinus infection. Denies fever, shortness of breath, chest pain, vomiting, diarrhea, and abdominal pain.  Patient also endorses right foot and ankle pain x 3 days. Patient states that she was seen at a different UC on 5/5 after a fall that caused her to have right leg pain. Patient states that her kids were playing with baby oil in the bathroom and she slipped and fell. Patient states that she initially did not have any foot or ankle pain and only her leg was hurting.   Patient presents wearing a cam boot that was provided to her from the UC and states that she was told that she has a small fracture to her leg. Patient states that she does not think they did an X-ray of her foot and is requesting an X-ray today to make sure she does not also have a fracture in her foot or ankle. Patient reports she has been taking Tylenol  with relief of pain.   The history is provided by the patient and medical records.  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    Gestational diabetes 01/08/2020   Abnormal genetic test 01/05/2020   History of marijuana use 01/05/2020   History of severe pre-eclampsia  12/28/2019   Supervision of high risk pregnancy, antepartum 12/15/2019   Obesity, Class III, BMI 40-49.9 (morbid obesity) 12/30/2017   Obesity affecting pregnancy, antepartum 12/30/2017   Hypothyroidism during pregnancy, antepartum 12/30/2017   Chronic hypertension in pregnancy 12/16/2017   History of cesarean delivery 12/16/2017   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   Hematuria 11/11/2017   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    OB History     Gravida  4   Para  2   Term  2   Preterm      AB  1   Living  2      SAB  1   IAB      Ectopic      Multiple  0   Live Births  2  Home Medications    Prior to Admission medications   Medication Sig Start Date End Date Taking? Authorizing Provider  hydrochlorothiazide  (HYDRODIURIL ) 25 MG tablet Take 1 tablet (25 mg total) by mouth daily. 05/25/23  Yes Sueellen Emery, MD  levothyroxine  (SYNTHROID ) 200 MCG tablet Take 400 mcg by mouth daily. 11/20/19  Yes [provider]  lisinopril  (ZESTRIL ) 20 MG tablet Take 1.5 tablets (30 mg total) by mouth daily. 05/25/23  Yes Sueellen Emery, MD  Semaglutide -Weight Management (WEGOVY ) 0.25 MG/0.5ML SOAJ Inject 0.25 mg into the skin once a week. 08/11/23  Yes   azelastine (ASTELIN) 0.1 % nasal spray Place 2 sprays into both nostrils 2 (two) times daily. Use in each nostril as directed 12/16/23  Yes Levora Reas A, NP  benzonatate   (TESSALON ) 100 MG capsule Take 1 capsule (100 mg total) by mouth every 8 (eight) hours. Patient not taking: Reported on 02/25/2023 10/31/22   Starlene Eaton, FNP  famotidine  (PEPCID ) 20 MG tablet Take 1 tablet (20 mg total) by mouth daily. Patient not taking: Reported on 02/25/2023 01/05/20   Jan Mcgill, MD  ferrous sulfate  [facility]) 325 (65 FE) MG tablet Take 1 tablet (325 mg total) by mouth 2 (two) times daily. Patient not taking: Reported on 02/25/2023 12/29/19   Terri Fester, NP  fluticasone  (FLONASE ) 50 MCG/ACT nasal spray Place 1 spray into both nostrils daily. Patient not taking: Reported on 02/25/2023 12/24/21   Starlene Eaton, FNP  Guaifenesin  1200 MG TB12 Take 1 tablet (1,200 mg total) by mouth in the morning and at bedtime. Patient not taking: Reported on 02/25/2023 12/24/21   Starlene Eaton, FNP  ibuprofen  (ADVIL ) 600 MG tablet Take 1 tablet (600 mg total) by mouth every 6 (six) hours as needed. 05/25/23   Sueellen Emery, MD  ibuprofen  (ADVIL ) 800 MG tablet Take 1 tablet (800 mg total) by mouth 3 (three) times daily with meals. 04/02/23   Afton Albright, MD  labetalol  (NORMODYNE ) 200 MG tablet Take 2 tablets (400 mg total) by mouth 2 (two) times daily. Patient not taking: Reported on 02/25/2023 12/15/19   Granville Layer, MD  lisinopril  (ZESTRIL ) 20 MG tablet Take 20 mg by mouth daily. Patient not taking: Reported on 02/25/2023    [provider]  methocarbamol  (ROBAXIN ) 500 MG tablet Take 1 tablet (500 mg total) by mouth 2 (two) times daily as needed for muscle spasms. 05/25/23   Sueellen Emery, MD    Family History Family History  Problem Relation Age of Onset   Hypertension Mother    Blindness Mother        one eye    Social History Social History   Tobacco Use   Smoking status: Never   Smokeless tobacco: Never  Vaping Use   Vaping status: Never Used  Substance Use Topics   Alcohol use: No   Drug use: Not Currently    Types: Marijuana     Comment: last used 05/19/18     Allergies   Patient has no known allergies.   Review of Systems Review of Systems  Per HPI  Physical Exam Triage Vital Signs ED Triage Vitals  Encounter Vitals Group     BP 12/16/23 1524 102/75     Systolic BP Percentile --      Diastolic BP Percentile --      Pulse Rate 12/16/23 1524 90     Resp 12/16/23 1524 18     Temp 12/16/23 1524 98.2 F (36.8 C)  Temp Source 12/16/23 1524 Oral     SpO2 12/16/23 1524 98 %     Weight --      Height --      Head Circumference --      Peak Flow --      Pain Score 12/16/23 1528 9     Pain Loc --      Pain Education --      Exclude from Growth Chart --    No data found.  Updated Vital Signs BP 102/75 (BP Location: Left Arm)   Pulse 90   Temp 98.2 F (36.8 C) (Oral)   Resp 18   LMP 12/03/2023   SpO2 98%   Visual Acuity Right Eye Distance:   Left Eye Distance:   Bilateral Distance:    Right Eye Near:   Left Eye Near:    Bilateral Near:     Physical Exam Vitals and nursing note reviewed.  Constitutional:      General: She is awake. She is not in acute distress.    Appearance: Normal appearance. She is well-developed and well-groomed. She is not ill-appearing.  HENT:     Right Ear: Tympanic membrane, ear canal and external ear normal.     Left Ear: Tympanic membrane, ear canal and external ear normal.     Nose: Congestion and rhinorrhea present.     Mouth/Throat:     Mouth: Mucous membranes are moist.     Pharynx: Posterior oropharyngeal erythema and postnasal drip present. No oropharyngeal exudate.     Tonsils: No tonsillar exudate.  Cardiovascular:     Rate and Rhythm: Normal rate and regular rhythm.  Pulmonary:     Effort: Pulmonary effort is normal.     Breath sounds: Normal breath sounds.  Musculoskeletal:     Right ankle: No swelling. Tenderness present over the lateral malleolus. Normal range of motion.     Right foot: Normal range of motion. Tenderness present. No  swelling.     Comments: Tenderness noted to plantar aspect of midfoot that extends into her heel.   Lymphadenopathy:     Cervical: No cervical adenopathy.  Skin:    General: Skin is warm and dry.  Neurological:     Mental Status: She is alert.  Psychiatric:        Behavior: Behavior is cooperative.      UC Treatments / Results  Labs (all labs ordered are listed, but only abnormal results are displayed) Labs Reviewed - No data to display  EKG   Radiology DG Ankle Complete Right Result Date: 12/16/2023 CLINICAL DATA:  Right foot pain. EXAM: RIGHT ANKLE - COMPLETE 3+ VIEW COMPARISON:  None Available. FINDINGS: There is no evidence of fracture, dislocation, or joint effusion. There is no evidence of arthropathy or other focal bone abnormality. Soft tissues are unremarkable. IMPRESSION: Negative. Electronically Signed   By: Angus Bark M.D.   On: 12/16/2023 16:48   DG Foot Complete Right Result Date: 12/16/2023 CLINICAL DATA:  Foot and ankle pain EXAM: RIGHT FOOT COMPLETE - 3+ VIEW COMPARISON:  None Available. FINDINGS: There is no evidence of fracture or dislocation. There is no evidence of arthropathy or other focal bone abnormality. Soft tissues are unremarkable. IMPRESSION: Negative. Electronically Signed   By: Fredrich Jefferson M.D.   On: 12/16/2023 16:42    Procedures Procedures (including critical care time)  Medications Ordered in UC Medications - No data to display  Initial Impression / Assessment and Plan / UC Course  I  have reviewed the triage vital signs and the nursing notes.  Pertinent labs & imaging results that were available during my care of the patient were reviewed by me and considered in my medical decision making (see chart for details).     Patient is well-appearing.  Vitals are stable.    Cough and congestion: Upon assessment congestion and rhinorrhea are present, mild erythema and PND noted to pharynx.  Discussed symptoms are likely viral in nature.   Prescribed azelastine nasal spray to help with congestion.  Discussed over-the-counter medication as needed for symptoms.  Foot and ankle pain: X-rays ordered.  Based on my interpretation there are no obvious fractures or underlying injuries noted on x-rays.  Radiology report confirms this.  Recommended patient continue to wear the boot as needed for comfort.  Discussed alternate between Tylenol  and ibuprofen  as needed for pain.  Given orthopedic follow-up.  Discussed return precautions.  Final Clinical Impressions(s) / UC Diagnoses   Final diagnoses:  Right foot pain  Acute right ankle pain  Viral respiratory illness     Discharge Instructions      Use azelastine nasal spray twice daily to help with congestion.  You can continue to take over-the-counter sinus relief medication as well.   Your X-ray of your foot and ankle are both negative. You can continue to wear the boot for comfort.  Follow-up with [facility] if your pain continues for further evaluation and management.  You can continue to take 650 mg of Tylenol  every 4-6 hours as needed for pain. You can alternate with with 400 mg of ibuprofen  every 6-8 hours as needed for pain as well. Rest, ice, and elevate your foot as often as possible. Return here as needed.    ED Prescriptions     Medication Sig Dispense Auth. Provider   azelastine (ASTELIN) 0.1 % nasal spray Place 2 sprays into both nostrils 2 (two) times daily. Use in each nostril as directed 30 mL Levora Reas A, NP      PDMP not reviewed this encounter.   Levora Reas A, NP 12/16/23 1827

## 2023-12-16 NOTE — ED Triage Notes (Signed)
 Patient presents to the office right foot pain. Patient states she has a fracture leg. Patient states he foot is hurting and would like an x-ray.  Patient reports sinus pressure and  headache.

## 2024-02-22 NOTE — Progress Notes (Signed)
 Referring Provider Center, [facility] [address] [place],  [place] [address]   CC:  Chief Complaint  Patient presents with   Follow-up      [name] is an 38 y.o. female.  HPI: Patient is a 38 y.o. year old female here for follow up after weight loss for consideration of panniculectomy.  She was seen for initial consult by Dr. Lowery 12/25/2022.  At that time, she was 5 foot 2 inches and weight 281 pounds.  She was taking phentermine for weight loss.  History of 3 C-sections.  Complained of frequent infra pannus rashes and expressed interest in panniculectomy.  Recommended evaluation by general surgery team for bariatric surgery as she will require more weight loss before being a viable candidate.  She then followed up a couple months later and referrals were placed to general surgery and weight management.  She then followed up again 06/02/2023 with Dr. Lowery.  At that time, Dr. Lowery expressed concerns about moving forward with panniculectomy.  She informed the patient that she would be less satisfied moving forward with panniculectomy then if she were to first achieve significant weight loss by means of bariatric surgery.  Evidently there was difficulty finding a general surgeon who would accept her Medicaid insurance.  She then followed up 11/18/2023.  At that time, she was seeing [facility] for weight loss and was on semaglutide  injectables every Tuesday.  She reported that her target weight was 199 pounds.  Overall she had lost over 300 pounds and was confident that she could continue with her weight loss goals.  She weighed 254 pounds.  Today, she tells me that she had to discontinue the GLP-1 injections with [facility] due to medication intolerance.  She states that she had significant nausea that was debilitating and made it difficult to work.  She states that she is in the process of changing primary care providers from [facility] to  [facility].  Phentermine was recently prescribed after discontinuation of the GLP-1 medication, but patient tells me that she has not yet filled it because she is tired of it.  She states that she has been on phentermine for years and going back and forth with weight management for a long time.  She simply wants to move forward with a panniculectomy.  Inquired about the referral to general surgery for consideration of bariatric surgery, but she tells me that she has no interest in pursuing it whatsoever.  Her BP was elevated today at 170/92 mmHg, but denies cardiac symptoms.    Review of Systems General: Denies fevers MSK: Endorses ongoing back and neck discomfort Skin: Reports continued infra pannus rashes and irritation  Physical Exam    02/23/2024    2:08 PM 12/16/2023    3:24 PM 11/18/2023    2:11 PM  Vitals with BMI  Height   5' 2  Weight 254 lbs  254 lbs 3 oz  BMI   46.48  Systolic 170 102 847  Diastolic 92 75 112  Pulse 78 90 99    General:  No acute distress,  Alert and oriented, Non-Toxic, Normal speech and affect Psych: Normal behavior and mood Respiratory: No increased WOB MSK: Ambulatory  Assessment/Plan  Panniculitis, obesity:  Patient remains interested in panniculectomy.  However, she is not medically optimized prior to the elective surgery.  She understands the importance of BP control in addition to continued weight loss.  She feels confident that she can lose the  weight even without the assistance of medication.  While her personal target no longer is 199 pounds, she needs to be at 220 pounds to achieve a BMI of 40 kg/m.  Patient can follow-up once she has achieved that weight at which time we will submit for insurance authorization.  Discussed case with Dr. Lowery who was agreeable with this plan.  Approximately 15 minutes was spent in the room with the patient.  Honora Seip 02/23/2024, 2:53 PM

## 2024-02-23 ENCOUNTER — Ambulatory Visit: Admitting: Physician Assistant

## 2024-02-23 VITALS — BP 170/92 | HR 78 | Wt 254.0 lb

## 2024-02-23 DIAGNOSIS — E669 Obesity, unspecified: Secondary | ICD-10-CM

## 2024-02-23 DIAGNOSIS — M793 Panniculitis, unspecified: Secondary | ICD-10-CM | POA: Diagnosis not present

## 2024-03-02 ENCOUNTER — Encounter (HOSPITAL_COMMUNITY): Payer: Self-pay

## 2024-03-02 ENCOUNTER — Ambulatory Visit (INDEPENDENT_AMBULATORY_CARE_PROVIDER_SITE_OTHER)

## 2024-03-02 ENCOUNTER — Ambulatory Visit (HOSPITAL_COMMUNITY)
Admission: EM | Admit: 2024-03-02 | Discharge: 2024-03-02 | Disposition: A | Discharge status: Home / Self Care | Attending: Emergency Medicine | Admitting: Emergency Medicine

## 2024-03-02 DIAGNOSIS — W19XXXA Unspecified fall, initial encounter: Secondary | ICD-10-CM

## 2024-03-02 DIAGNOSIS — M545 Low back pain, unspecified: Secondary | ICD-10-CM

## 2024-03-02 DIAGNOSIS — S61309A Unspecified open wound of unspecified finger with damage to nail, initial encounter: Secondary | ICD-10-CM

## 2024-03-02 LAB — POCT URINE PREGNANCY: Preg Test, Ur: NEGATIVE

## 2024-03-02 MED ORDER — IBUPROFEN 800 MG PO TABS: 800.0000 mg | ORAL_TABLET | Freq: Three times a day (TID) | ORAL | 0 refills | Status: AC

## 2024-03-02 MED ORDER — KETOROLAC TROMETHAMINE 30 MG/ML IJ SOLN
30.0000 mg | Freq: Once | INTRAMUSCULAR | Status: AC
  Administered 2024-03-02: 30 mg via INTRAMUSCULAR

## 2024-03-02 MED ORDER — MUPIROCIN 2 % EX OINT: 1.0000 | TOPICAL_OINTMENT | Freq: Two times a day (BID) | CUTANEOUS | 0 refills | Status: AC

## 2024-03-02 MED ORDER — KETOROLAC TROMETHAMINE 30 MG/ML IJ SOLN
INTRAMUSCULAR | Status: AC
  Filled 2024-03-02: qty 1

## 2024-03-02 NOTE — ED Triage Notes (Addendum)
 Pt states slipped and fell on tile 3hrs ago. C/o pain to lower back and lt pinky false nail went backwards lifting the real nail. Took tylenol  with no relief.

## 2024-03-02 NOTE — Discharge Instructions (Addendum)
 Your x-ray is negative for any underlying fracture of your spine from the fall. You can take 800 mg ibuprofen  every 8 hours as needed for pain.  You can alternate this with 500 to 1000 mg of Tylenol  every 6-8 hours.  Do not exceed 4000 mg of Tylenol  in a day. You did receive an injection of Toradol  in clinic today for your pain.  Avoid taking any ibuprofen  until at least 8 hours after receiving this injection. Alternate between ice and heat as needed for additional back pain relief. You can follow-up with [facility] for further evaluation and management of your back pain.  Apply mupirocin  ointment twice daily to your finger and keep this area clean and dry and covered to avoid infection. If you notice swelling, spreading of redness, or lots of puslike drainage from the area return here for reevaluation. Follow-up with your primary care provider or return here as needed.

## 2024-03-02 NOTE — ED Provider Notes (Signed)
 [facility]    CSN: [id number] Arrival date & time: 03/02/24  1330      History   Chief Complaint Chief Complaint  Patient presents with   Fall    HPI [name] is a 38 y.o. female.   Patient presents with low back pain and left little finger nail injury after fall that occurred 3 hours prior to arrival.  Patient states that she was in the bathroom and slipped on some tile floor and her back hit the toilet behind her.  Patient denies hitting her head or loss of consciousness.  Patient denies any other injuries from the fall.  Patient states that her fingernail on her artificial fingernail and actual fingernail are both detached from her finger after hitting her finger board during the fall.  Patient denies taking any medication for her symptoms.  Patient denies numbness, tingling, weakness, saddle anesthesia, bowel/bladder incontinence.  The history is provided by the patient and medical records.  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    Gestational diabetes 01/08/2020   Abnormal genetic test 01/05/2020   History of marijuana use 01/05/2020   History of severe pre-eclampsia 12/28/2019   Supervision of high risk pregnancy, antepartum 12/15/2019   Obesity, Class III, BMI 40-49.9 (morbid obesity) 12/30/2017   Obesity affecting pregnancy, antepartum 12/30/2017   Hypothyroidism during pregnancy, antepartum 12/30/2017   Chronic hypertension in pregnancy 12/16/2017   History of cesarean delivery 12/16/2017   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   Hematuria 11/11/2017    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    OB History     Gravida  4   Para  2   Term  2   Preterm      AB  1   Living  2      SAB  1   IAB      Ectopic      Multiple  0   Live Births  2            Home Medications    Prior to Admission medications   Medication Sig Start Date End Date Taking? Authorizing Provider  ibuprofen  (ADVIL ) 800 MG tablet Take 1 tablet (800 mg total) by mouth 3 (three) times daily. 03/02/24  Yes Johnie, Melaya Hoselton A, NP  mupirocin  ointment (BACTROBAN ) 2 % Apply 1 Application topically 2 (two) times daily. 03/02/24  Yes Johnie, Deborh Pense A, NP  azelastine  (ASTELIN ) 0.1 % nasal spray Place 2 sprays into both nostrils 2 (two) times daily. Use  in each nostril as directed 12/16/23   Johnie Flaming A, NP  benzonatate  (TESSALON ) 100 MG capsule Take 1 capsule (100 mg total) by mouth every 8 (eight) hours. Patient not taking: Reported on 02/23/2024 10/31/22   Enedelia Dorna HERO, FNP  famotidine  (PEPCID ) 20 MG tablet Take 1 tablet (20 mg total) by mouth daily. Patient not taking: Reported on 02/23/2024 01/05/20   Nicholaus Burnard HERO, MD  ferrous sulfate  [facility]) 325 (65 FE) MG tablet Take 1 tablet (325 mg total) by mouth 2 (two) times daily. Patient not taking: Reported on 02/23/2024 12/29/19   Jerilynn Longs, NP  fluticasone  (FLONASE ) 50 MCG/ACT nasal spray Place 1 spray into both nostrils daily. Patient not taking: Reported on 02/23/2024  12/24/21   Enedelia Dorna HERO, FNP  Guaifenesin  1200 MG TB12 Take 1 tablet (1,200 mg total) by mouth in the morning and at bedtime. Patient not taking: Reported on 02/23/2024 12/24/21   Enedelia Dorna HERO, FNP  hydrochlorothiazide  (HYDRODIURIL ) 25 MG tablet Take 1 tablet (25 mg total) by mouth daily. 05/25/23   Dean Clarity, MD  labetalol  (NORMODYNE ) 200 MG tablet Take 2 tablets (400 mg total) by mouth 2 (two) times daily. Patient not taking: Reported on 02/23/2024 12/15/19   Fredirick Glenys RAMAN, MD  levothyroxine  (SYNTHROID ) 200 MCG tablet Take 400 mcg by mouth daily. 11/20/19   [provider]  lisinopril  (ZESTRIL ) 20 MG tablet Take 20 mg by mouth daily. Patient not taking: Reported on 02/23/2024    [provider]  lisinopril  (ZESTRIL ) 20 MG tablet Take 1.5 tablets (30 mg total) by mouth daily. 05/25/23   Dean Clarity, MD  methocarbamol  (ROBAXIN ) 500 MG tablet Take 1 tablet (500 mg total) by mouth 2 (two) times daily as needed for muscle spasms. 05/25/23   Dean Clarity, MD  Semaglutide -Weight Management (WEGOVY ) 0.25 MG/0.5ML SOAJ Inject 0.25 mg into the skin once a week. 08/11/23       Family History Family History  Problem Relation Age of Onset   Hypertension Mother    Blindness Mother        one eye    Social History Social History   Tobacco Use   Smoking status: Never   Smokeless tobacco: Never  Vaping Use   Vaping status: Never Used  Substance Use Topics   Alcohol use: No   Drug use: Not Currently    Types: Marijuana    Comment: last used 05/19/18     Allergies   Patient has no known allergies.   Review of Systems Review of Systems  Per HPI  Physical Exam Triage Vital Signs ED Triage Vitals [03/02/24 1359]  Encounter Vitals Group     BP (!) 157/88     Girls Systolic BP Percentile      Girls Diastolic BP Percentile      Boys Systolic BP Percentile      Boys Diastolic BP Percentile      Pulse Rate (!) 107     Resp 18     Temp 98.6 F  (37 C)     Temp Source Oral     SpO2 96 %     Weight      Height      Head Circumference      Peak Flow      Pain Score 10     Pain Loc      Pain Education      Exclude from Growth Chart    No data found.  Updated Vital Signs  BP (!) 157/88 (BP Location: Left Arm)   Pulse (!) 107   Temp 98.6 F (37 C) (Oral)   Resp 18   LMP 02/15/2024 (Approximate)   SpO2 96%   Breastfeeding No   Visual Acuity Right Eye Distance:   Left Eye Distance:   Bilateral Distance:    Right Eye Near:   Left Eye Near:    Bilateral Near:     Physical Exam Vitals and nursing note reviewed.  Constitutional:      General: She is awake. She is not in acute distress.    Appearance: Normal appearance. She is well-developed and well-groomed. She is not ill-appearing.  Musculoskeletal:     Left hand: No swelling, deformity or tenderness. Normal range of motion.     Cervical back: Normal.     Thoracic back: Normal.     Lumbar back: Tenderness and bony tenderness present. No swelling, edema, deformity or signs of trauma. Normal range of motion. Negative right straight leg raise test and negative left straight leg raise test.       Back:     Comments: Tenderness noted to bilateral low back with spinous process tenderness.  Endorses more severe tenderness to the right low back.  Artificial and actual nail have detached from left little finger.  Removed this at bedside without local anesthetic and patient tolerated this well.  Skin:    General: Skin is warm and dry.  Neurological:     Mental Status: She is alert.  Psychiatric:        Behavior: Behavior is cooperative.      UC Treatments / Results  Labs (all labs ordered are listed, but only abnormal results are displayed) Labs Reviewed  POCT URINE PREGNANCY    EKG   Radiology DG Lumbar Spine Complete Result Date: 03/02/2024 CLINICAL DATA:  Fall and back pain. EXAM: LUMBAR SPINE - COMPLETE 4+ VIEW COMPARISON:  Lumbar spine radiograph dated  05/25/2023. FINDINGS: Evaluation is limited due to body habitus and soft tissue attenuation. There is side lumbar type vertebra. No acute fracture or subluxation of the lumbar spine. The vertebral body heights and disc spaces are maintained. The visualized posterior elements are intact. The soft tissues are unremarkable. IMPRESSION: No acute findings. Electronically Signed   By: Vanetta Chou M.D.   On: 03/02/2024 14:51    Procedures Procedures (including critical care time)  Medications Ordered in UC Medications  ketorolac  (TORADOL ) 30 MG/ML injection 30 mg (30 mg Intramuscular Given 03/02/24 1445)    Initial Impression / Assessment and Plan / UC Course  I have reviewed the triage vital signs and the nursing notes.  Pertinent labs & imaging results that were available during my care of the patient were reviewed by me and considered in my medical decision making (see chart for details).     Patient is overall well-appearing.  Vitals are stable.  Removed nail at bedside without local anesthesia and patient tolerated this well.  Provided basic wound care and dressing in clinic.  Discussed proper wound care and dressing related to nail avulsion.  Tenderness noted to bilateral low back with spinous process tenderness.  Ordered x-ray.  Based on my interpretation there are no acute findings on x-ray.  Radiology report confirms this.  Given IM Toradol  in clinic for acute pain.  Prescribed ibuprofen  as needed for pain.  Recommended alternating this with Tylenol  as needed.  Given orthopedic doctor to follow-up with if needed.  Discussed follow-up and return precautions. Final Clinical Impressions(s) /  UC Diagnoses   Final diagnoses:  Acute bilateral low back pain without sciatica  Nail avulsion, finger, initial encounter  Fall, initial encounter     Discharge Instructions      Your x-ray is negative for any underlying fracture of your spine from the fall. You can take 800 mg ibuprofen   every 8 hours as needed for pain.  You can alternate this with 500 to 1000 mg of Tylenol  every 6-8 hours.  Do not exceed 4000 mg of Tylenol  in a day. Alternate between ice and heat as needed for additional back pain relief. You can follow-up with [facility] for further evaluation and management of your back pain.  Apply mupirocin  ointment twice daily to your finger and keep this area clean and dry and covered to avoid infection. If you notice swelling, spreading of redness, or lots of puslike drainage from the area return here for reevaluation. Follow-up with your primary care provider or return here as needed.     ED Prescriptions     Medication Sig Dispense Auth. Provider   ibuprofen  (ADVIL ) 800 MG tablet Take 1 tablet (800 mg total) by mouth 3 (three) times daily. 21 tablet Johnie Flaming A, NP   mupirocin  ointment (BACTROBAN ) 2 % Apply 1 Application topically 2 (two) times daily. 22 g Johnie Flaming A, NP      PDMP not reviewed this encounter.   Johnie Flaming A, NP 03/02/24 1510

## 2024-03-18 ENCOUNTER — Emergency Department (HOSPITAL_COMMUNITY)
Admission: EM | Admit: 2024-03-18 | Discharge: 2024-03-18 | Disposition: A | Discharge status: Home / Self Care | Attending: Emergency Medicine | Admitting: Emergency Medicine

## 2024-03-18 ENCOUNTER — Emergency Department (HOSPITAL_COMMUNITY)

## 2024-03-18 ENCOUNTER — Other Ambulatory Visit: Payer: Self-pay

## 2024-03-18 ENCOUNTER — Encounter (HOSPITAL_COMMUNITY): Payer: Self-pay

## 2024-03-18 DIAGNOSIS — Z7982 Long term (current) use of aspirin: Secondary | ICD-10-CM | POA: Diagnosis not present

## 2024-03-18 DIAGNOSIS — Z794 Long term (current) use of insulin: Secondary | ICD-10-CM | POA: Diagnosis not present

## 2024-03-18 DIAGNOSIS — Z79899 Other long term (current) drug therapy: Secondary | ICD-10-CM | POA: Insufficient documentation

## 2024-03-18 DIAGNOSIS — I160 Hypertensive urgency: Secondary | ICD-10-CM | POA: Insufficient documentation

## 2024-03-18 DIAGNOSIS — Z85850 Personal history of malignant neoplasm of thyroid: Secondary | ICD-10-CM | POA: Insufficient documentation

## 2024-03-18 DIAGNOSIS — M79605 Pain in left leg: Secondary | ICD-10-CM | POA: Diagnosis present

## 2024-03-18 DIAGNOSIS — M79662 Pain in left lower leg: Secondary | ICD-10-CM | POA: Diagnosis not present

## 2024-03-18 DIAGNOSIS — I10 Essential (primary) hypertension: Secondary | ICD-10-CM | POA: Diagnosis not present

## 2024-03-18 LAB — BASIC METABOLIC PANEL WITH GFR
Anion gap: 11 (ref 5–15)
BUN: 16 mg/dL (ref 6–20)
CO2: 22 mmol/L (ref 22–32)
Calcium: 9.1 mg/dL (ref 8.9–10.3)
Chloride: 108 mmol/L (ref 98–111)
Creatinine, Ser: 1.18 mg/dL — ABNORMAL HIGH (ref 0.44–1.00)
GFR, Estimated: >60 mL/min (ref >60)
Glucose, Bld: 104 mg/dL — ABNORMAL HIGH (ref 70–99)
Potassium: 3.7 mmol/L (ref 3.5–5.1)
Sodium: 141 mmol/L (ref 135–145)

## 2024-03-18 LAB — CBC WITH DIFFERENTIAL/PLATELET
Abs Immature Granulocytes: 0.04 K/uL (ref 0.00–0.07)
Basophils Absolute: 0 K/uL (ref 0.0–0.1)
Basophils Relative: 0 %
Eosinophils Absolute: 0 K/uL (ref 0.0–0.5)
Eosinophils Relative: 0 %
HCT: 37.3 % (ref 36.0–46.0)
Hemoglobin: 12.6 g/dL (ref 12.0–15.0)
Immature Granulocytes: 0 %
Lymphocytes Relative: 15 %
Lymphs Abs: 1.7 K/uL (ref 0.7–4.0)
MCH: 31.2 pg (ref 26.0–34.0)
MCHC: 33.8 g/dL (ref 30.0–36.0)
MCV: 92.3 fL (ref 80.0–100.0)
Monocytes Absolute: 0.7 K/uL (ref 0.1–1.0)
Monocytes Relative: 6 %
Neutro Abs: 9.2 K/uL — ABNORMAL HIGH (ref 1.7–7.7)
Neutrophils Relative %: 79 %
Platelets: 265 K/uL (ref 150–400)
RBC: 4.04 MIL/uL (ref 3.87–5.11)
RDW: 13.3 % (ref 11.5–15.5)
WBC: 11.8 K/uL — ABNORMAL HIGH (ref 4.0–10.5)
nRBC: 0 % (ref 0.0–0.2)

## 2024-03-18 LAB — HEPATIC FUNCTION PANEL
ALT: 16 U/L (ref 0–44)
AST: 31 U/L (ref 15–41)
Albumin: 4 g/dL (ref 3.5–5.0)
Alkaline Phosphatase: 50 U/L (ref 38–126)
Bilirubin, Direct: <0.1 mg/dL (ref 0.0–0.2)
Total Bilirubin: 0.7 mg/dL (ref 0.0–1.2)
Total Protein: 7.1 g/dL (ref 6.5–8.1)

## 2024-03-18 LAB — TROPONIN I (HIGH SENSITIVITY)
Troponin I (High Sensitivity): 12 ng/L (ref <18)
Troponin I (High Sensitivity): 11 ng/L (ref <18)

## 2024-03-18 LAB — CBG MONITORING, ED: Glucose-Capillary: 107 mg/dL — ABNORMAL HIGH (ref 70–99)

## 2024-03-18 LAB — HCG, SERUM, QUALITATIVE: Preg, Serum: NEGATIVE

## 2024-03-18 MED ORDER — METHOCARBAMOL 500 MG PO TABS
500.0000 mg | ORAL_TABLET | Freq: Once | ORAL | Status: AC
  Administered 2024-03-18: 500 mg via ORAL
  Filled 2024-03-18: qty 1

## 2024-03-18 MED ORDER — LIDOCAINE 5 % EX PTCH
1.0000 | MEDICATED_PATCH | CUTANEOUS | Status: DC
  Administered 2024-03-18: 1 via TRANSDERMAL
  Filled 2024-03-18: qty 1

## 2024-03-18 MED ORDER — ACETAMINOPHEN 500 MG PO TABS
1000.0000 mg | ORAL_TABLET | Freq: Once | ORAL | Status: AC
  Administered 2024-03-18: 1000 mg via ORAL
  Filled 2024-03-18: qty 2

## 2024-03-18 MED ORDER — METHOCARBAMOL 500 MG PO TABS: 500.0000 mg | ORAL_TABLET | Freq: Two times a day (BID) | ORAL | 0 refills | Status: AC

## 2024-03-18 MED ORDER — OXYCODONE HCL 5 MG PO TABS
5.0000 mg | ORAL_TABLET | Freq: Once | ORAL | Status: AC
  Administered 2024-03-18: 5 mg via ORAL
  Filled 2024-03-18: qty 1

## 2024-03-18 MED ORDER — HYDROMORPHONE HCL 1 MG/ML IJ SOLN: 0.5000 mg | Freq: Once | INTRAMUSCULAR | Status: DC

## 2024-03-18 MED ORDER — HYDRALAZINE HCL 20 MG/ML IJ SOLN
10.0000 mg | Freq: Once | INTRAMUSCULAR | Status: AC
  Administered 2024-03-18: 10 mg via INTRAVENOUS
  Filled 2024-03-18: qty 1

## 2024-03-18 NOTE — ED Notes (Signed)
 Pt would like to go home due to her BP coming down.

## 2024-03-18 NOTE — ED Triage Notes (Signed)
 Patient BIB from UC after checking in for leg pain.  Staff reports unsure if patient passed out or was just causing a scene. Patient reports hx of blood clots but only takes asa for it and feels like her leg is broke and can't walk on it.

## 2024-03-18 NOTE — ED Notes (Signed)
Pt ambulated to restroom, tolerated well.

## 2024-03-18 NOTE — Progress Notes (Signed)
 LLE venous duplex has been completed.  Preliminary results given to Oscar Zelaya, PA-C.    Results can be found under chart review under CV PROC. 03/18/2024 2:20 PM Yenty Bloch RVT, RDMS

## 2024-03-18 NOTE — ED Provider Triage Note (Signed)
 Emergency Medicine Provider Triage Evaluation Note  [name] , a 38 y.o. female  was evaluated in triage.  Pt complains of leg pain.  She reports that she woke up this morning with severe left leg pain.  No prior history of ischemic limb and possible history of DVT but she is unsure.  Denies any blood thinner use but does report that she takes aspirin  daily.  She does also take lisinopril , hydrochlorothiazide , and levothyroxine .  Denies any missed doses of home medications. Does state that she has noticed some blurring of her vision since waking up today as well as chest tightness but no shortness of breath or leg swelling.  Review of Systems  Positive: As above Negative: As above  Physical Exam  BP (!) 186/122 (BP Location: Right Arm)   Pulse 89   Temp 98.9 F (37.2 C) (Oral)   Resp 20   Ht 5' 2 (1.575 m)   Wt 115.2 kg   LMP 02/15/2024 (Approximate)   SpO2 98%   BMI 46.46 kg/m  Gen:   Awake, no distress   Resp:  Normal effort  MSK:   Moves extremities without difficulty  Other:  No focal area of tenderness that I could elicit on the left lower extremity.  Medical Decision Making  Medically screening exam initiated at 12:12 PM.  Appropriate orders placed.  [name] was informed that the remainder of the evaluation will be completed by another provider, this initial triage assessment does not replace that evaluation, and the importance of remaining in the ED until their evaluation is complete.     [name] A, PA-C 03/18/24 1214

## 2024-03-18 NOTE — ED Provider Notes (Signed)
 [name] Provider Note   CSN: [id number] Arrival date & time: 03/18/24  1125     History  Chief Complaint  Patient presents with   Leg Pain   Chest Pain   Headache    [name] is a 38 y.o. female with PMH as listed below who presents with leg pain.  She reports that she woke up this morning with severe left leg pain and had difficulty walking on the left leg. Unsure where exactly the leg is hurting, just states the whole leg hurts and feels like there is something wrong. Started last night and told her boyfriend it felt like a charlie horse.  No prior history of ischemic limb and possible history of DVT but she is unsure.  Denies any blood thinner use but does report that she takes aspirin  daily.  She does also take lisinopril , hydrochlorothiazide  for her high blood pressure and states she took the 30 mg lisinopril  this AM but didn't take the 25 mg HCTZ. Denies any falls/trauma, numbness/tingling, swelling, overlying skin changes, specific joint such as ankle/hip/knee pain.   Does state that she has noticed some blurring of her vision since waking up today as well as chest tightness but no shortness of breath or leg swelling.   Past Medical History:  Diagnosis Date   Anxiety    Cancer (HCC) 02/2012   thyroid  cancer   Hypertension    Preeclampsia in postpartum period 07/08/2018   Severe Pre-eclampsia superimposed on chronic hypertension, delivered 07/01/2018   Thyroid  disease    cancer       Home Medications Prior to Admission medications   Medication Sig Start Date End Date Taking? Authorizing Provider  methocarbamol  (ROBAXIN ) 500 MG tablet Take 1 tablet (500 mg total) by mouth 2 (two) times daily. 03/18/24  Yes Franklyn Sid SAILOR, MD  azelastine  (ASTELIN ) 0.1 % nasal spray Place 2 sprays into both nostrils 2 (two) times daily. Use in each nostril as directed 12/16/23   Johnie Flaming A, NP  benzonatate  (TESSALON ) 100 MG  capsule Take 1 capsule (100 mg total) by mouth every 8 (eight) hours. Patient not taking: Reported on 02/23/2024 10/31/22   Enedelia Dorna HERO, FNP  famotidine  (PEPCID ) 20 MG tablet Take 1 tablet (20 mg total) by mouth daily. Patient not taking: Reported on 02/23/2024 01/05/20   Nicholaus Burnard HERO, MD  ferrous sulfate  (FERROUSUL) 325 (65 FE) MG tablet Take 1 tablet (325 mg total) by mouth 2 (two) times daily. Patient not taking: Reported on 02/23/2024 12/29/19   Jerilynn Longs, NP  fluticasone  (FLONASE ) 50 MCG/ACT nasal spray Place 1 spray into both nostrils daily. Patient not taking: Reported on 02/23/2024 12/24/21   Enedelia Dorna HERO, FNP  Guaifenesin  1200 MG TB12 Take 1 tablet (1,200 mg total) by mouth in the morning and at bedtime. Patient not taking: Reported on 02/23/2024 12/24/21   Enedelia Dorna HERO, FNP  hydrochlorothiazide  (HYDRODIURIL ) 25 MG tablet Take 1 tablet (25 mg total) by mouth daily. 05/25/23   Dean Clarity, MD  ibuprofen  (ADVIL ) 800 MG tablet Take 1 tablet (800 mg total) by mouth 3 (three) times daily. 03/02/24   Johnie Flaming A, NP  labetalol  (NORMODYNE ) 200 MG tablet Take 2 tablets (400 mg total) by mouth 2 (two) times daily. Patient not taking: Reported on 02/23/2024 12/15/19   Fredirick Glenys RAMAN, MD  levothyroxine  (SYNTHROID ) 200 MCG tablet Take 400 mcg by mouth daily. 11/20/19   [provider]  lisinopril  (  ZESTRIL ) 20 MG tablet Take 20 mg by mouth daily. Patient not taking: Reported on 02/23/2024    [provider]  lisinopril  (ZESTRIL ) 20 MG tablet Take 1.5 tablets (30 mg total) by mouth daily. 05/25/23   Dean Clarity, MD  mupirocin  ointment (BACTROBAN ) 2 % Apply 1 Application topically 2 (two) times daily. 03/02/24   Johnie Flaming A, NP  Semaglutide -Weight Management (WEGOVY ) 0.25 MG/0.5ML SOAJ Inject 0.25 mg into the skin once a week. 08/11/23         Allergies    Patient has no known allergies.    Review of Systems   Review of Systems A 10 point  review of systems was performed and is negative unless otherwise reported in HPI.  Physical Exam Updated Vital Signs BP (!) 148/84   Pulse 79   Temp 97.6 F (36.4 C) (Oral)   Resp 19   Ht 5' 2 (1.575 m)   Wt 115.2 kg   LMP 02/15/2024 (Approximate)   SpO2 96%   BMI 46.46 kg/m  Physical Exam General: Normal appearing female, lying in bed.  HEENT: PERRLA, EOMI, sclera anicteric, MMM, trachea midline.   Cardiology: RRR, no murmurs/rubs/gallops.  Resp: Normal respiratory rate and effort. CTAB, no wheezes, rhonchi, crackles.  Abd: Soft, non-tender, non-distended. No rebound tenderness or guarding.  GU: Deferred. MSK: No peripheral edema or signs of trauma. Extremities without deformity or TTP. No cyanosis or clubbing.  No significant tenderness palpation of the left lower extremity.  No point tenderness to the hip knee or ankle.  Ranging the hip and the knee causes pain for the patient whether passive or active.  No joint effusions noted.  No erythema, induration, fluctuance.  No overlying signs of trauma.  Intact DP/PT pulses in bilateral feet.  Compartments soft, nontender Skin: warm, dry.  Neuro: A&Ox4, CNs II-XII grossly intact.  5 out of 5 strength in all extremities.. Sensation grossly intact. Normal speech.  Psych: Normal mood and affect.   ED Results / Procedures / Treatments   Labs (all labs ordered are listed, but only abnormal results are displayed) Labs Reviewed  BASIC METABOLIC PANEL WITH GFR - Abnormal; Notable for the following components:      Result Value   Glucose, Bld 104 (*)    Creatinine, Ser 1.18 (*)    All other components within normal limits  CBC WITH DIFFERENTIAL/PLATELET - Abnormal; Notable for the following components:   WBC 11.8 (*)    Neutro Abs 9.2 (*)    All other components within normal limits  CBG MONITORING, ED - Abnormal; Notable for the following components:   Glucose-Capillary 107 (*)    All other components within normal limits  HCG,  SERUM, QUALITATIVE  HEPATIC FUNCTION PANEL  TROPONIN I (HIGH SENSITIVITY)  TROPONIN I (HIGH SENSITIVITY)    EKG None  Radiology VAS US  LOWER EXTREMITY VENOUS (DVT) (7a-7p) Result Date: 03/18/2024  Lower Venous DVT Study Patient Name:  CARALINA NOP  Date of Exam:   03/18/2024 Medical Rec #: [id number]            Accession #:    [id number] Date of Birth: 01/15/1986            Patient Gender: F Patient Age:   12 years Exam Location:  [facility] Procedure:      VAS US  LOWER EXTREMITY VENOUS (DVT) Referring Phys: OSCAR ZELAYA --------------------------------------------------------------------------------  Indications: Pain.  Performing Technologist: Ezzie Potters RVT, RDMS  Examination Guidelines: A complete evaluation includes B-mode  imaging, spectral Doppler, color Doppler, and power Doppler as needed of all accessible portions of each vessel. Bilateral testing is considered an integral part of a complete examination. Limited examinations for reoccurring indications may be performed as noted. The reflux portion of the exam is performed with the patient in reverse Trendelenburg.  +-----+---------------+---------+-----------+----------+--------------+ RIGHTCompressibilityPhasicitySpontaneityPropertiesThrombus Aging +-----+---------------+---------+-----------+----------+--------------+ CFV  Full           Yes      Yes                                 +-----+---------------+---------+-----------+----------+--------------+   +---------+---------------+---------+-----------+----------+-------------------+ LEFT     CompressibilityPhasicitySpontaneityPropertiesThrombus Aging      +---------+---------------+---------+-----------+----------+-------------------+ CFV      Full           Yes      Yes                                      +---------+---------------+---------+-----------+----------+-------------------+ SFJ      Full                                                              +---------+---------------+---------+-----------+----------+-------------------+ FV Prox  Full           Yes      Yes                                      +---------+---------------+---------+-----------+----------+-------------------+ FV Mid   Full           Yes      Yes                                      +---------+---------------+---------+-----------+----------+-------------------+ FV Distal                                             unable to visualize +---------+---------------+---------+-----------+----------+-------------------+ PFV      Full                                                             +---------+---------------+---------+-----------+----------+-------------------+ POP      Full           Yes      Yes                                      +---------+---------------+---------+-----------+----------+-------------------+ PTV      Full                                                             +---------+---------------+---------+-----------+----------+-------------------+  PERO     Full                                                             +---------+---------------+---------+-----------+----------+-------------------+     Summary: RIGHT: - No evidence of common femoral vein obstruction.   LEFT: - There is no evidence of deep vein thrombosis in the lower extremity, in areas visualized.  - No cystic structure found in the popliteal fossa.  *See table(s) above for measurements and observations. Electronically signed by Penne Colorado MD on 03/18/2024 at 6:54:00 PM.    Final    DG Chest 2 View Result Date: 03/18/2024 CLINICAL DATA:  Chest pain. EXAM: CHEST - 2 VIEW COMPARISON:  June 17, 2022. FINDINGS: The heart size and mediastinal contours are within normal limits. Both lungs are clear. The visualized skeletal structures are unremarkable. IMPRESSION: No active cardiopulmonary disease. Electronically Signed   By: Lynwood Landy Raddle M.D.   On: 03/18/2024 12:14    Procedures Procedures    Medications Ordered in ED Medications  lidocaine  (LIDODERM ) 5 % 1 patch (1 patch Transdermal Patch Applied 03/18/24 1702)  hydrALAZINE  (APRESOLINE ) injection 10 mg (10 mg Intravenous Given 03/18/24 1708)  methocarbamol  (ROBAXIN ) tablet 500 mg (500 mg Oral Given 03/18/24 1702)  oxyCODONE  (Oxy IR/ROXICODONE ) immediate release tablet 5 mg (5 mg Oral Given 03/18/24 1702)  acetaminophen  (TYLENOL ) tablet 1,000 mg (1,000 mg Oral Given 03/18/24 1702)    ED Course/ Medical Decision Making/ A&P                          Medical Decision Making Amount and/or Complexity of Data Reviewed Labs: ordered. Decision-making details documented in ED Course. Radiology: ordered. Decision-making details documented in ED Course.  Risk OTC drugs. Prescription drug management.    This patient presents to the ED for concern of left leg pain, hypertension, this involves an extensive number of treatment options, and is a complaint that carries with it a high risk of complications and morbidity.  I considered the following differential and admission for this acute, potentially life threatening condition.   MDM:    No h/o compartment syndrome. NVI, good distal pulses. No overlying swelling or erythema/induration to indicate any cellulitis. No c/o trauma, no report of trauma, no c/f fracture/dislocation. No c/f arterial ischemia. Consider muscle spasms/MSK pain, especially given that patient states pain yesterday felt like charlie horse. Lower c/f life or limb threatening  emergent cause of her pain now. Patient with difficulty pin-pointing area of pain as well. Will give robaxin , tylenol , oxycodone , heat pack, lidocaine  patch and attempt to ambulate.    Patient is overall well appearing at this time. Blood pressure is 186/122 mmHg then 230/135. Patient states it is because her leg is hurting. She does have HTN but didn't take her hydrochlorothiazide  this  AM. Will give IV hydralazine  to lower BP acutely.   Patient does not have any symptoms on history nor signs on physical exam concerning for end organ damage secondary to hypertension.   Specifically, based up the patient's presentation, the patient is at sufficiently low risk for: -ACS given no CP, no SOB, normal cardio-pulmonary exam. She does report some chest tightness but EKG wo signs of ischemia and trop neg x2 -Patient does report some  blurring of her vision which improved w/ BP control. She has no headache or other FNDs to indicate ICH or acute intracranial emergency. She is offered further w/u but elects tob e discharged. 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        LEFT:     - There is no evidence of deep vein thrombosis in the lower extremity, in areas visualized.   - No cystic structure found in the popliteal fossa.   *See table(s) above for measurements and observations.   [HN]  1546 DG Chest 2 View No active cardiopulmonary disease. [HN]  1735 Troponin I (High Sensitivity): 12 neg [HN]  1741 Troponin I (High Sensitivity): 11 Neg x2 [HN]  1816 BP(!): 191/107 [HN]  1922 Patient reevaluated.  She is tearful, stating that the nurse was talking shit about her outside the room.  Patient states she is ready to be discharged. She did ambulate to and from the restroom and states after medication she is moving her leg much easier and feels improved.  Instructed the patient to take 1000 mg Tylenol  every 8 hours and Robaxin  5 mg every 12 as needed for muscle spasm.  Patient is offered further workup for her blurry vision and hypertension but she elects to be discharged.  Advised that she take her blood pressure medication as prescribed and follow-up  with her PCP within 1 to 2 weeks.  Overall lower concern for hypertensive emergency and patient has no focal neurodeficits at this time on exam.  Will DC with PCP follow-up, strict discharge instructions and return precautions.  All questions answered to patient satisfaction. [HN]    Clinical Course User Index [HN] Franklyn Sid SAILOR, MD    Labs: I Ordered, and personally interpreted labs.  The pertinent results include:  those listed above  Imaging Studies ordered: DVT US  ordered from triage I independently visualized and interpreted imaging. I agree with the radiologist interpretation  Additional history obtained from chart review, boyfriend at bedside.    Cardiac Monitoring: The patient was maintained on a cardiac monitor.  I personally viewed and interpreted the cardiac monitored which showed an underlying rhythm of: NSR  Reevaluation: After the interventions noted above, I reevaluated the patient and found that they have :improved  Social Determinants of Health: Lives independently  Disposition:  DC  Co morbidities that complicate the patient evaluation  Past Medical History:  Diagnosis Date   Anxiety    Cancer (HCC) 02/2012   thyroid  cancer   Hypertension    Preeclampsia in postpartum period 07/08/2018   Severe Pre-eclampsia superimposed on chronic hypertension, delivered 07/01/2018   Thyroid  disease    cancer     Medicines Meds ordered this encounter  Medications   hydrALAZINE  (APRESOLINE ) injection 10 mg   DISCONTD: HYDROmorphone  (DILAUDID ) injection 0.5 mg   methocarbamol  (ROBAXIN ) tablet 500 mg   oxyCODONE  (Oxy IR/ROXICODONE ) immediate release tablet 5 mg    Refill:  0   acetaminophen  (TYLENOL ) tablet 1,000 mg   lidocaine  (LIDODERM ) 5 % 1 patch   methocarbamol  (ROBAXIN ) 500 MG tablet    Sig: Take 1 tablet (500 mg total) by mouth 2 (two) times daily.    Dispense:  20 tablet    Refill:  0    I have reviewed the  patients home medicines and have made  adjustments as needed  Problem List / ED Course: Problem List Items Addressed This Visit   None Visit Diagnoses       Hypertensive urgency    -  Primary   Relevant Medications   hydrALAZINE  (APRESOLINE ) injection 10 mg (Completed)     Left leg pain                       This note was created using dictation software, which may contain spelling or grammatical errors.    Franklyn Sid SAILOR, MD 03/18/24 [phone]

## 2024-03-18 NOTE — ED Notes (Signed)
 Pt states she has floaters/spots in her vision, and seeing double. MD notified.

## 2024-03-18 NOTE — Discharge Instructions (Signed)
 Thank you for coming to [facility] Emergency Department. You were seen for left leg pain and high blood pressure. We did an exam, labs, and imaging, and these showed high blood pressure and likely left leg muscle pain. Please take tylenol  1,000 mg every 8 hours and robaxin  500 mg twice per day as needed for muscle spasms.  Please take your blood pressure medicine as prescribed.  If you experience any chest pain, shortness of breath, visual symptoms such as double vision or blurry vision, numbness tingling, blood in the urine, or strokelike symptoms, please return to the emergency department.  Long-term effects of high blood pressure can include heart attack, stroke, blindness.    Please follow-up with your primary care physician within 1 to 2 weeks.  Do not hesitate to return to the ED or call 911 if you experience: -Worsening symptoms -Chest pain, shortness of breath -Visual changes -Lightheadedness, passing out -Fevers/chills -Anything else that concerns you

## 2024-04-25 ENCOUNTER — Ambulatory Visit: Admitting: Student

## 2024-04-25 NOTE — Progress Notes (Deleted)
   Referring Provider No referring provider defined for this encounter.   CC: No chief complaint on file.     [name] is an 38 y.o. female.  HPI: Patient is a 38 y.o. year old female here for follow up after weight loss for consideration of panniculectomy.   She was seen for initial consult by Dr. Lowery 12/25/2022.  At that time, she was 5 foot 2 inches and weight 281 pounds.  She was taking phentermine for weight loss.  History of 3 C-sections.  Complained of frequent infra pannus rashes and expressed interest in panniculectomy.  Recommended evaluation by general surgery team for bariatric surgery as she will require more weight loss before being a viable candidate.  She then followed up a couple months later and referrals were placed to general surgery and weight management.  She then followed up again 06/02/2023 with Dr. Lowery.  At that time, Dr. Lowery expressed concerns about moving forward with panniculectomy.  She informed the patient that she would be less satisfied moving forward with panniculectomy then if she were to first achieve significant weight loss by means of bariatric surgery.  Evidently there was difficulty finding a general surgeon who would accept her Medicaid insurance.  Patient was last seen in the clinic on 11/18/2023.  At this visit, patient had lost a considerable amount of weight since her previous encounter.  She at this visit weighed 254 pounds which translated to a BMI of 46.49 kg/m.  Her blood pressure was much more controlled as well.  At this visit, her most recent A1c had been 6.0 obtained in November 2024.  Patient reported that her target weight was 199 pounds.  It was recommended that patient continue towards her goal of 199 pounds.  Today,  Review of Systems General: ***  Physical Exam    03/18/2024    7:00 PM 03/18/2024    6:45 PM 03/18/2024    6:15 PM  Vitals with BMI  Systolic 148 162 815  Diastolic 84 84 89  Pulse 79 74 73     General:  No acute distress,  Alert and oriented, Non-Toxic, Normal speech and affect ***   Assessment/Plan ***  [name] 04/25/2024, 8:13 AM

## 2024-10-18 ENCOUNTER — Ambulatory Visit: Admitting: Plastic Surgery
